# Patient Record
Sex: Female | Born: 1960
Health system: Southern US, Community
[De-identification: ages and names within clinical notes are randomized; demographics above are authoritative.]

## PROBLEM LIST (undated history)

## (undated) DIAGNOSIS — I10 Essential (primary) hypertension: Secondary | ICD-10-CM

## (undated) DIAGNOSIS — R0981 Nasal congestion: Secondary | ICD-10-CM

## (undated) DIAGNOSIS — R338 Other retention of urine: Secondary | ICD-10-CM

## (undated) DIAGNOSIS — Z973 Presence of spectacles and contact lenses: Secondary | ICD-10-CM

## (undated) DIAGNOSIS — I82401 Acute embolism and thrombosis of unspecified deep veins of right lower extremity: Secondary | ICD-10-CM

## (undated) DIAGNOSIS — K439 Ventral hernia without obstruction or gangrene: Secondary | ICD-10-CM

## (undated) DIAGNOSIS — M1990 Unspecified osteoarthritis, unspecified site: Secondary | ICD-10-CM

## (undated) DIAGNOSIS — Z862 Personal history of diseases of the blood and blood-forming organs and certain disorders involving the immune mechanism: Secondary | ICD-10-CM

## (undated) DIAGNOSIS — Z86711 Personal history of pulmonary embolism: Secondary | ICD-10-CM

## (undated) DIAGNOSIS — R350 Frequency of micturition: Secondary | ICD-10-CM

## (undated) DIAGNOSIS — J189 Pneumonia, unspecified organism: Secondary | ICD-10-CM

## (undated) DIAGNOSIS — Z86718 Personal history of other venous thrombosis and embolism: Secondary | ICD-10-CM

## (undated) HISTORY — PX: ESOPHAGOGASTRODUODENOSCOPY: SHX1529

## (undated) HISTORY — DX: Acute embolism and thrombosis of unspecified deep veins of right lower extremity: I82.401

## (undated) HISTORY — DX: Other retention of urine: R33.8

## (undated) HISTORY — PX: ARTHROSCOPY KNEE W/ DRILLING: SUR92

## (undated) HISTORY — PX: COLONOSCOPY: SHX174

## (undated) HISTORY — DX: Personal history of pulmonary embolism: Z86.711

---

## 1997-06-22 ENCOUNTER — Emergency Department (HOSPITAL_COMMUNITY): Admission: EM | Admit: 1997-06-22 | Discharge: 1997-06-22 | Payer: Self-pay | Admitting: Emergency Medicine

## 1999-05-02 ENCOUNTER — Emergency Department (HOSPITAL_COMMUNITY): Admission: EM | Admit: 1999-05-02 | Discharge: 1999-05-02 | Payer: Self-pay | Admitting: Emergency Medicine

## 1999-05-02 ENCOUNTER — Encounter: Payer: Self-pay | Admitting: Emergency Medicine

## 2000-06-28 ENCOUNTER — Encounter: Payer: Self-pay | Admitting: Family Medicine

## 2000-06-28 ENCOUNTER — Encounter: Admission: RE | Admit: 2000-06-28 | Discharge: 2000-06-28 | Payer: Self-pay | Admitting: Family Medicine

## 2001-01-13 ENCOUNTER — Emergency Department (HOSPITAL_COMMUNITY): Admission: EM | Admit: 2001-01-13 | Discharge: 2001-01-13 | Payer: Self-pay | Admitting: Emergency Medicine

## 2001-04-14 ENCOUNTER — Emergency Department (HOSPITAL_COMMUNITY): Admission: EM | Admit: 2001-04-14 | Discharge: 2001-04-14 | Payer: Self-pay | Admitting: Emergency Medicine

## 2001-04-14 ENCOUNTER — Encounter: Payer: Self-pay | Admitting: Emergency Medicine

## 2001-06-09 ENCOUNTER — Ambulatory Visit (HOSPITAL_BASED_OUTPATIENT_CLINIC_OR_DEPARTMENT_OTHER): Admission: RE | Admit: 2001-06-09 | Discharge: 2001-06-09 | Payer: Self-pay | Admitting: Orthopedic Surgery

## 2003-03-26 ENCOUNTER — Encounter: Admission: RE | Admit: 2003-03-26 | Discharge: 2003-03-26 | Payer: Self-pay | Admitting: Family Medicine

## 2003-04-15 ENCOUNTER — Other Ambulatory Visit: Admission: RE | Admit: 2003-04-15 | Discharge: 2003-04-15 | Payer: Self-pay | Admitting: Family Medicine

## 2004-03-28 ENCOUNTER — Encounter: Admission: RE | Admit: 2004-03-28 | Discharge: 2004-03-28 | Payer: Self-pay | Admitting: Family Medicine

## 2005-04-06 ENCOUNTER — Encounter: Admission: RE | Admit: 2005-04-06 | Discharge: 2005-04-06 | Payer: Self-pay | Admitting: Family Medicine

## 2005-07-04 ENCOUNTER — Other Ambulatory Visit: Admission: RE | Admit: 2005-07-04 | Discharge: 2005-07-04 | Payer: Self-pay | Admitting: Family Medicine

## 2006-03-09 ENCOUNTER — Emergency Department (HOSPITAL_COMMUNITY): Admission: EM | Admit: 2006-03-09 | Discharge: 2006-03-09 | Payer: Self-pay | Admitting: Emergency Medicine

## 2006-03-14 ENCOUNTER — Encounter: Admission: RE | Admit: 2006-03-14 | Discharge: 2006-03-14 | Payer: Self-pay | Admitting: Physician Assistant

## 2006-04-24 ENCOUNTER — Encounter: Admission: RE | Admit: 2006-04-24 | Discharge: 2006-04-24 | Payer: Self-pay | Admitting: Family Medicine

## 2006-05-08 ENCOUNTER — Encounter: Admission: RE | Admit: 2006-05-08 | Discharge: 2006-05-08 | Payer: Self-pay | Admitting: Family Medicine

## 2006-08-01 ENCOUNTER — Encounter: Admission: RE | Admit: 2006-08-01 | Discharge: 2006-08-01 | Payer: Self-pay | Admitting: Family Medicine

## 2006-08-05 ENCOUNTER — Other Ambulatory Visit: Admission: RE | Admit: 2006-08-05 | Discharge: 2006-08-05 | Payer: Self-pay | Admitting: Family Medicine

## 2006-11-07 ENCOUNTER — Encounter: Admission: RE | Admit: 2006-11-07 | Discharge: 2006-11-07 | Payer: Self-pay | Admitting: Family Medicine

## 2007-02-24 ENCOUNTER — Ambulatory Visit (HOSPITAL_BASED_OUTPATIENT_CLINIC_OR_DEPARTMENT_OTHER): Admission: RE | Admit: 2007-02-24 | Discharge: 2007-02-24 | Payer: Self-pay | Admitting: Orthopedic Surgery

## 2007-04-10 ENCOUNTER — Encounter: Admission: RE | Admit: 2007-04-10 | Discharge: 2007-04-10 | Payer: Self-pay | Admitting: Family Medicine

## 2007-07-09 ENCOUNTER — Encounter: Admission: RE | Admit: 2007-07-09 | Discharge: 2007-07-09 | Payer: Self-pay | Admitting: Family Medicine

## 2007-08-20 ENCOUNTER — Other Ambulatory Visit: Admission: RE | Admit: 2007-08-20 | Discharge: 2007-08-20 | Payer: Self-pay | Admitting: Family Medicine

## 2008-03-28 ENCOUNTER — Emergency Department (HOSPITAL_COMMUNITY): Admission: EM | Admit: 2008-03-28 | Discharge: 2008-03-28 | Payer: Self-pay | Admitting: Family Medicine

## 2008-06-19 ENCOUNTER — Emergency Department (HOSPITAL_COMMUNITY): Admission: EM | Admit: 2008-06-19 | Discharge: 2008-06-19 | Payer: Self-pay | Admitting: Family Medicine

## 2008-07-20 ENCOUNTER — Encounter: Admission: RE | Admit: 2008-07-20 | Discharge: 2008-07-20 | Payer: Self-pay | Admitting: Family Medicine

## 2008-08-19 ENCOUNTER — Other Ambulatory Visit: Admission: RE | Admit: 2008-08-19 | Discharge: 2008-08-19 | Payer: Self-pay | Admitting: Family Medicine

## 2009-05-15 ENCOUNTER — Emergency Department (HOSPITAL_COMMUNITY): Admission: EM | Admit: 2009-05-15 | Discharge: 2009-05-15 | Payer: Self-pay | Admitting: Family Medicine

## 2009-07-25 ENCOUNTER — Encounter: Admission: RE | Admit: 2009-07-25 | Discharge: 2009-07-25 | Payer: Self-pay | Admitting: Family Medicine

## 2009-08-22 ENCOUNTER — Other Ambulatory Visit: Admission: RE | Admit: 2009-08-22 | Discharge: 2009-08-22 | Payer: Self-pay | Admitting: Family Medicine

## 2010-01-06 ENCOUNTER — Emergency Department (HOSPITAL_COMMUNITY)
Admission: EM | Admit: 2010-01-06 | Discharge: 2010-01-06 | Payer: Self-pay | Source: Home / Self Care | Admitting: Emergency Medicine

## 2010-03-02 ENCOUNTER — Emergency Department (HOSPITAL_COMMUNITY)
Admission: EM | Admit: 2010-03-02 | Discharge: 2010-03-03 | Payer: Self-pay | Source: Home / Self Care | Admitting: Emergency Medicine

## 2010-03-02 LAB — URINALYSIS, ROUTINE W REFLEX MICROSCOPIC
Bilirubin Urine: NEGATIVE
Hgb urine dipstick: NEGATIVE
Ketones, ur: NEGATIVE mg/dL
Nitrite: POSITIVE — AB
Protein, ur: NEGATIVE mg/dL
Specific Gravity, Urine: 1.018 (ref 1.005–1.030)
Urine Glucose, Fasting: NEGATIVE mg/dL
Urobilinogen, UA: 1 mg/dL (ref 0.0–1.0)
pH: 6.5 (ref 5.0–8.0)

## 2010-03-02 LAB — CBC
HCT: 34.2 % — ABNORMAL LOW (ref 36.0–46.0)
Hemoglobin: 10.7 g/dL — ABNORMAL LOW (ref 12.0–15.0)
MCH: 28.9 pg (ref 26.0–34.0)
MCHC: 31.3 g/dL (ref 30.0–36.0)
MCV: 92.4 fL (ref 78.0–100.0)
Platelets: 191 10*3/uL (ref 150–400)
RBC: 3.7 MIL/uL — ABNORMAL LOW (ref 3.87–5.11)
RDW: 12.3 % (ref 11.5–15.5)
WBC: 6.8 10*3/uL (ref 4.0–10.5)

## 2010-03-02 LAB — BASIC METABOLIC PANEL
BUN: 11 mg/dL (ref 6–23)
CO2: 25 mEq/L (ref 19–32)
Calcium: 8.8 mg/dL (ref 8.4–10.5)
Chloride: 106 mEq/L (ref 96–112)
Creatinine, Ser: 0.76 mg/dL (ref 0.4–1.2)
GFR calc Af Amer: >60 mL/min (ref >60)
GFR calc non Af Amer: >60 mL/min (ref >60)
Glucose, Bld: 96 mg/dL (ref 70–99)
Potassium: 3.9 mEq/L (ref 3.5–5.1)
Sodium: 138 mEq/L (ref 135–145)

## 2010-03-02 LAB — DIFFERENTIAL
Basophils Absolute: 0.1 10*3/uL (ref 0.0–0.1)
Basophils Relative: 1 % (ref 0–1)
Eosinophils Absolute: 0.1 10*3/uL (ref 0.0–0.7)
Eosinophils Relative: 2 % (ref 0–5)
Lymphocytes Relative: 42 % (ref 12–46)
Lymphs Abs: 2.9 10*3/uL (ref 0.7–4.0)
Monocytes Absolute: 0.6 10*3/uL (ref 0.1–1.0)
Monocytes Relative: 8 % (ref 3–12)
Neutro Abs: 3.2 10*3/uL (ref 1.7–7.7)
Neutrophils Relative %: 47 % (ref 43–77)

## 2010-03-02 LAB — POCT CARDIAC MARKERS
CKMB, poc: <1 ng/mL — ABNORMAL LOW (ref 1.0–8.0)
Myoglobin, poc: 32.5 ng/mL (ref 12–200)
Troponin i, poc: <0.05 ng/mL (ref 0.00–0.09)

## 2010-03-02 LAB — POCT PREGNANCY, URINE: Preg Test, Ur: NEGATIVE

## 2010-03-02 LAB — URINE MICROSCOPIC-ADD ON

## 2010-03-03 LAB — D-DIMER, QUANTITATIVE: D-Dimer, Quant: 0.51 ug/mL-FEU — ABNORMAL HIGH (ref 0.00–0.48)

## 2010-04-18 LAB — POCT URINALYSIS DIPSTICK
Bilirubin Urine: NEGATIVE
Glucose, UA: NEGATIVE mg/dL
Hgb urine dipstick: NEGATIVE
Ketones, ur: NEGATIVE mg/dL
Nitrite: NEGATIVE
Protein, ur: NEGATIVE mg/dL
Specific Gravity, Urine: 1.025 (ref 1.005–1.030)
Urobilinogen, UA: 0.2 mg/dL (ref 0.0–1.0)
pH: 6.5 (ref 5.0–8.0)

## 2010-04-18 LAB — POCT PREGNANCY, URINE: Preg Test, Ur: NEGATIVE

## 2010-04-26 LAB — POCT URINALYSIS DIP (DEVICE)
Glucose, UA: NEGATIVE mg/dL
Ketones, ur: NEGATIVE mg/dL
Nitrite: POSITIVE — AB
Protein, ur: 30 mg/dL — AB
Specific Gravity, Urine: 1.02 (ref 1.005–1.030)
Urobilinogen, UA: 4 mg/dL — ABNORMAL HIGH (ref 0.0–1.0)
pH: 5.5 (ref 5.0–8.0)

## 2010-04-26 LAB — POCT PREGNANCY, URINE: Preg Test, Ur: NEGATIVE

## 2010-05-13 ENCOUNTER — Inpatient Hospital Stay (INDEPENDENT_AMBULATORY_CARE_PROVIDER_SITE_OTHER)
Admission: RE | Admit: 2010-05-13 | Discharge: 2010-05-13 | Disposition: A | Discharge status: Home / Self Care | Payer: BLUE CROSS/BLUE SHIELD | Source: Ambulatory Visit | Attending: Emergency Medicine | Admitting: Emergency Medicine

## 2010-05-13 DIAGNOSIS — J029 Acute pharyngitis, unspecified: Secondary | ICD-10-CM

## 2010-05-13 DIAGNOSIS — B9789 Other viral agents as the cause of diseases classified elsewhere: Secondary | ICD-10-CM

## 2010-05-13 DIAGNOSIS — J019 Acute sinusitis, unspecified: Secondary | ICD-10-CM

## 2010-05-13 LAB — POCT RAPID STREP A (OFFICE): Streptococcus, Group A Screen (Direct): NEGATIVE

## 2010-06-20 NOTE — Op Note (Signed)
NAMEKASSITY, WOODSON              ACCOUNT NO.:  [id number]   MEDICAL RECORD NO.:  [id number]          PATIENT TYPE:  AMB   LOCATION:  DSC                          FACILITY:  [facility]   PHYSICIAN:  Feliberto Gottron. Turner Daniels, M.D.   DATE OF BIRTH:  August 28, 1960   DATE OF PROCEDURE:  02/24/2007  DATE OF DISCHARGE:                               OPERATIVE REPORT   PREOPERATIVE DIAGNOSIS:  Left knee medial meniscal tear, possible  lateral meniscal tear.   POSTOPERATIVE DIAGNOSIS:  Left knee medial meniscal tear, possible  lateral meniscal tear.  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   PROCEDURE:  Arthroscopic partial medial and lateral meniscectomies;  debridement of chondromalacia trochlea focal grade 4, medial and lateral  femoral condyle grade 3.   SURGEON:  Feliberto Gottron. Turner Daniels, M.D.   FIRST ASSISTANT:  None.   ANESTHETIC:  General LMA.   ESTIMATED BLOOD LOSS:  Minimal.   FLUID REPLACEMENT:  900 mL crystalloid.   DRAINS PLACED:  None.   TOURNIQUET TIME:  None.   INDICATIONS FOR PROCEDURE:  A 50 year old woman with symptomatic  catching, popping and pain in her left knee, who has failed conservative  treatment and desires elective arthroscopic evaluation and treatment for  presumed medial and lateral meniscal tears.  The risks and benefits of  surgery discussed, questions answered.   DESCRIPTION OF PROCEDURE:  The patient identified by armband, taken the  operating room at [facility], appropriate anesthetic  monitors were attached and general LMA anesthesia induced with the  patient in the supine position.  A lateral post was applied to the table  and the left lower extremity prepped and draped in the usual sterile  fashion from the ankle to the midthigh.  The inferomedial and  inferolateral peripatellar regions were then  infiltrated with 3-4 mL of  0.5% Marcaine and epinephrine solution and another 10 mL placed into the  joint with an 18-gauge needle.  Using a #11 blade after doing an  operative time-out, the standard inferomedial and inferolateral  peripatellar portals were then made allowing introduction of the  arthroscope through the inferolateral portal and the outflow through the  inferomedial portal.  The patella had very mild chondromalacia as did  the superior trochlea.  The inferior trochlea near the notch had focal  grade 4 and some grade 3 flap tears that were debrided back to stable  margins using a 3.5 gator sucker shaver.  Moving to the medial  compartment the patient had an area of focal grade 4 chondromalacia near  the medial meniscus medial horn to the medial tibial plateau and the  medial femoral condyle.  The meniscus itself was shredded and debrided  back to a stable margin with the 3.5 gator sucker shaver as well as a  straight biter.  The ACL and the PCL were intact.  On the lateral side  the patient had extensive degenerative tearing of the lateral meniscus  debrided  back to a stable margin, grade 3 chondromalacia of the lateral  femoral condyle was lightly debrided as was the medial femoral condyle.  The gutters were cleared medially and laterally, and the knee irrigated  out with normal saline solution.  At this point the arthroscopic  instruments were removed; a dressing of Xeroform, 4x4 dressing sponges,  Webril and an Ace wrap were applied.  The patient awakened and taken to  the recovery room without difficulty.      Feliberto Gottron. Turner Daniels, M.D.  Electronically Signed     FJR/MEDQ  D:  02/24/2007  T:  02/25/2007  Job:  [id number]

## 2010-06-23 NOTE — Op Note (Signed)
[place]  Patient:    Brandi Rivas, Brandi Rivas Visit Number: [id number] MRN: [id number]          Service Type: DSU Location: [facility] Attending Physician:  Alinda Deem Dictated by:   Alinda Deem, M.D. Proc. Date: 06/09/01 Admit Date:  06/09/2001                             Operative Report  PREOPERATIVE DIAGNOSIS:  Chondromalacia of the right knee with posterior horn medial meniscal tear.  POSTOPERATIVE DIAGNOSIS:  Chondromalacia of the right knee with posterior horn medial meniscal tear.  OPERATION PERFORMED:  Right knee arthroscopic debridement of grade 2 to 3 chondromalacia of the medial femoral condyle, grade 3 to 4 of the trochlea, partial arthroscopic medial meniscectomy.  SURGEON:  Alinda Deem, M.D.  ASSISTANT:  Dorthula Matas, P.A.-C.  ANESTHESIA:  General LMA.  ESTIMATED BLOOD LOSS:  Minimal.  FLUID REPLACEMENT:  800 cc of crystalloid.  DRAINS:  None.  TOURNIQUET TIME:  None.  INDICATIONS FOR PROCEDURE:  The patient is a 50 year old woman who underwent arthroscopic debridement of chondromalacia by me six years ago and now has recurrent pain over the last few weeks that is increasing.  MRI scan showed tricompartmental arthritis, posterior horn medial meniscal tear and she desires arthroscopic evaluation and treatment of her knee pain which is primarily posterior to posteromedial with a catching sensation.  DESCRIPTION OF PROCEDURE:  The patient was identified by arm band and taken to the operating room at [facility] where the appropriate anesthetic monitors were attached and general endotracheal anesthesia induced with the patient in the supine position.  A lateral post applied to the table and the right lower extremity prepped and draped in the usual sterile fashion from the ankle to the midthigh.  The inferomedial and inferolateral peripatellar portal regions were infiltrated with 3 to 4 cc  of 0.5% Marcaine with epinephrine solution.  Standard portals were then made with a #11 blade allowing introduction of the arthroscope through the inferolateral portal and the outflow through the inferomedial portal.  The patella was in relatively good shape with minimal chondromalacia.  The trochlea had grade 3 to 4 chondromalacia focal with flap tears.  This was debrided on the medial side. There was generalized grade 2 to 3 chondromalacia of the medial femoral condyle which was likewise debrided.  The root of the posterior horn of the medial meniscus had some degenerative tearing which was debrided back to stable margins with a 3.5 gator sucker shaver and small cartilaginous loose bodies were also removed.  The ACL and the PCL were intact.  On the lateral side there was some minimal fraying of the lateral meniscus that was incidentally debrided.  The knee was then washed out with normal saline solution.  The arthroscopic instruments were removed.  A dressing of Xeroform, 4 x 4 dressing sponges, Webril and an Ace wrap applied.  The patient was awakened and taken to the recovery room without difficulty. Dictated by:   Alinda Deem, M.D. Attending Physician:  Alinda Deem DD:  06/09/01 TD:  06/09/01 Job: [id number]

## 2010-06-26 ENCOUNTER — Other Ambulatory Visit: Payer: Self-pay | Admitting: Family Medicine

## 2010-06-26 DIAGNOSIS — Z1231 Encounter for screening mammogram for malignant neoplasm of breast: Secondary | ICD-10-CM

## 2010-07-31 ENCOUNTER — Ambulatory Visit: Payer: BLUE CROSS/BLUE SHIELD

## 2010-07-31 ENCOUNTER — Ambulatory Visit
Admission: RE | Admit: 2010-07-31 | Discharge: 2010-07-31 | Disposition: A | Discharge status: Home / Self Care | Payer: BLUE CROSS/BLUE SHIELD | Source: Ambulatory Visit | Attending: Family Medicine | Admitting: Family Medicine

## 2010-07-31 DIAGNOSIS — Z1231 Encounter for screening mammogram for malignant neoplasm of breast: Secondary | ICD-10-CM

## 2010-09-29 ENCOUNTER — Other Ambulatory Visit (HOSPITAL_COMMUNITY)
Admission: RE | Admit: 2010-09-29 | Discharge: 2010-09-29 | Disposition: A | Discharge status: Home / Self Care | Payer: BC Managed Care – PPO | Source: Ambulatory Visit | Attending: Family Medicine | Admitting: Family Medicine

## 2010-09-29 ENCOUNTER — Other Ambulatory Visit: Payer: Self-pay | Admitting: Family Medicine

## 2010-09-29 DIAGNOSIS — Z124 Encounter for screening for malignant neoplasm of cervix: Secondary | ICD-10-CM | POA: Insufficient documentation

## 2010-10-27 LAB — POCT HEMOGLOBIN-HEMACUE: Hemoglobin: 12.8

## 2011-01-09 ENCOUNTER — Encounter: Payer: Self-pay | Admitting: Emergency Medicine

## 2011-01-09 ENCOUNTER — Emergency Department (HOSPITAL_COMMUNITY): Payer: BC Managed Care – PPO

## 2011-01-09 ENCOUNTER — Inpatient Hospital Stay (HOSPITAL_COMMUNITY)
Admission: EM | Admit: 2011-01-09 | Discharge: 2011-01-12 | DRG: 089 | Disposition: A | Discharge status: Home / Self Care | Payer: BC Managed Care – PPO | Attending: Internal Medicine | Admitting: Internal Medicine

## 2011-01-09 ENCOUNTER — Other Ambulatory Visit: Payer: Self-pay

## 2011-01-09 DIAGNOSIS — N390 Urinary tract infection, site not specified: Secondary | ICD-10-CM

## 2011-01-09 DIAGNOSIS — Z23 Encounter for immunization: Secondary | ICD-10-CM

## 2011-01-09 DIAGNOSIS — J1100 Influenza due to unidentified influenza virus with unspecified type of pneumonia: Principal | ICD-10-CM | POA: Diagnosis present

## 2011-01-09 DIAGNOSIS — D509 Iron deficiency anemia, unspecified: Secondary | ICD-10-CM | POA: Diagnosis present

## 2011-01-09 DIAGNOSIS — D72819 Decreased white blood cell count, unspecified: Secondary | ICD-10-CM | POA: Diagnosis present

## 2011-01-09 DIAGNOSIS — Z79899 Other long term (current) drug therapy: Secondary | ICD-10-CM

## 2011-01-09 DIAGNOSIS — E871 Hypo-osmolality and hyponatremia: Secondary | ICD-10-CM | POA: Diagnosis present

## 2011-01-09 DIAGNOSIS — J189 Pneumonia, unspecified organism: Secondary | ICD-10-CM

## 2011-01-09 DIAGNOSIS — E876 Hypokalemia: Secondary | ICD-10-CM | POA: Diagnosis present

## 2011-01-09 HISTORY — DX: Pneumonia, unspecified organism: J18.9

## 2011-01-09 LAB — URINALYSIS, ROUTINE W REFLEX MICROSCOPIC
Bilirubin Urine: NEGATIVE
Glucose, UA: NEGATIVE mg/dL
Hgb urine dipstick: NEGATIVE
Ketones, ur: NEGATIVE mg/dL
Nitrite: POSITIVE — AB
Protein, ur: NEGATIVE mg/dL
Specific Gravity, Urine: 1.022 (ref 1.005–1.030)
Urobilinogen, UA: 1 mg/dL (ref 0.0–1.0)
pH: 6.5 (ref 5.0–8.0)

## 2011-01-09 LAB — CBC
HCT: 35.1 % — ABNORMAL LOW (ref 36.0–46.0)
Hemoglobin: 11 g/dL — ABNORMAL LOW (ref 12.0–15.0)
MCH: 27.9 pg (ref 26.0–34.0)
MCHC: 31.3 g/dL (ref 30.0–36.0)
MCV: 89.1 fL (ref 78.0–100.0)
Platelets: 172 10*3/uL (ref 150–400)
RBC: 3.94 MIL/uL (ref 3.87–5.11)
RDW: 12.7 % (ref 11.5–15.5)
WBC: 4.5 10*3/uL (ref 4.0–10.5)

## 2011-01-09 LAB — BASIC METABOLIC PANEL
BUN: 11 mg/dL (ref 6–23)
CO2: 23 mEq/L (ref 19–32)
Calcium: 8.6 mg/dL (ref 8.4–10.5)
Chloride: 101 mEq/L (ref 96–112)
Creatinine, Ser: 0.66 mg/dL (ref 0.50–1.10)
GFR calc Af Amer: >90 mL/min (ref >90)
GFR calc non Af Amer: >90 mL/min (ref >90)
Glucose, Bld: 94 mg/dL (ref 70–99)
Potassium: 3.4 mEq/L — ABNORMAL LOW (ref 3.5–5.1)
Sodium: 132 mEq/L — ABNORMAL LOW (ref 135–145)

## 2011-01-09 LAB — URINE MICROSCOPIC-ADD ON

## 2011-01-09 MED ORDER — DEXTROSE 5 % IV SOLN
500.0000 mg | Freq: Once | INTRAVENOUS | Status: AC
  Administered 2011-01-09: 500 mg via INTRAVENOUS
  Filled 2011-01-09: qty 500

## 2011-01-09 MED ORDER — DEXTROSE 5 % IV SOLN
1.0000 g | Freq: Once | INTRAVENOUS | Status: AC
  Administered 2011-01-09: 1 g via INTRAVENOUS
  Filled 2011-01-09: qty 10

## 2011-01-09 MED ORDER — SODIUM CHLORIDE 0.9 % IV BOLUS (SEPSIS)
1000.0000 mL | Freq: Once | INTRAVENOUS | Status: AC
  Administered 2011-01-09: 1000 mL via INTRAVENOUS

## 2011-01-09 MED ORDER — OXYCODONE-ACETAMINOPHEN 5-325 MG PO TABS
1.0000 | ORAL_TABLET | Freq: Once | ORAL | Status: AC
  Administered 2011-01-09: 1 via ORAL
  Filled 2011-01-09: qty 1

## 2011-01-09 MED ORDER — KETOROLAC TROMETHAMINE 30 MG/ML IJ SOLN
30.0000 mg | Freq: Once | INTRAMUSCULAR | Status: AC
  Administered 2011-01-09: 30 mg via INTRAVENOUS
  Filled 2011-01-09: qty 1

## 2011-01-09 NOTE — ED Provider Notes (Addendum)
History     CSN: [id number] Arrival date & time: 01/09/2011  2:27 PM   First MD Initiated Contact with Patient 01/09/11 1625      Chief Complaint  Patient presents with  . Shortness of Breath   pleasant 50 year old female with no past medical history presents with a new cough. Since this morning. Since the cough began. She has had persistent pain across her chest, worsens with cough only. The cough is nonproductive. She is also a stent fever, as well as headaches, and some mild sore throat. In addition, the patient is having urinary frequency and low back pain, which has been there for one week. Some nausea with no vomiting. Patient does work at [employer]. Unknown sick contacts recentlyhaving body  (Consider location/radiation/quality/duration/timing/severity/associated sxs/prior treatment) HPI  No past medical history on file.  No past surgical history on file.  No family history on file.  History  Substance Use Topics  . Smoking status: Not on file  . Smokeless tobacco: Not on file  . Alcohol Use: Not on file    OB History    No data available      Review of Systems  All other systems reviewed and are negative.    Allergies  Review of patient's allergies indicates no known allergies.  Home Medications  No current outpatient prescriptions on file.  BP 119/79  Pulse 104  Temp(Src) 102.2 F (39 C) (Oral)  Resp 24  Ht 5\' 9"  (1.753 m)  Wt 225 lb (102.059 kg)  BMI 33.23 kg/m2  SpO2 98%  LMP 12/21/2010  Physical Exam  Nursing note and vitals reviewed. Constitutional: She is oriented to person, place, and time. She appears well-developed and well-nourished.  HENT:  Head: Normocephalic and atraumatic.  Eyes: Conjunctivae and EOM are normal. Pupils are equal, round, and reactive to light.  Neck: Neck supple.  Cardiovascular: Normal rate and regular rhythm.  Exam reveals no gallop and no friction rub.   No murmur heard. Pulmonary/Chest: Breath sounds normal.  She has no wheezes. She has no rales. She exhibits no tenderness.       Persistent coughing, no wheezes, rales, or rhonchi.  Abdominal: Soft. Bowel sounds are normal. She exhibits no distension. There is no tenderness. There is no rebound and no guarding.  Musculoskeletal: Normal range of motion.  Neurological: She is alert and oriented to person, place, and time. No cranial nerve deficit. Coordination normal.  Skin: Skin is warm and dry. No rash noted.  Psychiatric: She has a normal mood and affect.    ED Course  Procedures (including critical care time)  Labs Reviewed - No data to display No results found.   No diagnosis found.    MDM  Pt is seen and examined;  Initial history and physical completed.  Will follLikely due to influenza-like illness. 102. Temperature is noted. Will obtain a chest x-ray to rule out pneumonia. Also, checking urinalysis.ow.          Makendra Vigeant A. Gilmar Bua, MD 01/09/11 1633  5:52 PM Results for orders placed during the hospital encounter of 01/09/11  URINALYSIS, ROUTINE W REFLEX MICROSCOPIC      Component Value Range   Color, Urine YELLOW  YELLOW    APPearance CLEAR  CLEAR    Specific Gravity, Urine 1.022  1.005 - 1.030    pH 6.5  5.0 - 8.0    Glucose, UA NEGATIVE  NEGATIVE (mg/dL)   Hgb urine dipstick NEGATIVE  NEGATIVE    Bilirubin Urine NEGATIVE  NEGATIVE    Ketones, ur NEGATIVE  NEGATIVE (mg/dL)   Protein, ur NEGATIVE  NEGATIVE (mg/dL)   Urobilinogen, UA 1.0  0.0 - 1.0 (mg/dL)   Nitrite POSITIVE (*) NEGATIVE    Leukocytes, UA SMALL (*) NEGATIVE   URINE MICROSCOPIC-ADD ON      Component Value Range   Squamous Epithelial / LPF RARE  RARE    WBC, UA 0-2  <3 (WBC/hpf)   Bacteria, UA RARE  RARE    Dg Chest 2 View  01/09/2011  *RADIOLOGY REPORT*  Clinical Data: Fever, chest pain, cough  CHEST - 2 VIEW  Comparison: 03/02/2010  Findings: Mild bibasilar airspace disease.  Upper lobe airspace disease, best seen on the lateral view was not  present previously and may represent pneumonia.  Negative for heart failure or effusion.  IMPRESSION: Bilateral airspace disease in the bases and upper lobe.  Possible pneumonia.  Original Report Authenticated By: Camelia Phenes, M.D.     Xrays or radiologic studies  reviewed by myself, interpreted by Radiologist.       Lorelle Gibbs. Patrica Duel, MD 01/10/11 1341

## 2011-01-09 NOTE — ED Notes (Signed)
Pt c/o sob, non-productive cough, chest pain assoc. W/cough starting this am. Pt also c/o frequent urination and intermittent lower back pain x1 wk

## 2011-01-09 NOTE — ED Notes (Signed)
Pt presents with onset of midsternal chest pain that has been intermittent x 1 week.  Pt reports pain worsens with cough.  Pt reports cough in nonproductive.  Pt reports urinary frequency and low back pain x 1 week that has been intermittent as well. -nausea

## 2011-01-10 LAB — COMPREHENSIVE METABOLIC PANEL
ALT: 10 U/L (ref 0–35)
AST: 14 U/L (ref 0–37)
Albumin: 3.1 g/dL — ABNORMAL LOW (ref 3.5–5.2)
Alkaline Phosphatase: 40 U/L (ref 39–117)
BUN: 9 mg/dL (ref 6–23)
CO2: 21 mEq/L (ref 19–32)
Calcium: 8.2 mg/dL — ABNORMAL LOW (ref 8.4–10.5)
Chloride: 103 mEq/L (ref 96–112)
Creatinine, Ser: 0.64 mg/dL (ref 0.50–1.10)
GFR calc Af Amer: >90 mL/min (ref >90)
GFR calc non Af Amer: >90 mL/min (ref >90)
Glucose, Bld: 94 mg/dL (ref 70–99)
Potassium: 3.2 mEq/L — ABNORMAL LOW (ref 3.5–5.1)
Sodium: 132 mEq/L — ABNORMAL LOW (ref 135–145)
Total Bilirubin: 1.4 mg/dL — ABNORMAL HIGH (ref 0.3–1.2)
Total Protein: 6.4 g/dL (ref 6.0–8.3)

## 2011-01-10 LAB — IRON AND TIBC
Iron: 20 ug/dL — ABNORMAL LOW (ref 42–135)
Saturation Ratios: 6 % — ABNORMAL LOW (ref 20–55)
TIBC: 314 ug/dL (ref 250–470)
UIBC: 294 ug/dL (ref 125–400)

## 2011-01-10 LAB — INFLUENZA PANEL BY PCR (TYPE A & B)
H1N1 flu by pcr: NOT DETECTED
Influenza A By PCR: POSITIVE — AB
Influenza B By PCR: NEGATIVE

## 2011-01-10 LAB — CBC
HCT: 32.2 % — ABNORMAL LOW (ref 36.0–46.0)
Hemoglobin: 10.4 g/dL — ABNORMAL LOW (ref 12.0–15.0)
MCH: 28.4 pg (ref 26.0–34.0)
MCHC: 32.3 g/dL (ref 30.0–36.0)
MCV: 88 fL (ref 78.0–100.0)
Platelets: 160 10*3/uL (ref 150–400)
RBC: 3.66 MIL/uL — ABNORMAL LOW (ref 3.87–5.11)
RDW: 12.7 % (ref 11.5–15.5)
WBC: 2.9 10*3/uL — ABNORMAL LOW (ref 4.0–10.5)

## 2011-01-10 LAB — RETICULOCYTES
RBC.: 3.58 MIL/uL — ABNORMAL LOW (ref 3.87–5.11)
Retic Count, Absolute: 35.8 10*3/uL (ref 19.0–186.0)
Retic Ct Pct: 1 % (ref 0.4–3.1)

## 2011-01-10 LAB — FERRITIN: Ferritin: 63 ng/mL (ref 10–291)

## 2011-01-10 LAB — VITAMIN B12: Vitamin B-12: 534 pg/mL (ref 211–911)

## 2011-01-10 LAB — FOLATE: Folate: 13.6 ng/mL

## 2011-01-10 LAB — GLUCOSE, CAPILLARY: Glucose-Capillary: 161 mg/dL — ABNORMAL HIGH (ref 70–99)

## 2011-01-10 MED ORDER — PNEUMOCOCCAL VAC POLYVALENT 25 MCG/0.5ML IJ INJ
0.5000 mL | INJECTION | Freq: Once | INTRAMUSCULAR | Status: AC
  Administered 2011-01-10: 0.5 mL via INTRAMUSCULAR
  Filled 2011-01-10: qty 0.5

## 2011-01-10 MED ORDER — ONDANSETRON HCL 4 MG PO TABS: 4.0000 mg | ORAL_TABLET | Freq: Four times a day (QID) | ORAL | Status: DC | PRN

## 2011-01-10 MED ORDER — OXYCODONE HCL 5 MG PO TABS
5.0000 mg | ORAL_TABLET | ORAL | Status: DC | PRN
  Administered 2011-01-10 – 2011-01-11 (×3): 5 mg via ORAL
  Filled 2011-01-10 (×3): qty 1

## 2011-01-10 MED ORDER — ACETAMINOPHEN 325 MG PO TABS
650.0000 mg | ORAL_TABLET | Freq: Four times a day (QID) | ORAL | Status: DC | PRN
  Administered 2011-01-10 – 2011-01-12 (×4): 650 mg via ORAL
  Filled 2011-01-10 (×4): qty 2

## 2011-01-10 MED ORDER — INFLUENZA VIRUS VACC SPLIT PF IM SUSP
0.5000 mL | Freq: Once | INTRAMUSCULAR | Status: AC
  Administered 2011-01-10: 0.5 mL via INTRAMUSCULAR
  Filled 2011-01-10: qty 0.5

## 2011-01-10 MED ORDER — SODIUM CHLORIDE 0.9 % IV SOLN
INTRAVENOUS | Status: DC
  Administered 2011-01-10 – 2011-01-12 (×5): via INTRAVENOUS

## 2011-01-10 MED ORDER — ONDANSETRON HCL 4 MG/2ML IJ SOLN: 4.0000 mg | Freq: Four times a day (QID) | INTRAMUSCULAR | Status: DC | PRN

## 2011-01-10 MED ORDER — DEXTROSE 5 % IV SOLN
1.0000 g | INTRAVENOUS | Status: DC
  Administered 2011-01-10 – 2011-01-12 (×3): 1 g via INTRAVENOUS
  Filled 2011-01-10 (×4): qty 10

## 2011-01-10 MED ORDER — ACETAMINOPHEN 650 MG RE SUPP: 650.0000 mg | Freq: Four times a day (QID) | RECTAL | Status: DC | PRN

## 2011-01-10 MED ORDER — POTASSIUM CHLORIDE CRYS ER 20 MEQ PO TBCR
40.0000 meq | EXTENDED_RELEASE_TABLET | Freq: Once | ORAL | Status: AC
  Administered 2011-01-10: 40 meq via ORAL
  Filled 2011-01-10 (×2): qty 2

## 2011-01-10 MED ORDER — DEXTROSE 5 % IV SOLN
500.0000 mg | INTRAVENOUS | Status: DC
  Administered 2011-01-10 – 2011-01-12 (×3): 500 mg via INTRAVENOUS
  Filled 2011-01-10 (×4): qty 500

## 2011-01-10 MED ORDER — OSELTAMIVIR PHOSPHATE 75 MG PO CAPS
75.0000 mg | ORAL_CAPSULE | Freq: Two times a day (BID) | ORAL | Status: DC
  Administered 2011-01-10 – 2011-01-12 (×6): 75 mg via ORAL
  Filled 2011-01-10 (×6): qty 1

## 2011-01-10 NOTE — H&P (Signed)
Brandi Rivas is an 50 y.o. female.   Chief Complaint: Shortness of breath. HPI: 50 year old female with no significant past medical history presents with complaints of shortness of breath with cough and phlegm and chest pain when she coughs since yesterday morning. She also had subjective feeling of fever. As the symptoms did not get better she came to the ER. In the ER chest x-ray reveals bilateral pneumonia and she had a fever of 10 39F. Patient has chest pain only when she coughs. Denies any nausea vomiting abdominal pain. Has mild headache. Denies anybody else in the family or at work place having similar symptoms.  History reviewed. No pertinent past medical history.  Past Surgical History  Procedure Date  . Arthroscopy knee w/ drilling     History reviewed. No pertinent family history. Social History:  reports that she has never smoked. She does not have any smokeless tobacco history on file. She reports that she does not drink alcohol. Her drug history not on file.  Allergies: No Known Allergies  Medications Prior to Admission  Medication Dose Route Frequency Provider Last Rate Last Dose  . azithromycin (ZITHROMAX) 500 mg in dextrose 5 % 250 mL IVPB  500 mg Intravenous Once Peter A. Patrica Duel, MD   500 mg at 01/09/11 1819  . cefTRIAXone (ROCEPHIN) 1 g in dextrose 5 % 50 mL IVPB  1 g Intravenous Once Peter A. Patrica Duel, MD   1 g at 01/09/11 1816  . ketorolac (TORADOL) 30 MG/ML injection 30 mg  30 mg Intravenous Once Thomasene Lot, PA   30 mg at 01/09/11 2035  . oxyCODONE-acetaminophen (PERCOCET) 5-325 MG per tablet 1 tablet  1 tablet Oral Once Peter A. Patrica Duel, MD   1 tablet at 01/09/11 1754  . sodium chloride 0.9 % bolus 1,000 mL  1,000 mL Intravenous Once Peter A. Patrica Duel, MD   1,000 mL at 01/09/11 1816   No current outpatient prescriptions on file as of 01/09/2011.    Results for orders placed during the hospital encounter of 01/09/11 (from the past 48 hour(s))  URINALYSIS, ROUTINE  W REFLEX MICROSCOPIC     Status: Abnormal   Collection Time   01/09/11  4:45 PM      Component Value Range Comment   Color, Urine YELLOW  YELLOW     APPearance CLEAR  CLEAR     Specific Gravity, Urine 1.022  1.005 - 1.030     pH 6.5  5.0 - 8.0     Glucose, UA NEGATIVE  NEGATIVE (mg/dL)    Hgb urine dipstick NEGATIVE  NEGATIVE     Bilirubin Urine NEGATIVE  NEGATIVE     Ketones, ur NEGATIVE  NEGATIVE (mg/dL)    Protein, ur NEGATIVE  NEGATIVE (mg/dL)    Urobilinogen, UA 1.0  0.0 - 1.0 (mg/dL)    Nitrite POSITIVE (*) NEGATIVE     Leukocytes, UA SMALL (*) NEGATIVE    URINE MICROSCOPIC-ADD ON     Status: Normal   Collection Time   01/09/11  4:45 PM      Component Value Range Comment   Squamous Epithelial / LPF RARE  RARE     WBC, UA 0-2  <3 (WBC/hpf)    Bacteria, UA RARE  RARE    CBC     Status: Abnormal   Collection Time   01/09/11  6:04 PM      Component Value Range Comment   WBC 4.5  4.0 - 10.5 (K/uL)    RBC 3.94  3.87 - 5.11 (MIL/uL)    Hemoglobin 11.0 (*) 12.0 - 15.0 (g/dL)    HCT 14.7 (*) 82.9 - 46.0 (%)    MCV 89.1  78.0 - 100.0 (fL)    MCH 27.9  26.0 - 34.0 (pg)    MCHC 31.3  30.0 - 36.0 (g/dL)    RDW 56.2  13.0 - 86.5 (%)    Platelets 172  150 - 400 (K/uL)   BASIC METABOLIC PANEL     Status: Abnormal   Collection Time   01/09/11  6:04 PM      Component Value Range Comment   Sodium 132 (*) 135 - 145 (mEq/L)    Potassium 3.4 (*) 3.5 - 5.1 (mEq/L)    Chloride 101  96 - 112 (mEq/L)    CO2 23  19 - 32 (mEq/L)    Glucose, Bld 94  70 - 99 (mg/dL)    BUN 11  6 - 23 (mg/dL)    Creatinine, Ser 7.84  0.50 - 1.10 (mg/dL)    Calcium 8.6  8.4 - 10.5 (mg/dL)    GFR calc non Af Amer >90  >90 (mL/min)    GFR calc Af Amer >90  >90 (mL/min)    Dg Chest 2 View  01/09/2011  *RADIOLOGY REPORT*  Clinical Data: Fever, chest pain, cough  CHEST - 2 VIEW  Comparison: 03/02/2010  Findings: Mild bibasilar airspace disease.  Upper lobe airspace disease, best seen on the lateral view was not  present previously and may represent pneumonia.  Negative for heart failure or effusion.  IMPRESSION: Bilateral airspace disease in the bases and upper lobe.  Possible pneumonia.  Original Report Authenticated By: Camelia Phenes, M.D.    Review of Systems  Constitutional: Positive for fever.  HENT: Negative.   Eyes: Negative.   Respiratory: Positive for cough, sputum production and shortness of breath.   Cardiovascular: Positive for chest pain.  Gastrointestinal: Negative.   Genitourinary: Negative.   Musculoskeletal: Negative.   Skin: Negative.   Neurological: Negative.   Endo/Heme/Allergies: Negative.   Psychiatric/Behavioral: Negative.     Blood pressure 113/71, pulse 90, temperature 98 F (36.7 C), temperature source Oral, resp. rate 20, height 5\' 9"  (1.753 m), weight 102.059 kg (225 lb), last menstrual period 12/21/2010, SpO2 96.00%. Physical Exam  Constitutional: She is oriented to person, place, and time. She appears well-developed and well-nourished. No distress.  HENT:  Head: Normocephalic and atraumatic.  Right Ear: External ear normal.  Left Ear: External ear normal.  Nose: Nose normal.  Mouth/Throat: Oropharynx is clear and moist. No oropharyngeal exudate.  Eyes: Conjunctivae and EOM are normal. Pupils are equal, round, and reactive to light. Right eye exhibits no discharge. Left eye exhibits no discharge. No scleral icterus.  Neck: Normal range of motion. Neck supple. No JVD present. No thyromegaly present.  Cardiovascular: Normal rate, regular rhythm, normal heart sounds and intact distal pulses.   Respiratory: Effort normal and breath sounds normal. No stridor. No respiratory distress. She has no wheezes. She has no rales.  GI: Soft. Bowel sounds are normal. She exhibits no distension. There is no tenderness. There is no rebound.  Musculoskeletal: Normal range of motion. She exhibits no edema and no tenderness.  Neurological: She is alert and oriented to person,  place, and time. She has normal reflexes. No cranial nerve deficit. Coordination normal.  Skin: Skin is warm and dry. No rash noted. She is not diaphoretic. No erythema.  Psychiatric: Her behavior is normal.  Assessment/Plan #1. Pneumonia. #2. Possible UTI. #3. Pleuritic-type chest pain from #1 reason.  Plan Admit to telemetry as patient is able to speak complete sentences and saturating normal on room air. We will treat her pneumonia is community-acquired. It could be also viral. For which I am going to get flu PCR. Until then patient will be on droplet precautions. Possibility of a urinary tract infection, we'll get urine cultures. Presently patient will be on ceftriaxone, Zithromax and Tamiflu.  Heavenly Christine N. 01/10/2011, 12:00 AM

## 2011-01-10 NOTE — Progress Notes (Signed)
Utilization Review Completed.Caylin Nass T12/06/2010   

## 2011-01-10 NOTE — Progress Notes (Signed)
Subjective:  Patient feeling better, breathing better. Still febrile.   Objective: Filed Vitals:   01/10/11 0017 01/10/11 0350 01/10/11 0700 01/10/11 1603  BP: 107/67 135/75 114/76 138/86  Pulse: 88 91 75 82  Temp: 99.7 F (37.6 C) 102.5 F (39.2 C) 98.6 F (37 C) 102.5 F (39.2 C)  TempSrc: Oral Oral Oral Oral  Resp: 16 20 20 20   Height:      Weight:      SpO2: 97% 96% 95% 96%   Weight change:   Intake/Output Summary (Last 24 hours) at 01/10/11 1635 Last data filed at 01/10/11 1226  Gross per 24 hour  Intake    720 ml  Output      0 ml  Net    720 ml    General: Alert, awake, oriented x3, in no acute distress.  HEENT: No bruits, no goiter.  Heart: Regular rate and rhythm, without murmurs, rubs, gallops.  Lungs: Crackles bilaterally, bilateral air movement.  Abdomen: Soft, nontender, nondistended, positive bowel sounds.  Neuro: Grossly intact, nonfocal.   Lab Results:  [facility] 01/10/11 0510 01/09/11 1804  NA 132* 132*  K 3.2* 3.4*  CL 103 101  CO2 21 23  GLUCOSE 94 94  BUN 9 11  CREATININE 0.64 0.66  CALCIUM 8.2* 8.6  MG -- --  PHOS -- --    Basename 01/10/11 0510  AST 14  ALT 10  ALKPHOS 40  BILITOT 1.4*  PROT 6.4  ALBUMIN 3.1*    Basename 01/10/11 0510 01/09/11 1804  WBC 2.9* 4.5  NEUTROABS -- --  HGB 10.4* 11.0*  HCT 32.2* 35.1*  MCV 88.0 89.1  PLT 160 172    Studies/Results: Dg Chest 2 View  01/09/2011  *RADIOLOGY REPORT*  Clinical Data: Fever, chest pain, cough  CHEST - 2 VIEW  Comparison: 03/02/2010  Findings: Mild bibasilar airspace disease.  Upper lobe airspace disease, best seen on the lateral view was not present previously and may represent pneumonia.  Negative for heart failure or effusion.  IMPRESSION: Bilateral airspace disease in the bases and upper lobe.  Possible pneumonia.  Original Report Authenticated By: Camelia Phenes, M.D.    Medications: I have reviewed the patient's current medications.   Patient Active Hospital  Problem List: Pneumonia (01/09/2011) Influenza a positive. Continue with Tamiflu and antibiotics in case superimpose bactrerial infection.   Leukopenia: Check HIV.  Hypokalemia: replete with 40 meq po times one.  Hyponatremia: continue with IV fluids.   Anemia: check anemia panel.    LOS: 1 day   REGALADO,BELKYS M.D.  [facility] 01/10/2011, 4:35 PM

## 2011-01-11 LAB — CBC
HCT: 34.3 % — ABNORMAL LOW (ref 36.0–46.0)
Hemoglobin: 11.1 g/dL — ABNORMAL LOW (ref 12.0–15.0)
MCH: 28.6 pg (ref 26.0–34.0)
MCHC: 32.4 g/dL (ref 30.0–36.0)
MCV: 88.4 fL (ref 78.0–100.0)
Platelets: 167 10*3/uL (ref 150–400)
RBC: 3.88 MIL/uL (ref 3.87–5.11)
RDW: 12.8 % (ref 11.5–15.5)
WBC: 5.6 10*3/uL (ref 4.0–10.5)

## 2011-01-11 LAB — HIV ANTIBODY (ROUTINE TESTING W REFLEX): HIV: NONREACTIVE

## 2011-01-11 LAB — URINE CULTURE
Colony Count: NO GROWTH
Culture  Setup Time: 201212051510
Culture: NO GROWTH

## 2011-01-11 LAB — BASIC METABOLIC PANEL
BUN: 7 mg/dL (ref 6–23)
CO2: 21 mEq/L (ref 19–32)
Calcium: 8.3 mg/dL — ABNORMAL LOW (ref 8.4–10.5)
Chloride: 102 mEq/L (ref 96–112)
Creatinine, Ser: 0.67 mg/dL (ref 0.50–1.10)
GFR calc Af Amer: >90 mL/min (ref >90)
GFR calc non Af Amer: >90 mL/min (ref >90)
Glucose, Bld: 128 mg/dL — ABNORMAL HIGH (ref 70–99)
Potassium: 3.6 mEq/L (ref 3.5–5.1)
Sodium: 134 mEq/L — ABNORMAL LOW (ref 135–145)

## 2011-01-11 MED ORDER — POTASSIUM CHLORIDE CRYS ER 20 MEQ PO TBCR
20.0000 meq | EXTENDED_RELEASE_TABLET | Freq: Once | ORAL | Status: AC
  Administered 2011-01-11: 20 meq via ORAL
  Filled 2011-01-11: qty 1

## 2011-01-11 MED ORDER — FERROUS SULFATE 325 (65 FE) MG PO TABS
325.0000 mg | ORAL_TABLET | Freq: Two times a day (BID) | ORAL | Status: DC
  Administered 2011-01-11 – 2011-01-12 (×3): 325 mg via ORAL
  Filled 2011-01-11 (×5): qty 1

## 2011-01-11 NOTE — Progress Notes (Signed)
Subjective: Feeling better. SOB improved. Still coughing.  Objective: Filed Vitals:   01/11/11 0650 01/11/11 1000 01/11/11 1046 01/11/11 1433  BP: 105/71 113/70 112/71 117/73  Pulse: 86 84 90 86  Temp: 100.6 F (38.1 C) 99.5 F (37.5 C) 97.7 F (36.5 C) 98.7 F (37.1 C)  TempSrc: Oral     Resp: 20 18 18 20   Height:      Weight:      SpO2: 96% 98% 93% 97%   Weight change:   Intake/Output Summary (Last 24 hours) at 01/11/11 1528 Last data filed at 01/11/11 1231  Gross per 24 hour  Intake    240 ml  Output    300 ml  Net    -60 ml    General: Alert, awake, oriented x3, in no acute distress.  HEENT: No bruits, no goiter.  Heart: Regular rate and rhythm, without murmurs, rubs, gallops.  Lungs: Crackles bilaterlly, bilateral air movement.  Abdomen: Soft, nontender, nondistended, positive bowel sounds.  Neuro: Grossly intact, nonfocal. Extremities: no edema.    Lab Results:  [facility] 01/11/11 0519 01/10/11 0510  NA 134* 132*  K 3.6 3.2*  CL 102 103  CO2 21 21  GLUCOSE 128* 94  BUN 7 9  CREATININE 0.67 0.64  CALCIUM 8.3* 8.2*  MG -- --  PHOS -- --    Basename 01/10/11 0510  AST 14  ALT 10  ALKPHOS 40  BILITOT 1.4*  PROT 6.4  ALBUMIN 3.1*    Basename 01/11/11 0519 01/10/11 0510  WBC 5.6 2.9*  NEUTROABS -- --  HGB 11.1* 10.4*  HCT 34.3* 32.2*  MCV 88.4 88.0  PLT 167 160    Basename 01/10/11 1650  VITAMINB12 534  FOLATE 13.6  FERRITIN 63  TIBC 314  IRON 20*  RETICCTPCT 1.0    Micro Results: Recent Results (from the past 240 hour(s))  URINE CULTURE     Status: Normal   Collection Time   01/10/11  7:08 AM      Component Value Range Status Comment   Specimen Description URINE, RANDOM   Final    Special Requests NONE   Final    Setup Time [id number]   Final    Colony Count NO GROWTH   Final    Culture NO GROWTH   Final    Report Status 01/11/2011 FINAL   Final     Studies/Results: Dg Chest 2 View  01/09/2011  *RADIOLOGY REPORT*   Clinical Data: Fever, chest pain, cough  CHEST - 2 VIEW  Comparison: 03/02/2010  Findings: Mild bibasilar airspace disease.  Upper lobe airspace disease, best seen on the lateral view was not present previously and may represent pneumonia.  Negative for heart failure or effusion.  IMPRESSION: Bilateral airspace disease in the bases and upper lobe.  Possible pneumonia.  Original Report Authenticated By: Camelia Phenes, M.D.    Medications: I have reviewed the patient's current medications.  Patient Active Hospital Problem List: Pneumonia (01/09/2011)  Influenza a positive. Continue with Tamiflu and antibiotics in case superimpose bactrerial infection.  Tamiflu day 2. Ceftriaxone and Azithro day 2.  Still febrile.  Leukopenia: Resolved  HIV negative.  Hypokalemia: Resolved. Will give 20 meq today. Hyponatremia: continue with IV fluids. Improved.  Anemia: iron deficiency. Iron at 20, sat at 6, ferritin 13.   I will start ferrous sulfate. Patient will need screening colonoscopy. Patient aware of results.  Unlikely UTI, urine no growth.     LOS: 2 days   Saraih Lorton  M.D.  [facility] 01/11/2011, 3:28 PM

## 2011-01-12 LAB — BASIC METABOLIC PANEL
BUN: 7 mg/dL (ref 6–23)
CO2: 24 mEq/L (ref 19–32)
Calcium: 8.3 mg/dL — ABNORMAL LOW (ref 8.4–10.5)
Chloride: 104 mEq/L (ref 96–112)
Creatinine, Ser: 0.65 mg/dL (ref 0.50–1.10)
GFR calc Af Amer: >90 mL/min (ref >90)
GFR calc non Af Amer: >90 mL/min (ref >90)
Glucose, Bld: 95 mg/dL (ref 70–99)
Potassium: 3.7 mEq/L (ref 3.5–5.1)
Sodium: 137 mEq/L (ref 135–145)

## 2011-01-12 MED ORDER — FERROUS SULFATE 325 (65 FE) MG PO TABS: 325.0000 mg | ORAL_TABLET | Freq: Two times a day (BID) | ORAL | Status: DC

## 2011-01-12 MED ORDER — MOXIFLOXACIN HCL 400 MG PO TABS: 400.0000 mg | ORAL_TABLET | Freq: Every day | ORAL | Status: AC

## 2011-01-12 MED ORDER — OSELTAMIVIR PHOSPHATE 75 MG PO CAPS: 75.0000 mg | ORAL_CAPSULE | Freq: Two times a day (BID) | ORAL | Status: AC

## 2011-01-12 MED ORDER — ACETAMINOPHEN 325 MG PO TABS: 650.0000 mg | ORAL_TABLET | Freq: Four times a day (QID) | ORAL | Status: AC | PRN

## 2011-01-12 NOTE — Progress Notes (Signed)
Patient discharged home with prescriptions, discharge instructions and work note.  Patient has no questions at this time.  Patient left unit in wheelchair in stable condition.  Osvaldo Angst, RN

## 2011-01-12 NOTE — Discharge Summary (Signed)
Admit date: 01/09/2011 Discharge date: 01/12/2011  Primary Care Physician:  No primary provider on file.   Discharge Diagnoses:   Influenza A Pneumonia. Hyponatremia, secondary to dehydration Hypokalemia, Resolved. Iron deficiency anemia. Leukopenia: Resolved    DISCHARGE MEDICATION: Current Discharge Medication List    START taking these medications   Details  acetaminophen (TYLENOL) 325 MG tablet Take 2 tablets (650 mg total) by mouth every 6 (six) hours as needed (or Fever >/= 101). Qty: 30 tablet, Refills: 0    ferrous sulfate 325 (65 FE) MG tablet Take 1 tablet (325 mg total) by mouth 2 (two) times daily with a meal. Qty: 30 tablet, Refills: 0    moxifloxacin (AVELOX) 400 MG tablet Take 1 tablet (400 mg total) by mouth daily. Qty: 4 tablet, Refills: 0    oseltamivir (TAMIFLU) 75 MG capsule Take 1 capsule (75 mg total) by mouth 2 (two) times daily. Qty: 4 capsule, Refills: 0           Consults:  None   SIGNIFICANT DIAGNOSTIC STUDIES:  Dg Chest 2 View  01/09/2011  *RADIOLOGY REPORT*  Clinical Data: Fever, chest pain, cough  CHEST - 2 VIEW  Comparison: 03/02/2010  Findings: Mild bibasilar airspace disease.  Upper lobe airspace disease, best seen on the lateral view was not present previously and may represent pneumonia.  Negative for heart failure or effusion.  IMPRESSION: Bilateral airspace disease in the bases and upper lobe.  Possible pneumonia.  Original Report Authenticated By: Camelia Phenes, M.D.      Recent Results (from the past 240 hour(s))  URINE CULTURE     Status: Normal   Collection Time   01/10/11  7:08 AM      Component Value Range Status Comment   Specimen Description URINE, RANDOM   Final    Special Requests NONE   Final    Setup Time [id number]   Final    Colony Count NO GROWTH   Final    Culture NO GROWTH   Final    Report Status 01/11/2011 FINAL   Final     BRIEF ADMITTING H & P: 50 year old female with no significant past medical  history presents with complaints of shortness of breath with cough and phlegm and chest pain when she coughs since yesterday morning. She also had subjective feeling of fever. As the symptoms did not get better she came to the ER. In the ER chest x-ray reveals bilateral pneumonia and she had a fever of 10 4F. Patient has chest pain only when she coughs. Denies any nausea vomiting abdominal pain. Has mild headache. Denies anybody else in the family or at work place having similar symptoms.   Hospital Course:  Pneumonia : Patient was admitted to regular bed, Influenza A. was positive. She was treated with Tamiflu and received 3 days of treatment during the hospital. She will need to more days of treatment. She received 3 days of ceftriaxone and Azithromycin to cover for superimpose PNA. Patient has remained afebrile for 24 hours. She is feeling better denies shortness of breath.   Iron deficiency anemia: She will need to be referred  for screening colonoscopy. She was started on ferrous sulfate. Iron at 20, sat at 6, ferritin 13.  Hyponatremia, secondary to dehydration. Resolved with IV fluids. Leukopenia: Resolved HIV negative.  Unlikely UTI, urine no growth.   Disposition and Follow-up: Follow up with PCP within 1 week.  Discharge Orders    Future Orders Please Complete By Expires  Diet general      Increase activity slowly           DISCHARGE EXAM: The physical exam is generally normal. Patient appears well, alert and oriented x 3, pleasant, cooperative. Vitals are as noted. Neck supple and free of adenopathy, or masses. No thyromegaly. PERLA. Ears, throat are normal. Lungs are clear to auscultation. Heart sounds are normal, no murmurs, clicks, gallops or rubs. Abdomen is soft, no tenderness, masses or organomegaly.  Extremities are normal. Peripheral pulses are normal. Screening neurological exam is normal without focal findings. Skin is normal without suspicious lesions noted.   Blood  pressure 101/67, pulse 74, temperature 98.1 F (36.7 C), temperature source Oral, resp. rate 18, height 5\' 9"  (1.753 m), weight 102.059 kg (225 lb), last menstrual period 12/21/2010, SpO2 100.00%.   Basename 01/12/11 0625 01/11/11 0519  NA 137 134*  K 3.7 3.6  CL 104 102  CO2 24 21  GLUCOSE 95 128*  BUN 7 7  CREATININE 0.65 0.67  CALCIUM 8.3* 8.3*  MG -- --  PHOS -- --    Basename 01/10/11 0510  AST 14  ALT 10  ALKPHOS 40  BILITOT 1.4*  PROT 6.4  ALBUMIN 3.1*    Basename 01/11/11 0519 01/10/11 0510  WBC 5.6 2.9*  NEUTROABS -- --  HGB 11.1* 10.4*  HCT 34.3* 32.2*  MCV 88.4 88.0  PLT 167 160    Signed: Journi Moffa M.D. 01/12/2011, 1:22 PM

## 2011-01-15 NOTE — Progress Notes (Signed)
   CARE MANAGEMENT NOTE 01/15/2011  Patient:  Brandi Rivas, Brandi Rivas   Account Number:  [id number]  Date Initiated:  01/10/2011  Documentation initiated by:  MAYO,HENRIETTA  Subjective/Objective Assessment:   50 yr-old female adm with (B) PNA; lives with 2 sons, independent PTA.     Action/Plan:   Anticipated DC Date:  01/12/2011   Anticipated DC Plan:  HOME/SELF CARE      DC Planning Services  CM consult      Choice offered to / List presented to:             Status of service:   Medicare Important Message given?   (If response is "NO", the following Medicare IM given date fields will be blank) Date Medicare IM given:   Date Additional Medicare IM given:    Discharge Disposition:  HOME/SELF CARE  Per UR Regulation:  Reviewed for med. necessity/level of care/duration of stay  Comments:  PCP:  Dr. Lupe Carney

## 2011-02-06 ENCOUNTER — Emergency Department (HOSPITAL_COMMUNITY)
Admission: EM | Admit: 2011-02-06 | Discharge: 2011-02-06 | Disposition: A | Discharge status: Home / Self Care | Payer: BC Managed Care – PPO | Source: Home / Self Care

## 2011-02-06 ENCOUNTER — Encounter (HOSPITAL_COMMUNITY): Payer: Self-pay | Admitting: *Deleted

## 2011-02-06 DIAGNOSIS — L272 Dermatitis due to ingested food: Secondary | ICD-10-CM

## 2011-02-06 MED ORDER — METHYLPREDNISOLONE SODIUM SUCC 125 MG IJ SOLR
INTRAMUSCULAR | Status: AC
  Filled 2011-02-06: qty 2

## 2011-02-06 MED ORDER — METHYLPREDNISOLONE SODIUM SUCC 125 MG IJ SOLR: 125.0000 mg | Freq: Once | INTRAMUSCULAR | Status: DC

## 2011-02-06 MED ORDER — EPINEPHRINE 0.3 MG/0.3ML IJ DEVI: 0.3000 mg | Freq: Once | INTRAMUSCULAR | Status: DC

## 2011-02-06 MED ORDER — DIPHENHYDRAMINE HCL 50 MG/ML IJ SOLN: 50.0000 mg | Freq: Once | INTRAMUSCULAR | Status: DC

## 2011-02-06 MED ORDER — METHYLPREDNISOLONE SODIUM SUCC 125 MG IJ SOLR
125.0000 mg | Freq: Once | INTRAMUSCULAR | Status: AC
  Administered 2011-02-06: 125 mg via INTRAMUSCULAR

## 2011-02-06 MED ORDER — DIPHENHYDRAMINE HCL 50 MG/ML IJ SOLN
50.0000 mg | Freq: Once | INTRAMUSCULAR | Status: AC
  Administered 2011-02-06: 50 mg via INTRAMUSCULAR

## 2011-02-06 MED ORDER — CETIRIZINE HCL 10 MG PO TABS: 10.0000 mg | ORAL_TABLET | Freq: Every day | ORAL | Status: DC

## 2011-02-06 MED ORDER — DIPHENHYDRAMINE HCL 25 MG PO CAPS
ORAL_CAPSULE | ORAL | Status: AC
  Filled 2011-02-06: qty 2

## 2011-02-06 MED ORDER — PREDNISONE 20 MG PO TABS: 20.0000 mg | ORAL_TABLET | Freq: Two times a day (BID) | ORAL | Status: AC

## 2011-02-06 NOTE — ED Provider Notes (Signed)
History     CSN: [id number]  Arrival date & time 02/06/11  1435   None     Chief Complaint  Patient presents with  . Rash    (Consider location/radiation/quality/duration/timing/severity/associated sxs/prior treatment) HPI Comments: Pt states Sunday she went out to eat at [facility]. She ate shrimp and flounder which she has had there before. Later that evening after she arrived home she began to itch and broke out in a rash. She also has swelling of her feet and hands. Benadryl provides some temporary relief of symptoms but then comes back when it wears off. No lip swelling, tongue swelling, difficulty swallowing or dyspnea. She denies abd pain, vomiting or diarrhea but states Sunday evening she had nausea.    History reviewed. No pertinent past medical history.  Past Surgical History  Procedure Date  . Arthroscopy knee w/ drilling     History reviewed. No pertinent family history.  History  Substance Use Topics  . Smoking status: Never Smoker   . Smokeless tobacco: Not on file  . Alcohol Use: No    OB History    Grav Para Term Preterm Abortions TAB SAB Ect Mult Living                  Review of Systems  Constitutional: Negative for fever and chills.  HENT: Negative for ear pain, congestion, sore throat, facial swelling, rhinorrhea, sneezing, mouth sores, trouble swallowing and voice change.   Respiratory: Negative for cough, shortness of breath and wheezing.   Cardiovascular: Negative for chest pain.  Gastrointestinal: Negative for vomiting, abdominal pain and diarrhea.  Skin: Positive for rash.    Allergies  Review of patient's allergies indicates no known allergies.  Home Medications   Current Outpatient Rx  Name Route Sig Dispense Refill  . CETIRIZINE HCL 10 MG PO TABS Oral Take 1 tablet (10 mg total) by mouth daily. 7 tablet 0  . EPINEPHRINE 0.3 MG/0.3ML IJ DEVI Intramuscular Inject 0.3 mLs (0.3 mg total) into the muscle once. 1 Device 0  . FERROUS  SULFATE 325 (65 FE) MG PO TABS Oral Take 1 tablet (325 mg total) by mouth 2 (two) times daily with a meal. 30 tablet 0  . PREDNISONE 20 MG PO TABS Oral Take 1 tablet (20 mg total) by mouth 2 (two) times daily. 10 tablet 0    BP 123/79  Pulse 101  Temp(Src) 99 F (37.2 C) (Oral)  Resp 20  SpO2 100%  LMP 12/21/2010  Physical Exam  Nursing note and vitals reviewed. Constitutional: She appears well-developed and well-nourished. No distress.  HENT:  Head: Normocephalic and atraumatic.  Right Ear: Tympanic membrane, external ear and ear canal normal.  Left Ear: Tympanic membrane, external ear and ear canal normal.  Nose: Nose normal.  Mouth/Throat: Uvula is midline, oropharynx is clear and moist and mucous membranes are normal. No oral lesions. No uvula swelling. No oropharyngeal exudate, posterior oropharyngeal edema or posterior oropharyngeal erythema.  Neck: Neck supple.  Cardiovascular: Normal rate, regular rhythm and normal heart sounds.   Pulmonary/Chest: Effort normal and breath sounds normal. No respiratory distress.  Lymphadenopathy:    She has no cervical adenopathy.  Neurological: She is alert.  Skin: Skin is warm and dry. Rash noted. Rash is urticarial.  Psychiatric: She has a normal mood and affect.    ED Course  Procedures (including critical care time)  Labs Reviewed - No data to display No results found.   1. Dermatitis due to allergic reaction  to food       MDM  Urticaria after eating fish and shrimp. No previous hx of allergy. Pt had improvement with SoluMedrol & Bendadryl injections. Discussed avoidance of both with pt due to uncertainity of which caused allergic rxn and future rxn could be more severe, even life threatening. EpiPen rx for future symptoms. F/U with PCP.         Melody Comas, [place] 02/11/11 1125

## 2011-02-06 NOTE — ED Notes (Signed)
Pt    Reports      She  Ate  Some  Scrimps    And  Flounder  At  [employer]      Sunday  She  Then developed   A  Rash        With  Redness  And  Itching

## 2011-02-12 NOTE — ED Provider Notes (Signed)
Medical screening examination/treatment/procedure(s) were performed by non-physician practitioner and as supervising physician I was immediately available for consultation/collaboration.  Jameisha Stofko   Cozy Veale, MD 02/12/11 1713 

## 2011-06-27 ENCOUNTER — Other Ambulatory Visit: Payer: Self-pay | Admitting: Family Medicine

## 2011-06-27 DIAGNOSIS — Z1231 Encounter for screening mammogram for malignant neoplasm of breast: Secondary | ICD-10-CM

## 2011-08-06 ENCOUNTER — Ambulatory Visit
Admission: RE | Admit: 2011-08-06 | Discharge: 2011-08-06 | Disposition: A | Discharge status: Home / Self Care | Payer: BC Managed Care – PPO | Source: Ambulatory Visit | Attending: Family Medicine | Admitting: Family Medicine

## 2011-08-06 DIAGNOSIS — Z1231 Encounter for screening mammogram for malignant neoplasm of breast: Secondary | ICD-10-CM

## 2011-10-01 ENCOUNTER — Other Ambulatory Visit: Payer: Self-pay | Admitting: Family Medicine

## 2011-10-01 ENCOUNTER — Other Ambulatory Visit (HOSPITAL_COMMUNITY)
Admission: RE | Admit: 2011-10-01 | Discharge: 2011-10-01 | Disposition: A | Discharge status: Home / Self Care | Payer: BC Managed Care – PPO | Source: Ambulatory Visit | Attending: Family Medicine | Admitting: Family Medicine

## 2011-10-01 DIAGNOSIS — Z124 Encounter for screening for malignant neoplasm of cervix: Secondary | ICD-10-CM | POA: Insufficient documentation

## 2011-10-19 ENCOUNTER — Encounter (HOSPITAL_COMMUNITY): Payer: Self-pay | Admitting: Emergency Medicine

## 2011-10-19 ENCOUNTER — Emergency Department (HOSPITAL_COMMUNITY)
Admission: EM | Admit: 2011-10-19 | Discharge: 2011-10-19 | Disposition: A | Discharge status: Nursing Home (Includes ICF) | Payer: BC Managed Care – PPO | Source: Home / Self Care

## 2011-10-19 ENCOUNTER — Observation Stay (HOSPITAL_COMMUNITY)
Admission: EM | Admit: 2011-10-19 | Discharge: 2011-10-20 | Disposition: A | Discharge status: Home / Self Care | Payer: BC Managed Care – PPO | Attending: Emergency Medicine | Admitting: Emergency Medicine

## 2011-10-19 ENCOUNTER — Encounter (HOSPITAL_COMMUNITY): Payer: Self-pay | Admitting: *Deleted

## 2011-10-19 DIAGNOSIS — M25561 Pain in right knee: Secondary | ICD-10-CM

## 2011-10-19 DIAGNOSIS — R609 Edema, unspecified: Secondary | ICD-10-CM

## 2011-10-19 DIAGNOSIS — R600 Localized edema: Secondary | ICD-10-CM

## 2011-10-19 DIAGNOSIS — M7989 Other specified soft tissue disorders: Secondary | ICD-10-CM | POA: Insufficient documentation

## 2011-10-19 DIAGNOSIS — M1711 Unilateral primary osteoarthritis, right knee: Secondary | ICD-10-CM

## 2011-10-19 DIAGNOSIS — M1710 Unilateral primary osteoarthritis, unspecified knee: Principal | ICD-10-CM | POA: Insufficient documentation

## 2011-10-19 DIAGNOSIS — M25569 Pain in unspecified knee: Secondary | ICD-10-CM

## 2011-10-19 HISTORY — DX: Essential (primary) hypertension: I10

## 2011-10-19 NOTE — ED Provider Notes (Signed)
Medical screening examination/treatment/procedure(s) were performed by non-physician practitioner and as supervising physician I was immediately available for consultation/collaboration.  Leslee Home, M.D.   Reuben Likes, MD 10/19/11 [phone]

## 2011-10-19 NOTE — ED Notes (Signed)
Pt c/o right knee pain and right lower leg swelling x 3 days.  Was sent from Urgent Care for further work-up.  Worried about fluid build up and blood clot.

## 2011-10-19 NOTE — ED Notes (Signed)
Has had problems with right knee for years. Has had arthroscopy x 2 (last 4 yrs ago). States right knee has been more painful past 3 days and swollen. Pt has worked at [employer] for 25 yrs, on her feet all day.

## 2011-10-19 NOTE — ED Provider Notes (Signed)
History     CSN: [id number]  Arrival date & time 10/19/11  1828   None     Chief Complaint  Patient presents with  . Knee Pain    (Consider location/radiation/quality/duration/timing/severity/associated sxs/prior treatment) HPI Comments: Presents with 3 d hx of R lower leg pain, edema. Also R lateral knee pain. No hx of trauma. She has had to use a cain for walking primarily for the lower leg pain.    Patient is a 51 y.o. female presenting with knee pain.  Knee Pain    History reviewed. No pertinent past medical history.  Past Surgical History  Procedure Date  . Arthroscopy knee w/ drilling     No family history on file.  History  Substance Use Topics  . Smoking status: Never Smoker   . Smokeless tobacco: Not on file  . Alcohol Use: No    OB History    Grav Para Term Preterm Abortions TAB SAB Ect Mult Living                  Review of Systems  Constitutional: Negative.   Respiratory: Negative.   Cardiovascular: Negative.   Gastrointestinal: Negative.   Musculoskeletal: Positive for joint swelling and arthralgias.  Skin: Negative for color change.  Neurological: Negative.     Allergies  Review of patient's allergies indicates no known allergies.  Home Medications   Current Outpatient Rx  Name Route Sig Dispense Refill  . CETIRIZINE HCL 10 MG PO TABS Oral Take 1 tablet (10 mg total) by mouth daily. 7 tablet 0  . EPINEPHRINE 0.3 MG/0.3ML IJ DEVI Intramuscular Inject 0.3 mLs (0.3 mg total) into the muscle once. 1 Device 0  . FERROUS SULFATE 325 (65 FE) MG PO TABS Oral Take 1 tablet (325 mg total) by mouth 2 (two) times daily with a meal. 30 tablet 0    BP 131/95  Pulse 70  Temp 97.8 F (36.6 C) (Oral)  Resp 20  SpO2 100%  LMP 10/12/2011  Physical Exam  Constitutional: She is oriented to person, place, and time. She appears well-developed and well-nourished. No distress.  Neck: Normal range of motion. Neck supple.  Musculoskeletal:       1-2+  pitting edema to the R lower extremity below the knee. The leg is tender from the knee to ankle. +Calf tenderness and worse with ambulation and weight bearing.  Also with tenderness medial aspect of the knee over the bony prominences.   Neurological: She is alert and oriented to person, place, and time. No cranial nerve deficit.  Skin: Skin is warm and dry. No rash noted. No erythema.  Psychiatric: She has a normal mood and affect.    ED Course  Procedures (including critical care time)  Labs Reviewed - No data to display No results found.   1. Leg edema, right   2. Knee pain, right       MDM  To ED to R/O DVT        Hayden Rasmussen, NP 10/19/11 2025

## 2011-10-20 ENCOUNTER — Emergency Department (HOSPITAL_COMMUNITY): Payer: BC Managed Care – PPO

## 2011-10-20 DIAGNOSIS — M7989 Other specified soft tissue disorders: Secondary | ICD-10-CM

## 2011-10-20 DIAGNOSIS — M79609 Pain in unspecified limb: Secondary | ICD-10-CM

## 2011-10-20 LAB — BASIC METABOLIC PANEL WITH GFR
BUN: 15 mg/dL (ref 6–23)
CO2: 27 meq/L (ref 19–32)
Calcium: 8.6 mg/dL (ref 8.4–10.5)
Chloride: 103 meq/L (ref 96–112)
Creatinine, Ser: 0.72 mg/dL (ref 0.50–1.10)
GFR calc Af Amer: >90 mL/min
GFR calc non Af Amer: >90 mL/min
Glucose, Bld: 106 mg/dL — ABNORMAL HIGH (ref 70–99)
Potassium: 3.5 meq/L (ref 3.5–5.1)
Sodium: 140 meq/L (ref 135–145)

## 2011-10-20 LAB — CBC
HCT: 36.7 % (ref 36.0–46.0)
Hemoglobin: 11.9 g/dL — ABNORMAL LOW (ref 12.0–15.0)
MCH: 29.7 pg (ref 26.0–34.0)
MCHC: 32.4 g/dL (ref 30.0–36.0)
MCV: 91.5 fL (ref 78.0–100.0)
Platelets: 218 K/uL (ref 150–400)
RBC: 4.01 MIL/uL (ref 3.87–5.11)
RDW: 11.9 % (ref 11.5–15.5)
WBC: 4.6 K/uL (ref 4.0–10.5)

## 2011-10-20 LAB — APTT: aPTT: 30 s (ref 24–37)

## 2011-10-20 LAB — PROTIME-INR
INR: 1.06 (ref 0.00–1.49)
Prothrombin Time: 14 s (ref 11.6–15.2)

## 2011-10-20 LAB — D-DIMER, QUANTITATIVE: D-Dimer, Quant: 0.6 ug{FEU}/mL — ABNORMAL HIGH (ref 0.00–0.48)

## 2011-10-20 MED ORDER — TRAMADOL HCL 50 MG PO TABS: 50.0000 mg | ORAL_TABLET | Freq: Four times a day (QID) | ORAL | Status: AC | PRN

## 2011-10-20 MED ORDER — ENOXAPARIN SODIUM 100 MG/ML ~~LOC~~ SOLN
100.0000 mg | Freq: Two times a day (BID) | SUBCUTANEOUS | Status: DC
  Administered 2011-10-20: 100 mg via SUBCUTANEOUS
  Filled 2011-10-20 (×2): qty 1

## 2011-10-20 MED ORDER — ONDANSETRON HCL 4 MG/2ML IJ SOLN: 4.0000 mg | Freq: Four times a day (QID) | INTRAMUSCULAR | Status: DC | PRN

## 2011-10-20 MED ORDER — MORPHINE SULFATE 4 MG/ML IJ SOLN
4.0000 mg | INTRAMUSCULAR | Status: DC | PRN
  Administered 2011-10-20: 4 mg via INTRAVENOUS
  Filled 2011-10-20: qty 1

## 2011-10-20 MED ORDER — OXYCODONE-ACETAMINOPHEN 5-325 MG PO TABS
2.0000 | ORAL_TABLET | Freq: Once | ORAL | Status: AC
  Administered 2011-10-20: 2 via ORAL
  Filled 2011-10-20: qty 2

## 2011-10-20 MED ORDER — ACETAMINOPHEN 325 MG PO TABS: 650.0000 mg | ORAL_TABLET | ORAL | Status: DC | PRN

## 2011-10-20 NOTE — ED Notes (Signed)
Pt requesting pain med for pain in right leg.

## 2011-10-20 NOTE — Progress Notes (Signed)
VASCULAR LAB PRELIMINARY  PRELIMINARY  PRELIMINARY  PRELIMINARY  Right lower extremity venous duplex completed.    Preliminary report:  Right:  No evidence of DVT, superficial thrombosis, or Baker's cyst.  Malakye Nolden, RVS 10/20/2011, 9:12 AM

## 2011-10-20 NOTE — ED Notes (Signed)
Pt return from xray.

## 2011-10-20 NOTE — ED Notes (Signed)
Pt up to bathroom without any problems 

## 2011-10-20 NOTE — ED Notes (Signed)
Breakfast tray ordered 

## 2011-10-20 NOTE — ED Provider Notes (Signed)
History     CSN: [id number]  Arrival date & time 10/19/11  2031   First MD Initiated Contact with Patient 10/19/11 2347      Chief Complaint  Patient presents with  . Knee Pain  . Leg Swelling    (Consider location/radiation/quality/duration/timing/severity/associated sxs/prior treatment) HPI Comments: Complains of right knee pain and right leg swelling which the patient states started approximately 2-1/2 weeks ago. She was seen by her physician who started her on etodolac for pain which she states is not working.  The pain and swelling was gradual in onset, persistent, gradually getting worse but not associated with fevers or shortness of breath. She denies any history of trauma in the last 3 months, no surgery, no travel, no immobilization, no history of cancer, not a smoker. She does have a history of arthrocentesis to the right knee which occurred approximately 2 years ago because of poor cartilage and ligaments. She has had no surgery since that time. She is on her legs all day long at work, she works at [employer].  Patient is a 51 y.o. female presenting with knee pain. The history is provided by the patient and medical records.  Knee Pain    Past Medical History  Diagnosis Date  . Hypertension     Past Surgical History  Procedure Date  . Arthroscopy knee w/ drilling     History reviewed. No pertinent family history.  History  Substance Use Topics  . Smoking status: Never Smoker   . Smokeless tobacco: Not on file  . Alcohol Use: No    OB History    Grav Para Term Preterm Abortions TAB SAB Ect Mult Living                  Review of Systems  All other systems reviewed and are negative.    Allergies  Review of patient's allergies indicates no known allergies.  Home Medications   Current Outpatient Rx  Name Route Sig Dispense Refill  . ETODOLAC 400 MG PO TABS Oral Take 400 mg by mouth 2 (two) times daily as needed. For pain      BP  93/56  Pulse 70  Temp 97.8 F (36.6 C) (Oral)  Resp 18  Wt 224 lb 13.9 oz (102 kg)  SpO2 98%  LMP 10/12/2011  Physical Exam  Nursing note and vitals reviewed. Constitutional: She appears well-developed and well-nourished. No distress.  HENT:  Head: Normocephalic and atraumatic.  Mouth/Throat: Oropharynx is clear and moist. No oropharyngeal exudate.  Eyes: Conjunctivae normal and EOM are normal. Pupils are equal, round, and reactive to light. Right eye exhibits no discharge. Left eye exhibits no discharge. No scleral icterus.  Neck: Normal range of motion. Neck supple. No JVD present. No thyromegaly present.  Cardiovascular: Normal rate, regular rhythm, normal heart sounds and intact distal pulses.  Exam reveals no gallop and no friction rub.   No murmur heard. Pulmonary/Chest: Effort normal and breath sounds normal. No respiratory distress. She has no wheezes. She has no rales.  Abdominal: Soft. Bowel sounds are normal. She exhibits no distension and no mass. There is no tenderness.  Musculoskeletal: Normal range of motion. She exhibits tenderness. She exhibits no edema.       There is no pitting edema of the lower extremities to the right lower extremity is asymmetric to the left and is larger than the left. She has normal pulses at the feet, normal capillary refill, normal range of motion of all  of her joints of the bilateral lower extremities. There is mild tenderness with range of motion of the right knee. There is no crepitance, there is no clinical effusion. There is no redness to the leg  Lymphadenopathy:    She has no cervical adenopathy.  Neurological: She is alert. Coordination normal.  Skin: Skin is warm and dry. No rash noted. No erythema.  Psychiatric: She has a normal mood and affect. Her behavior is normal.    ED Course  Procedures (including critical care time)  Labs Reviewed  D-DIMER, QUANTITATIVE - Abnormal; Notable for the following:    D-Dimer, Quant 0.60 (*)       All other components within normal limits  CBC - Abnormal; Notable for the following:    Hemoglobin 11.9 (*)     All other components within normal limits  BASIC METABOLIC PANEL - Abnormal; Notable for the following:    Glucose, Bld 106 (*)     All other components within normal limits  PROTIME-INR  APTT   Dg Knee Complete 4 Views Right  10/20/2011  *RADIOLOGY REPORT*  Clinical Data: Right knee pain and swelling.  RIGHT KNEE - COMPLETE 4+ VIEW  Comparison: 03/14/2006 right tibia / fibula  Findings: Tricompartmental degenerative changes, most pronounced within the medial and patellofemoral compartments. No acute fracture or dislocation.  No definite joint effusion.  IMPRESSION: Tricompartmental DJD.  No acute osseous finding.   Original Report Authenticated By: Waneta Martins, M.D.      1. Arthritis of right knee       MDM  Overall the patient is in no acute distress with normal vital signs, no tachycardia, no hypoxia. She has been sent from urgent care for further workup. Due to her low risk status for venous thromboembolism, d-dimer has been ordered, if positive will put on protocol observation for ultrasound in the morning, x-ray to me pending to evaluate for arthritis. Percocet ordered for pain   Change of shift, care signed out to Dr. Jeraldine Loots.  D-dimer slightly elevated, x-ray shows tricompartmental arthritis, patient appears stable on my reevaluation this morning. She is aware of the plan including discharge home with either anticoagulant therapy or pain control for arthritis.     Vida Roller, MD 10/20/11 [phone]

## 2011-10-20 NOTE — ED Notes (Signed)
Pt resting quietly with eyes closed

## 2011-10-20 NOTE — ED Provider Notes (Signed)
Patient's negative [place] results were discussed with her.  No overnight complaints / notable RN concerns.  Patient d/c w analgesics, PMD F/U.   Gerhard Munch, MD 10/20/11 1007

## 2012-06-26 ENCOUNTER — Other Ambulatory Visit: Payer: Self-pay

## 2012-06-26 DIAGNOSIS — Z1231 Encounter for screening mammogram for malignant neoplasm of breast: Secondary | ICD-10-CM

## 2012-08-11 ENCOUNTER — Ambulatory Visit
Admission: RE | Admit: 2012-08-11 | Discharge: 2012-08-11 | Disposition: A | Discharge status: Home / Self Care | Payer: BC Managed Care – PPO | Source: Ambulatory Visit

## 2012-08-11 ENCOUNTER — Ambulatory Visit: Payer: BC Managed Care – PPO

## 2012-08-11 DIAGNOSIS — Z1231 Encounter for screening mammogram for malignant neoplasm of breast: Secondary | ICD-10-CM

## 2012-10-08 ENCOUNTER — Other Ambulatory Visit: Payer: Self-pay | Admitting: Family Medicine

## 2012-10-08 DIAGNOSIS — R1900 Intra-abdominal and pelvic swelling, mass and lump, unspecified site: Secondary | ICD-10-CM

## 2012-10-13 ENCOUNTER — Ambulatory Visit
Admission: RE | Admit: 2012-10-13 | Discharge: 2012-10-13 | Disposition: A | Discharge status: Home / Self Care | Payer: BC Managed Care – PPO | Source: Ambulatory Visit | Attending: Family Medicine | Admitting: Family Medicine

## 2012-10-13 ENCOUNTER — Other Ambulatory Visit: Payer: BC Managed Care – PPO

## 2012-10-13 DIAGNOSIS — R1900 Intra-abdominal and pelvic swelling, mass and lump, unspecified site: Secondary | ICD-10-CM

## 2012-10-30 ENCOUNTER — Ambulatory Visit (INDEPENDENT_AMBULATORY_CARE_PROVIDER_SITE_OTHER): Payer: BC Managed Care – PPO | Admitting: Surgery

## 2012-11-03 ENCOUNTER — Ambulatory Visit (INDEPENDENT_AMBULATORY_CARE_PROVIDER_SITE_OTHER): Payer: BC Managed Care – PPO | Admitting: Surgery

## 2012-11-03 ENCOUNTER — Encounter (INDEPENDENT_AMBULATORY_CARE_PROVIDER_SITE_OTHER): Payer: Self-pay | Admitting: Surgery

## 2012-11-03 VITALS — BP 104/72 | HR 75 | Temp 96.7°F | Resp 16 | Ht 66.0 in | Wt 220.4 lb

## 2012-11-03 DIAGNOSIS — E669 Obesity, unspecified: Secondary | ICD-10-CM

## 2012-11-03 DIAGNOSIS — K439 Ventral hernia without obstruction or gangrene: Secondary | ICD-10-CM | POA: Insufficient documentation

## 2012-11-03 NOTE — Patient Instructions (Signed)
See the Handout(s) we gave you.  Consider surgery.  Please call our office at [phone] if you wish to schedule surgery or if you have further questions / concerns.   Hernia A hernia occurs when an internal organ pushes out through a weak spot in the abdominal wall. Hernias most commonly occur in the groin and around the navel. Hernias often can be pushed back into place (reduced). Most hernias tend to get worse over time. Some abdominal hernias can get stuck in the opening (irreducible or incarcerated hernia) and cannot be reduced. An irreducible abdominal hernia which is tightly squeezed into the opening is at risk for impaired blood supply (strangulated hernia). A strangulated hernia is a medical emergency. Because of the risk for an irreducible or strangulated hernia, surgery may be recommended to repair a hernia. CAUSES   Heavy lifting.  Prolonged coughing.  Straining to have a bowel movement.  A cut (incision) made during an abdominal surgery. HOME CARE INSTRUCTIONS   Bed rest is not required. You may continue your normal activities.  Avoid lifting more than 10 pounds (4.5 kg) or straining.  Cough gently. If you are a smoker it is best to stop. Even the best hernia repair can break down with the continual strain of coughing. Even if you do not have your hernia repaired, a cough will continue to aggravate the problem.  Do not wear anything tight over your hernia. Do not try to keep it in with an outside bandage or truss. These can damage abdominal contents if they are trapped within the hernia sac.  Eat a normal diet.  Avoid constipation. Straining over long periods of time will increase hernia size and encourage breakdown of repairs. If you cannot do this with diet alone, stool softeners may be used. SEEK IMMEDIATE MEDICAL CARE IF:   You have a fever.  You develop increasing abdominal pain.  You feel nauseous or vomit.  Your hernia is stuck outside the abdomen, looks  discolored, feels hard, or is tender.  You have any changes in your bowel habits or in the hernia that are unusual for you.  You have increased pain or swelling around the hernia.  You cannot push the hernia back in place by applying gentle pressure while lying down. MAKE SURE YOU:   Understand these instructions.  Will watch your condition.  Will get help right away if you are not doing well or get worse. Document Released: 01/22/2005 Document Revised: 04/16/2011 Document Reviewed: 09/11/2007 [employer] Patient Information 2014 [employer].  HERNIA REPAIR: POST OP INSTRUCTIONS  1. DIET: Follow a light bland diet the first 24 hours after arrival home, such as soup, liquids, crackers, etc.  Be sure to include lots of fluids daily.  Avoid fast food or heavy meals as your are more likely to get nauseated.  Eat a low fat the next few days after surgery. 2. Take your usually prescribed home medications unless otherwise directed. 3. PAIN CONTROL: a. Pain is best controlled by a usual combination of three different methods TOGETHER: i. Ice/Heat ii. Over the counter pain medication iii. Prescription pain medication b. Most patients will experience some swelling and bruising around the hernia(s) such as the bellybutton, groins, or old incisions.  Ice packs or heating pads (30-60 minutes up to 6 times a day) will help. Use ice for the first few days to help decrease swelling and bruising, then switch to heat to help relax tight/sore spots and speed recovery.  Some people prefer to   use ice alone, heat alone, alternating between ice & heat.  Experiment to what works for you.  Swelling and bruising can take several weeks to resolve.   c. It is helpful to take an over-the-counter pain medication regularly for the first few weeks.  Choose one of the following that works best for you: i. Naproxen (Aleve, etc)  Two 220mg tabs twice a day ii. Ibuprofen (Advil, etc) Three 200mg tabs four times a day  (every meal & bedtime) iii. Acetaminophen (Tylenol, etc) 325-650mg four times a day (every meal & bedtime) d. A  prescription for pain medication should be given to you upon discharge.  Take your pain medication as prescribed.  i. If you are having problems/concerns with the prescription medicine (does not control pain, nausea, vomiting, rash, itching, etc), please call us [phone] to see if we need to switch you to a different pain medicine that will work better for you and/or control your side effect better. ii. If you need a refill on your pain medication, please contact your pharmacy.  They will contact our office to request authorization. Prescriptions will not be filled after 5 pm or on week-ends. 4. Avoid getting constipated.  Between the surgery and the pain medications, it is common to experience some constipation.  Increasing fluid intake and taking a fiber supplement (such as Metamucil, Citrucel, FiberCon, MiraLax, etc) 1-2 times a day regularly will usually help prevent this problem from occurring.  A mild laxative (prune juice, Milk of Magnesia, MiraLax, etc) should be taken according to package directions if there are no bowel movements after 48 hours.   5. Wash / shower every day.  You may shower over the dressings as they are waterproof.   6. Remove your waterproof bandages 5 days after surgery.  You may leave the incision open to air.  You may replace a dressing/Band-Aid to cover the incision for comfort if you wish.  Continue to shower over incision(s) after the dressing is off.    7. ACTIVITIES as tolerated:   a. You may resume regular (light) daily activities beginning the next day-such as daily self-care, walking, climbing stairs-gradually increasing activities as tolerated.  If you can walk 30 minutes without difficulty, it is safe to try more intense activity such as jogging, treadmill, bicycling, low-impact aerobics, swimming, etc. b. Save the most intensive and strenuous  activity for last such as sit-ups, heavy lifting, contact sports, etc  Refrain from any heavy lifting or straining until you are off narcotics for pain control.   c. DO NOT PUSH THROUGH PAIN.  Let pain be your guide: If it hurts to do something, don't do it.  Pain is your body warning you to avoid that activity for another week until the pain goes down. d. You may drive when you are no longer taking prescription pain medication, you can comfortably wear a seatbelt, and you can safely maneuver your car and apply brakes. e. You may have sexual intercourse when it is comfortable.  8. FOLLOW UP in our office a. Please call [facility] at [phone] to set up an appointment to see your surgeon in the office for a follow-up appointment approximately 2-3 weeks after your surgery. b. Make sure that you call for this appointment the day you arrive home to insure a convenient appointment time. 9.  IF YOU HAVE DISABILITY OR FAMILY LEAVE FORMS, BRING THEM TO THE OFFICE FOR PROCESSING.  DO NOT GIVE THEM TO YOUR DOCTOR.  WHEN TO CALL US (  [phone]: 1. Poor pain control 2. Reactions / problems with new medications (rash/itching, nausea, etc)  3. Fever over 101.5 F (38.5 C) 4. Inability to urinate 5. Nausea and/or vomiting 6. Worsening swelling or bruising 7. Continued bleeding from incision. 8. Increased pain, redness, or drainage from the incision   The clinic staff is available to answer your questions during regular business hours (8:30am-5pm).  Please don't hesitate to call and ask to speak to one of our nurses for clinical concerns.   If you have a medical emergency, go to the nearest emergency room or call 911.  A surgeon from [facility] is always on call at the hospitals in [place]  [facility] [address], [address], [place], [place]  [address] ?  [address] [address], [place], [place]   [address] MAIN: [phone] ? TOLL FREE: [phone] ? FAX: [phone] [email or web address]  

## 2012-11-03 NOTE — Progress Notes (Signed)
Subjective:     [name], female   DOB: [birth year], 52 y.o.   MRN: [id number]  HPI  Brandi Rivas  [birth year] [id number]  Patient Care Team: Benita Stabile, MD as PCP - General (Family Medicine)  This patient is a 52 y.o.female who presents today for surgical evaluation at the request of Dr. Clovis Riley.   Reason for visit: Abdominal mass.  Probable hernia.  Pleasant obese female.  Has had intermittent pains in her abdomen for several years.  She had a CAT scan five years ago which revealed a supraumbilical ventral wall midline hernia.  She has had recurrent symptoms.  No nausea or vomiting.  Has daily bowel movements.  Had a tubal ligation but no other abdominal surgeries.  Is on her feet all day and relatively active.  Based on concerns of abdominal soreness and a mass, ultrasound was done.  Confirmed ventral wall incisional hernia.  No abnormalities found.  Patient normally can he do whatever she wants.  No evidence of biliary colic or heartburn.  No history of jaundice or pancreatitis.  She is due to get her screening colonoscopy next month.  Patient Active Problem List   Diagnosis Date Noted  . Ventral hernia - supraumbilical 11/03/2012  . Obesity (BMI 30-39.9) 11/03/2012  . Pneumonia 01/09/2011    Past Medical History  Diagnosis Date  . Hypertension     Past Surgical History  Procedure Laterality Date  . Arthroscopy knee w/ drilling      History   Social History  . Marital Status: Single    Spouse Name: N/A    Number of Children: N/A  . Years of Education: N/A   Occupational History  . Not on file.   Social History Main Topics  . Smoking status: Never Smoker   . Smokeless tobacco: Not on file  . Alcohol Use: No  . Drug Use:   . Sexual Activity:    Other Topics Concern  . Not on file   Social History Narrative  . No narrative on file    No family history on file.  Current Outpatient Prescriptions  Medication Sig Dispense  Refill  . ibuprofen (ADVIL,MOTRIN) 200 MG tablet Take 200 mg by mouth every 6 (six) hours as needed for pain.      . naproxen sodium (ANAPROX) 220 MG tablet Take 220 mg by mouth 2 (two) times daily with a meal.       No current facility-administered medications for this visit.     No Known Allergies  BP 104/72  Pulse 75  Temp(Src) 96.7 F (35.9 C) (Temporal)  Resp 16  Ht 5\' 6"  (1.676 m)  Wt 220 lb 6.4 oz (99.973 kg)  BMI 35.59 kg/m2  SpO2 98%  US Abdomen Complete  10/13/2012   CLINICAL DATA:  Abdominal lung, question hernia  EXAM: ABDOMEN ULTRASOUND  COMPARISON:  CT 04/10/2007  FINDINGS: Gallbladder  No gallstones or wall thickening. Negative sonographic Murphy's sign.  Common bile duct  Diameter: 2 mm.  Liver  No focal lesion identified. Within normal limits in parenchymal echogenicity.  IVC  No abnormality visualized.  Pancreas  Obscured by bowel gas, not well visualized.  Spleen  Size and appearance within normal limits.  Right Kidney  Length: 11.5 cm. Echogenicity within normal limits. No mass or hydronephrosis visualized.  Left Kidney  Length: 11.3 cm. Echogenicity within normal limits. No mass or hydronephrosis visualized.  Abdominal aorta  No aneurysm.  Imaging above the umbilicus demonstrates  a ventral hernia which is likely similar to that seen on prior CT.  IMPRESSION: Supraumbilical ventral hernia as seen on prior CT from 2009. Otherwise unremarkable study.   Electronically Signed   By: Charlett Nose M.D.   On: 10/13/2012 16:51     Review of Systems  Constitutional: Negative for fever, chills, diaphoresis, appetite change and fatigue.  HENT: Negative for ear pain, sore throat, trouble swallowing, neck pain and ear discharge.   Eyes: Negative for photophobia, discharge and visual disturbance.  Respiratory: Negative for cough, choking, chest tightness and shortness of breath.   Cardiovascular: Negative for chest pain and palpitations.  Gastrointestinal: Positive for abdominal  pain. Negative for nausea, vomiting, diarrhea, constipation, blood in stool, anal bleeding and rectal pain.  Endocrine: Negative for cold intolerance and heat intolerance.  Genitourinary: Negative for dysuria, frequency and difficulty urinating.  Musculoskeletal: Negative for myalgias and gait problem.  Skin: Negative for color change, pallor and rash.  Allergic/Immunologic: Negative for environmental allergies, food allergies and immunocompromised state.  Neurological: Negative for dizziness, speech difficulty, weakness and numbness.  Hematological: Negative for adenopathy.  Psychiatric/Behavioral: Negative for confusion and agitation. The patient is not nervous/anxious.        Objective:   Physical Exam  Constitutional: She is oriented to person, place, and time. She appears well-developed and well-nourished. No distress.  HENT:  Head: Normocephalic.  Mouth/Throat: Oropharynx is clear and moist. No oropharyngeal exudate.  Eyes: Conjunctivae and EOM are normal. Pupils are equal, round, and reactive to light. No scleral icterus.  Neck: Normal range of motion. Neck supple. No tracheal deviation present.  Cardiovascular: Normal rate, regular rhythm and intact distal pulses.   Pulmonary/Chest: Effort normal and breath sounds normal. No stridor. No respiratory distress. She exhibits no tenderness.  Abdominal: Soft. She exhibits no distension and no mass. There is no tenderness. There is no rigidity, no rebound, no guarding, no tenderness at McBurney's point and negative Murphy's sign. A hernia is present. Hernia confirmed positive in the ventral area. Hernia confirmed negative in the right inguinal area and confirmed negative in the left inguinal area.    Genitourinary: No vaginal discharge found.  Musculoskeletal: Normal range of motion. She exhibits no tenderness.       Right elbow: She exhibits normal range of motion.       Left elbow: She exhibits normal range of motion.       Right  wrist: She exhibits normal range of motion.       Left wrist: She exhibits normal range of motion.       Right hand: Normal strength noted.       Left hand: Normal strength noted.  Lymphadenopathy:       Head (right side): No posterior auricular adenopathy present.       Head (left side): No posterior auricular adenopathy present.    She has no cervical adenopathy.    She has no axillary adenopathy.       Right: No inguinal adenopathy present.       Left: No inguinal adenopathy present.  Neurological: She is alert and oriented to person, place, and time. No cranial nerve deficit. She exhibits normal muscle tone. Coordination normal.  Skin: Skin is warm and dry. No rash noted. She is not diaphoretic. No erythema.  Psychiatric: She has a normal mood and affect. Her behavior is normal. Judgment and thought content normal.       Assessment:     Supraumbilical ventral hernia an obese  female.  Symptomatic.     Plan:     Because she is symptomatic and it is of moderate size, I recommended consideration of reduction and repair.  I would recommend mesh reinforcement given her obesity.  I prefer to do diagnostic laparoscopy to make sure there is no umbilical hernia as well and make sure and good underlay overlap.  Discussed this with the patient.  She is interested in proceeding but wishes to check with her job to figure out how to set up time off for recovery:  The anatomy & physiology of the abdominal wall was discussed.  The pathophysiology of hernias was discussed.  Natural history risks without surgery including progeressive enlargement, pain, incarceration & strangulation was discussed.   Contributors to complications such as smoking, obesity, diabetes, prior surgery, etc were discussed.   I feel the risks of no intervention will lead to serious problems that outweigh the operative risks; therefore, I recommended surgery to reduce and repair the hernia.  I explained laparoscopic techniques  with possible need for an open approach.  I noted the probable use of mesh to patch and/or buttress the hernia repair  Risks such as bleeding, infection, abscess, need for further treatment, heart attack, death, and other risks were discussed.  I noted a good likelihood this will help address the problem.   Goals of post-operative recovery were discussed as well.  Possibility that this will not correct all symptoms was explained.  I stressed the importance of low-impact activity, aggressive pain control, avoiding constipation, & not pushing through pain to minimize risk of post-operative chronic pain or injury. Possibility of reherniation especially with smoking, obesity, diabetes, immunosuppression, and other health conditions was discussed.  We will work to minimize complications.     An educational handout further explaining the pathology & treatment options was given as well.  Questions were answered.  The patient expresses understanding & wishes to proceed with surgery.

## 2012-11-07 ENCOUNTER — Telehealth (INDEPENDENT_AMBULATORY_CARE_PROVIDER_SITE_OTHER): Payer: Self-pay | Admitting: Surgery

## 2012-11-10 ENCOUNTER — Other Ambulatory Visit (INDEPENDENT_AMBULATORY_CARE_PROVIDER_SITE_OTHER): Payer: Self-pay | Admitting: Surgery

## 2012-11-12 ENCOUNTER — Encounter (INDEPENDENT_AMBULATORY_CARE_PROVIDER_SITE_OTHER): Payer: Self-pay | Admitting: Surgery

## 2012-11-12 ENCOUNTER — Telehealth (INDEPENDENT_AMBULATORY_CARE_PROVIDER_SITE_OTHER): Payer: Self-pay | Admitting: Surgery

## 2012-11-12 NOTE — Telephone Encounter (Signed)
Pt made aware of financial obligation will call back to schedule surgery

## 2012-12-04 ENCOUNTER — Encounter (HOSPITAL_COMMUNITY): Payer: Self-pay | Admitting: Pharmacy Technician

## 2012-12-05 ENCOUNTER — Encounter (HOSPITAL_COMMUNITY): Payer: Self-pay

## 2012-12-05 ENCOUNTER — Other Ambulatory Visit (HOSPITAL_COMMUNITY): Payer: Self-pay | Admitting: *Deleted

## 2012-12-05 ENCOUNTER — Encounter (HOSPITAL_COMMUNITY)
Admission: RE | Admit: 2012-12-05 | Discharge: 2012-12-05 | Disposition: A | Discharge status: Home / Self Care | Payer: BC Managed Care – PPO | Source: Ambulatory Visit | Attending: Surgery | Admitting: Surgery

## 2012-12-05 DIAGNOSIS — Z01812 Encounter for preprocedural laboratory examination: Secondary | ICD-10-CM | POA: Insufficient documentation

## 2012-12-05 DIAGNOSIS — Z01818 Encounter for other preprocedural examination: Secondary | ICD-10-CM | POA: Insufficient documentation

## 2012-12-05 HISTORY — DX: Nasal congestion: R09.81

## 2012-12-05 HISTORY — DX: Ventral hernia without obstruction or gangrene: K43.9

## 2012-12-05 HISTORY — DX: Unspecified osteoarthritis, unspecified site: M19.90

## 2012-12-05 LAB — CBC
HCT: 35.1 % — ABNORMAL LOW (ref 36.0–46.0)
Hemoglobin: 11.3 g/dL — ABNORMAL LOW (ref 12.0–15.0)
MCH: 29.3 pg (ref 26.0–34.0)
MCHC: 32.2 g/dL (ref 30.0–36.0)
MCV: 90.9 fL (ref 78.0–100.0)
Platelets: 232 10*3/uL (ref 150–400)
RBC: 3.86 MIL/uL — ABNORMAL LOW (ref 3.87–5.11)
RDW: 12.5 % (ref 11.5–15.5)
WBC: 4.8 10*3/uL (ref 4.0–10.5)

## 2012-12-05 LAB — PREGNANCY, URINE: Preg Test, Ur: NEGATIVE

## 2012-12-05 NOTE — Patient Instructions (Addendum)
Brandi Rivas  12/05/2012                           YOUR PROCEDURE IS SCHEDULED ON:  12/09/12               PLEASE REPORT TO SHORT STAY CENTER AT : 5:30 AM               CALL THIS NUMBER IF ANY PROBLEMS THE DAY OF SURGERY :               [phone]                      REMEMBER:   Do not eat food or drink liquids AFTER MIDNIGHT   Take these medicines the morning of surgery with A SIP OF WATER: NONE    Do not wear jewelry, make-up   Do not wear lotions, powders, or perfumes.   Do not shave legs or underarms 12 hrs. before surgery (men may shave face)  Do not bring valuables to the hospital.  Contacts, dentures or bridgework may not be worn into surgery.  Leave suitcase in the car. After surgery it may be brought to your room.  For patients admitted to the hospital more than one night, checkout time is 11:00                          The day of discharge.   Patients discharged the day of surgery will not be allowed to drive home                             If going home same day of surgery, must have someone stay with you first                           24 hrs at home and arrange for some one to drive you home from hospital.    Special Instructions:   Please read over the following fact sheets that you were given:              1. [facility] [place] PREPARING FOR SURGERY SHEET              2. STOP ALL ASPIRIN AND HERBAL MEDS 5 DAYS PREOP                                                X_____________________________________________________________________        Failure to follow these instructions may result in cancellation of your surgery

## 2012-12-08 NOTE — Anesthesia Preprocedure Evaluation (Addendum)
Anesthesia Evaluation  Patient identified by MRN, date of birth, ID band Patient awake    Reviewed: Allergy & Precautions, H&P , NPO status , Patient's Chart, lab work & pertinent test results  Airway Mallampati: II TM Distance: >3 FB Neck ROM: full    Dental no notable dental hx. (+) Teeth Intact and Dental Advisory Given   Pulmonary neg pulmonary ROS,  breath sounds clear to auscultation  Pulmonary exam normal       Cardiovascular Exercise Tolerance: Good hypertension, negative cardio ROS  Rhythm:regular Rate:Normal  No meds for htn. Taken off because improved   Neuro/Psych negative neurological ROS  negative psych ROS   GI/Hepatic negative GI ROS, Neg liver ROS,   Endo/Other  negative endocrine ROS  Renal/GU negative Renal ROS  negative genitourinary   Musculoskeletal   Abdominal   Peds  Hematology negative hematology ROS (+)   Anesthesia Other Findings   Reproductive/Obstetrics negative OB ROS                          Anesthesia Physical Anesthesia Plan  ASA: II  Anesthesia Plan: General   Post-op Pain Management:    Induction: Intravenous  Airway Management Planned: Oral ETT  Additional Equipment:   Intra-op Plan:   Post-operative Plan: Extubation in OR  Informed Consent: I have reviewed the patients History and Physical, chart, labs and discussed the procedure including the risks, benefits and alternatives for the proposed anesthesia with the patient or authorized representative who has indicated his/her understanding and acceptance.   Dental Advisory Given  Plan Discussed with: CRNA and Surgeon  Anesthesia Plan Comments:         Anesthesia Quick Evaluation

## 2012-12-09 ENCOUNTER — Encounter (HOSPITAL_COMMUNITY)
Admission: RE | Disposition: A | Discharge status: Home / Self Care | Payer: Self-pay | Source: Ambulatory Visit | Attending: Surgery

## 2012-12-09 ENCOUNTER — Encounter (HOSPITAL_COMMUNITY): Payer: BC Managed Care – PPO | Admitting: Anesthesiology

## 2012-12-09 ENCOUNTER — Ambulatory Visit (HOSPITAL_COMMUNITY): Payer: BC Managed Care – PPO | Admitting: Anesthesiology

## 2012-12-09 ENCOUNTER — Observation Stay (HOSPITAL_COMMUNITY)
Admission: RE | Admit: 2012-12-09 | Discharge: 2012-12-11 | Disposition: A | Discharge status: Home / Self Care | Payer: BC Managed Care – PPO | Source: Ambulatory Visit | Attending: Surgery | Admitting: Surgery

## 2012-12-09 ENCOUNTER — Encounter (HOSPITAL_COMMUNITY): Payer: Self-pay | Admitting: *Deleted

## 2012-12-09 DIAGNOSIS — R339 Retention of urine, unspecified: Secondary | ICD-10-CM | POA: Insufficient documentation

## 2012-12-09 DIAGNOSIS — K436 Other and unspecified ventral hernia with obstruction, without gangrene: Secondary | ICD-10-CM

## 2012-12-09 DIAGNOSIS — E669 Obesity, unspecified: Secondary | ICD-10-CM | POA: Diagnosis present

## 2012-12-09 DIAGNOSIS — Z6830 Body mass index (BMI) 30.0-30.9, adult: Secondary | ICD-10-CM | POA: Insufficient documentation

## 2012-12-09 DIAGNOSIS — K439 Ventral hernia without obstruction or gangrene: Secondary | ICD-10-CM | POA: Diagnosis present

## 2012-12-09 DIAGNOSIS — R338 Other retention of urine: Secondary | ICD-10-CM

## 2012-12-09 HISTORY — DX: Pneumonia, unspecified organism: J18.9

## 2012-12-09 HISTORY — PX: VENTRAL HERNIA REPAIR: SHX424

## 2012-12-09 HISTORY — PX: INSERTION OF MESH: SHX5868

## 2012-12-09 LAB — CBC
HCT: 31.9 % — ABNORMAL LOW (ref 36.0–46.0)
Hemoglobin: 10.2 g/dL — ABNORMAL LOW (ref 12.0–15.0)
MCH: 29.1 pg (ref 26.0–34.0)
MCHC: 32 g/dL (ref 30.0–36.0)
MCV: 90.9 fL (ref 78.0–100.0)
Platelets: 174 10*3/uL (ref 150–400)
RBC: 3.51 MIL/uL — ABNORMAL LOW (ref 3.87–5.11)
RDW: 12.5 % (ref 11.5–15.5)
WBC: 7.2 10*3/uL (ref 4.0–10.5)

## 2012-12-09 LAB — CREATININE, SERUM
Creatinine, Ser: 0.71 mg/dL (ref 0.50–1.10)
GFR calc Af Amer: >90 mL/min (ref >90)
GFR calc non Af Amer: >90 mL/min (ref >90)

## 2012-12-09 SURGERY — REPAIR, HERNIA, VENTRAL, LAPAROSCOPIC
Anesthesia: General | Site: Abdomen | Wound class: Clean

## 2012-12-09 MED ORDER — CHLORHEXIDINE GLUCONATE 4 % EX LIQD: 1.0000 "application " | Freq: Once | CUTANEOUS | Status: DC

## 2012-12-09 MED ORDER — METRONIDAZOLE IN NACL 5-0.79 MG/ML-% IV SOLN
500.0000 mg | Freq: Once | INTRAVENOUS | Status: AC
  Administered 2012-12-09: 500 mg via INTRAVENOUS

## 2012-12-09 MED ORDER — NAPROXEN 250 MG PO TABS
250.0000 mg | ORAL_TABLET | Freq: Two times a day (BID) | ORAL | Status: DC
  Administered 2012-12-09: 250 mg via ORAL
  Filled 2012-12-09 (×4): qty 1

## 2012-12-09 MED ORDER — FENTANYL CITRATE 0.05 MG/ML IJ SOLN
INTRAMUSCULAR | Status: DC | PRN
  Administered 2012-12-09: 50 ug via INTRAVENOUS
  Administered 2012-12-09: 100 ug via INTRAVENOUS

## 2012-12-09 MED ORDER — BUPIVACAINE-EPINEPHRINE 0.25% -1:200000 IJ SOLN
INTRAMUSCULAR | Status: DC | PRN
  Administered 2012-12-09: 50 mL
  Administered 2012-12-09: 40 mL

## 2012-12-09 MED ORDER — HYDROMORPHONE HCL PF 1 MG/ML IJ SOLN
0.5000 mg | INTRAMUSCULAR | Status: DC | PRN
Start: 2012-12-09 — End: 2012-12-11
  Administered 2012-12-09 – 2012-12-10 (×5): 1 mg via INTRAVENOUS
  Filled 2012-12-09 (×5): qty 1

## 2012-12-09 MED ORDER — EPHEDRINE SULFATE 50 MG/ML IJ SOLN
INTRAMUSCULAR | Status: DC | PRN
  Administered 2012-12-09: 10 mg via INTRAVENOUS

## 2012-12-09 MED ORDER — ALUM & MAG HYDROXIDE-SIMETH 200-200-20 MG/5ML PO SUSP: 30.0000 mL | Freq: Four times a day (QID) | ORAL | Status: DC | PRN

## 2012-12-09 MED ORDER — NEOSTIGMINE METHYLSULFATE 1 MG/ML IJ SOLN
INTRAMUSCULAR | Status: DC | PRN
  Administered 2012-12-09: 4 mg via INTRAVENOUS

## 2012-12-09 MED ORDER — CEFAZOLIN SODIUM-DEXTROSE 2-3 GM-% IV SOLR
2.0000 g | Freq: Four times a day (QID) | INTRAVENOUS | Status: AC
  Administered 2012-12-09 – 2012-12-10 (×3): 2 g via INTRAVENOUS
  Filled 2012-12-09 (×3): qty 50

## 2012-12-09 MED ORDER — ONDANSETRON HCL 4 MG/2ML IJ SOLN
INTRAMUSCULAR | Status: DC | PRN
  Administered 2012-12-09: 4 mg via INTRAVENOUS

## 2012-12-09 MED ORDER — PHENYLEPHRINE HCL 10 MG/ML IJ SOLN
INTRAMUSCULAR | Status: DC | PRN
  Administered 2012-12-09: 80 ug via INTRAVENOUS

## 2012-12-09 MED ORDER — SODIUM CHLORIDE 0.9 % IJ SOLN: 3.0000 mL | INTRAMUSCULAR | Status: DC | PRN

## 2012-12-09 MED ORDER — OXYCODONE HCL 5 MG PO TABS: 5.0000 mg | ORAL_TABLET | ORAL | Status: DC | PRN

## 2012-12-09 MED ORDER — MAGIC MOUTHWASH
15.0000 mL | Freq: Four times a day (QID) | ORAL | Status: DC | PRN
  Filled 2012-12-09: qty 15

## 2012-12-09 MED ORDER — BUPIVACAINE-EPINEPHRINE 0.25% -1:200000 IJ SOLN
INTRAMUSCULAR | Status: AC
  Filled 2012-12-09: qty 1

## 2012-12-09 MED ORDER — LACTATED RINGERS IV SOLN
INTRAVENOUS | Status: DC | PRN
  Administered 2012-12-09 (×3): via INTRAVENOUS

## 2012-12-09 MED ORDER — NAPROXEN SODIUM 220 MG PO TABS: 220.0000 mg | ORAL_TABLET | Freq: Two times a day (BID) | ORAL | Status: DC

## 2012-12-09 MED ORDER — 0.9 % SODIUM CHLORIDE (POUR BTL) OPTIME
TOPICAL | Status: DC | PRN
  Administered 2012-12-09: 1000 mL

## 2012-12-09 MED ORDER — INFLUENZA VAC SPLIT QUAD 0.5 ML IM SUSP
0.5000 mL | INTRAMUSCULAR | Status: AC
  Administered 2012-12-10: 0.5 mL via INTRAMUSCULAR
  Filled 2012-12-09 (×2): qty 0.5

## 2012-12-09 MED ORDER — LIP MEDEX EX OINT
1.0000 "application " | TOPICAL_OINTMENT | Freq: Two times a day (BID) | CUTANEOUS | Status: DC
  Administered 2012-12-09 – 2012-12-11 (×5): 1 via TOPICAL
  Filled 2012-12-09 (×2): qty 7

## 2012-12-09 MED ORDER — BUPIVACAINE 0.25 % ON-Q PUMP DUAL CATH 300 ML
300.0000 mL | INJECTION | Status: DC
  Administered 2012-12-09: 300 mL
  Filled 2012-12-09: qty 300

## 2012-12-09 MED ORDER — LACTATED RINGERS IR SOLN
Status: DC | PRN
  Administered 2012-12-09: 1000 mL

## 2012-12-09 MED ORDER — PSYLLIUM 95 % PO PACK
1.0000 | PACK | Freq: Two times a day (BID) | ORAL | Status: DC
  Administered 2012-12-09 – 2012-12-11 (×5): 1 via ORAL
  Filled 2012-12-09 (×6): qty 1

## 2012-12-09 MED ORDER — LACTATED RINGERS IV BOLUS (SEPSIS): 1000.0000 mL | Freq: Three times a day (TID) | INTRAVENOUS | Status: DC | PRN

## 2012-12-09 MED ORDER — ONDANSETRON HCL 4 MG/2ML IJ SOLN
4.0000 mg | Freq: Four times a day (QID) | INTRAMUSCULAR | Status: DC | PRN
  Administered 2012-12-09 (×2): 4 mg via INTRAVENOUS
  Filled 2012-12-09 (×2): qty 2

## 2012-12-09 MED ORDER — GLYCOPYRROLATE 0.2 MG/ML IJ SOLN
INTRAMUSCULAR | Status: DC | PRN
  Administered 2012-12-09: 0.6 mg via INTRAVENOUS

## 2012-12-09 MED ORDER — CEFAZOLIN SODIUM-DEXTROSE 2-3 GM-% IV SOLR
2.0000 g | INTRAVENOUS | Status: AC
  Administered 2012-12-09: 2 g via INTRAVENOUS

## 2012-12-09 MED ORDER — SODIUM CHLORIDE 0.9 % IV SOLN: 250.0000 mL | INTRAVENOUS | Status: DC | PRN

## 2012-12-09 MED ORDER — METRONIDAZOLE IN NACL 5-0.79 MG/ML-% IV SOLN
500.0000 mg | Freq: Three times a day (TID) | INTRAVENOUS | Status: AC
  Administered 2012-12-09 (×2): 500 mg via INTRAVENOUS
  Filled 2012-12-09 (×2): qty 100

## 2012-12-09 MED ORDER — CEFAZOLIN SODIUM-DEXTROSE 2-3 GM-% IV SOLR
INTRAVENOUS | Status: AC
  Filled 2012-12-09: qty 50

## 2012-12-09 MED ORDER — HYDROMORPHONE HCL PF 1 MG/ML IJ SOLN
INTRAMUSCULAR | Status: AC
  Filled 2012-12-09: qty 1

## 2012-12-09 MED ORDER — PROMETHAZINE HCL 25 MG/ML IJ SOLN: 12.5000 mg | Freq: Four times a day (QID) | INTRAMUSCULAR | Status: DC | PRN

## 2012-12-09 MED ORDER — METRONIDAZOLE IN NACL 5-0.79 MG/ML-% IV SOLN
INTRAVENOUS | Status: AC
  Filled 2012-12-09: qty 100

## 2012-12-09 MED ORDER — BUPIVACAINE 0.25 % ON-Q PUMP DUAL CATH 300 ML
300.0000 mL | INJECTION | Status: DC
  Filled 2012-12-09: qty 300

## 2012-12-09 MED ORDER — OXYCODONE HCL 5 MG PO TABS
5.0000 mg | ORAL_TABLET | ORAL | Status: DC | PRN
  Administered 2012-12-10 – 2012-12-11 (×6): 10 mg via ORAL
  Filled 2012-12-09 (×7): qty 2

## 2012-12-09 MED ORDER — PROPOFOL 10 MG/ML IV BOLUS
INTRAVENOUS | Status: DC | PRN
  Administered 2012-12-09: 150 mg via INTRAVENOUS

## 2012-12-09 MED ORDER — HYDROMORPHONE HCL PF 1 MG/ML IJ SOLN
0.2500 mg | INTRAMUSCULAR | Status: DC | PRN
  Administered 2012-12-09 (×4): 0.5 mg via INTRAVENOUS

## 2012-12-09 MED ORDER — DEXAMETHASONE SODIUM PHOSPHATE 10 MG/ML IJ SOLN
INTRAMUSCULAR | Status: DC | PRN
  Administered 2012-12-09: 10 mg via INTRAVENOUS

## 2012-12-09 MED ORDER — HEPARIN SODIUM (PORCINE) 5000 UNIT/ML IJ SOLN
5000.0000 [IU] | Freq: Three times a day (TID) | INTRAMUSCULAR | Status: DC
  Administered 2012-12-09 – 2012-12-11 (×5): 5000 [IU] via SUBCUTANEOUS
  Filled 2012-12-09 (×8): qty 1

## 2012-12-09 MED ORDER — BISACODYL 10 MG RE SUPP: 10.0000 mg | Freq: Two times a day (BID) | RECTAL | Status: DC | PRN

## 2012-12-09 MED ORDER — DIPHENHYDRAMINE HCL 50 MG/ML IJ SOLN: 12.5000 mg | Freq: Four times a day (QID) | INTRAMUSCULAR | Status: DC | PRN

## 2012-12-09 MED ORDER — LACTATED RINGERS IV SOLN: INTRAVENOUS | Status: DC

## 2012-12-09 MED ORDER — MIDAZOLAM HCL 5 MG/5ML IJ SOLN
INTRAMUSCULAR | Status: DC | PRN
  Administered 2012-12-09: 2 mg via INTRAVENOUS

## 2012-12-09 MED ORDER — CISATRACURIUM BESYLATE (PF) 10 MG/5ML IV SOLN
INTRAVENOUS | Status: DC | PRN
  Administered 2012-12-09: 2 mg via INTRAVENOUS
  Administered 2012-12-09: 10 mg via INTRAVENOUS

## 2012-12-09 MED ORDER — METOPROLOL TARTRATE 12.5 MG HALF TABLET
12.5000 mg | ORAL_TABLET | Freq: Two times a day (BID) | ORAL | Status: DC | PRN
  Filled 2012-12-09: qty 1

## 2012-12-09 MED ORDER — FLUTICASONE PROPIONATE 50 MCG/ACT NA SUSP: 2.0000 | Freq: Every day | NASAL | Status: DC | PRN

## 2012-12-09 MED ORDER — ATROPINE SULFATE 0.4 MG/ML IJ SOLN
INTRAMUSCULAR | Status: DC | PRN
  Administered 2012-12-09 (×2): 0.2 mg via INTRAVENOUS

## 2012-12-09 MED ORDER — MAGNESIUM HYDROXIDE 400 MG/5ML PO SUSP: 30.0000 mL | Freq: Two times a day (BID) | ORAL | Status: DC | PRN

## 2012-12-09 MED ORDER — SODIUM CHLORIDE 0.9 % IJ SOLN: 3.0000 mL | Freq: Two times a day (BID) | INTRAMUSCULAR | Status: DC

## 2012-12-09 SURGICAL SUPPLY — 50 items
APPLIER CLIP 5 13 M/L LIGAMAX5 (MISCELLANEOUS)
APR CLP MED LRG 5 ANG JAW (MISCELLANEOUS)
BINDER ABD UNIV 12 45-62 (WOUND CARE) ×1 IMPLANT
BINDER ABDOMINAL 46IN 62IN (WOUND CARE) ×2
BLADE HEX COATED 2.75 (ELECTRODE) IMPLANT
CABLE HI FREQUENCY MONOPOLAR (ELECTROSURGICAL) ×2 IMPLANT
CANISTER SUCTION 2500CC (MISCELLANEOUS) ×2 IMPLANT
CATH KIT ON Q 7.5IN SLV (PAIN MANAGEMENT) ×4 IMPLANT
CHLORAPREP W/TINT 26ML (MISCELLANEOUS) ×2 IMPLANT
CLIP APPLIE 5 13 M/L LIGAMAX5 (MISCELLANEOUS) IMPLANT
DECANTER SPIKE VIAL GLASS SM (MISCELLANEOUS) ×4 IMPLANT
DEVICE SECURE STRAP 25 ABSORB (INSTRUMENTS) ×2 IMPLANT
DEVICE TROCAR PUNCTURE CLOSURE (ENDOMECHANICALS) ×2 IMPLANT
DRAPE LAPAROSCOPIC ABDOMINAL (DRAPES) ×2 IMPLANT
DRAPE UTILITY XL STRL (DRAPES) ×2 IMPLANT
DRAPE WARM FLUID 44X44 (DRAPE) ×2 IMPLANT
DRSG TEGADERM 2-3/8X2-3/4 SM (GAUZE/BANDAGES/DRESSINGS) ×8 IMPLANT
DRSG TEGADERM 4X4.75 (GAUZE/BANDAGES/DRESSINGS) ×2 IMPLANT
ELECT REM PT RETURN 9FT ADLT (ELECTROSURGICAL) ×2
ELECTRODE REM PT RTRN 9FT ADLT (ELECTROSURGICAL) ×1 IMPLANT
GAUZE SPONGE 2X2 8PLY STRL LF (GAUZE/BANDAGES/DRESSINGS) ×1 IMPLANT
GLOVE BIOGEL PI IND STRL 7.0 (GLOVE) ×2 IMPLANT
GLOVE BIOGEL PI IND STRL 7.5 (GLOVE) ×2 IMPLANT
GLOVE BIOGEL PI INDICATOR 7.0 (GLOVE) ×2
GLOVE BIOGEL PI INDICATOR 7.5 (GLOVE) ×2
GLOVE ECLIPSE 8.0 STRL XLNG CF (GLOVE) ×4 IMPLANT
GLOVE INDICATOR 8.0 STRL GRN (GLOVE) ×2 IMPLANT
GOWN STRL REIN XL XLG (GOWN DISPOSABLE) ×8 IMPLANT
KIT BASIN OR (CUSTOM PROCEDURE TRAY) ×2 IMPLANT
MARKER SKIN DUAL TIP RULER LAB (MISCELLANEOUS) ×2 IMPLANT
MESH VENTRALIGHT ST 8X10 (Mesh General) ×2 IMPLANT
NEEDLE SPNL 22GX3.5 QUINCKE BK (NEEDLE) ×2 IMPLANT
PENCIL BUTTON HOLSTER BLD 10FT (ELECTRODE) IMPLANT
SCALPEL HARMONIC ACE (MISCELLANEOUS) IMPLANT
SCISSORS LAP 5X35 DISP (ENDOMECHANICALS) ×2 IMPLANT
SET IRRIG TUBING LAPAROSCOPIC (IRRIGATION / IRRIGATOR) ×2 IMPLANT
SLEEVE XCEL OPT CAN 5 100 (ENDOMECHANICALS) ×4 IMPLANT
SPONGE GAUZE 2X2 STER 10/PKG (GAUZE/BANDAGES/DRESSINGS) ×1
STRIP CLOSURE SKIN 1/2X4 (GAUZE/BANDAGES/DRESSINGS) ×6 IMPLANT
SUT MNCRL AB 4-0 PS2 18 (SUTURE) ×2 IMPLANT
SUT PDS AB 1 CTX 36 (SUTURE) ×4 IMPLANT
SUT PROLENE 1 CT 1 30 (SUTURE) ×18 IMPLANT
TOWEL OR 17X26 10 PK STRL BLUE (TOWEL DISPOSABLE) ×2 IMPLANT
TOWEL OR NON WOVEN STRL DISP B (DISPOSABLE) ×2 IMPLANT
TRAY FOLEY CATH 14FRSI W/METER (CATHETERS) ×2 IMPLANT
TRAY LAP CHOLE (CUSTOM PROCEDURE TRAY) ×2 IMPLANT
TROCAR BLADELESS OPT 5 100 (ENDOMECHANICALS) ×2 IMPLANT
TROCAR XCEL NON-BLD 11X100MML (ENDOMECHANICALS) IMPLANT
TUBING INSUFFLATION 10FT LAP (TUBING) ×2 IMPLANT
TUNNELER SHEATH ON-Q 16GX12 DP (PAIN MANAGEMENT) ×2 IMPLANT

## 2012-12-09 NOTE — H&P (Signed)
Brandi Rivas  1960/12/24 [id number]  CARE TEAM:  PCP: Lupe Carney, MD  Outpatient Care Team: Patient Care Team: Lupe Carney, MD as PCP - General (Family Medicine)  Inpatient Treatment Team: Treatment Team: Attending Provider: Ardeth Sportsman, MD  This patient is a 52 y.o.female who presents today for surgical evaluation.   Reason for evaluation: Supraumbilical ventral hernia  Pleasant obese female. Has had intermittent pains in her abdomen for several years. She had a CAT scan five years ago which revealed a supraumbilical ventral wall midline hernia. She has had recurrent symptoms. No nausea or vomiting. Has daily bowel movements. Had a tubal ligation but no other abdominal surgeries. Is on her feet all day and relatively active. Based on concerns of abdominal soreness and a mass, ultrasound was done. Confirmed ventral wall incisional hernia. No abnormalities found. Patient normally can he do whatever she wants. No evidence of biliary colic or heartburn. No history of jaundice or pancreatitis. She is due to get her screening colonoscopy next month.  No new events since last visit.  Past Medical History  Diagnosis Date  . Hypertension     PREVIOUSLY TOOK MED - BP STABLE - TAKEN OFF MED  . Sinus congestion   . Arthritis   . Ventral hernia     Past Surgical History  Procedure Laterality Date  . Arthroscopy knee w/ drilling      History   Social History  . Marital Status: Single    Spouse Name: N/A    Number of Children: N/A  . Years of Education: N/A   Occupational History  . CASHIER     [employer]   Social History Main Topics  . Smoking status: Never Smoker   . Smokeless tobacco: Not on file  . Alcohol Use: No  . Drug Use: No  . Sexual Activity: Not on file   Other Topics Concern  . Not on file   Social History Narrative  . No narrative on file    History reviewed. No pertinent family history.  Current Facility-Administered Medications  Medication  Dose Route Frequency Provider Last Rate Last Dose  . ceFAZolin (ANCEF) IVPB 2 g/50 mL premix  2 g Intravenous 60 min Pre-Op Ardeth Sportsman, MD      . chlorhexidine (HIBICLENS) 4 % liquid 1 application  1 application Topical Once Ardeth Sportsman, MD      . Melene Muller ON 12/10/2012] chlorhexidine (HIBICLENS) 4 % liquid 1 application  1 application Topical Once Ardeth Sportsman, MD       Facility-Administered Medications Ordered in Other Encounters  Medication Dose Route Frequency Provider Last Rate Last Dose  . lactated ringers infusion    Continuous PRN Doran Clay, CRNA         No Known Allergies  ROS: Constitutional:  No fevers, chills, sweats.  Weight stable Eyes:  No vision changes, No discharge HENT:  No sore throats, nasal drainage Lymph: No neck swelling, No bruising easily Pulmonary:  No cough, productive sputum CV: No orthopnea, PND  Patient walks 30 minutes for about 1 miles without difficulty.  No exertional chest/neck/shoulder/arm pain. GI: No personal nor family history of GI/colon cancer, inflammatory bowel disease, irritable bowel syndrome, allergy such as Celiac Sprue, dietary/dairy problems, colitis, ulcers nor gastritis.  No recent sick contacts/gastroenteritis.  No travel outside the country.  No changes in diet. Renal: No UTIs, No hematuria Genital:  No drainage, bleeding, masses Musculoskeletal: No severe joint pain.  Good ROM major joints Skin:  No sores or lesions.  No rashes Heme/Lymph:  No easy bleeding.  No swollen lymph nodes Neuro: No focal weakness/numbness.  No seizures Psych: No suicidal ideation.  No hallucinations  BP 124/77  Pulse 70  Temp(Src) 97.8 F (36.6 C) (Oral)  Resp 18  SpO2 100%  Physical Exam: General: Pt awake/alert/oriented x4 in no major acute distress Eyes: PERRL, normal EOM. Sclera nonicteric Neuro: CN II-XII intact w/o focal sensory/motor deficits. Lymph: No head/neck/groin lymphadenopathy Psych:  No  delerium/psychosis/paranoia HENT: Normocephalic, Mucus membranes moist.  No thrush Neck: Supple, No tracheal deviation Chest: No pain.  Good respiratory excursion. CV:  Pulses intact.  Regular rhythm Abdomen: Soft, Nondistended.  Nontender.  Supraumbilical mass consistent with partially incarcerated ventral hernia. 6 cm hernias. Ext:  SCDs BLE.  No significant edema.  No cyanosis Skin: No petechiae / purpurea.  No major sores Musculoskeletal: No severe joint pain.  Good ROM major joints   Results:   Labs: No results found for this or any previous visit (from the past 48 hour(s)).  Imaging / Studies: No results found.  Medications / Allergies: per chart  Antibiotics: Anti-infectives   Start     Dose/Rate Route Frequency Ordered Stop   12/09/12 0506  ceFAZolin (ANCEF) IVPB 2 g/50 mL premix     2 g 100 mL/hr over 30 Minutes Intravenous 60 min pre-op 12/09/12 0506        Assessment  Brandi Rivas  52 y.o. female  Day of Surgery  Procedure(s): LAPAROSCOPIC VENTRAL WALL HERNIA REPAIR INSERTION OF MESH  Problem List:  Principal Problem:   Ventral hernia - supraumbilical Active Problems:   Obesity (BMI 30-39.9)   Supraumbilical ventral hernia.  Plan:  Laparoscopic exploration and repair of hernia with mesh:  The anatomy & physiology of the abdominal wall was discussed.  The pathophysiology of hernias was discussed.  Natural history risks without surgery including progeressive enlargement, pain, incarceration & strangulation was discussed.   Contributors to complications such as smoking, obesity, diabetes, prior surgery, etc were discussed.   I feel the risks of no intervention will lead to serious problems that outweigh the operative risks; therefore, I recommended surgery to reduce and repair the hernia.  I explained laparoscopic techniques with possible need for an open approach.  I noted the probable use of mesh to patch and/or buttress the hernia repair  Risks  such as bleeding, infection, abscess, need for further treatment, heart attack, death, and other risks were discussed.  I noted a good likelihood this will help address the problem.   Goals of post-operative recovery were discussed as well.  Possibility that this will not correct all symptoms was explained.  I stressed the importance of low-impact activity, aggressive pain control, avoiding constipation, & not pushing through pain to minimize risk of post-operative chronic pain or injury. Possibility of reherniation especially with smoking, obesity, diabetes, immunosuppression, and other health conditions was discussed.  We will work to minimize complications.     An educational handout further explaining the pathology & treatment options was given as well.  Questions were answered.  The patient expresses understanding & wishes to proceed with surgery.     -VTE prophylaxis- SCDs, etc -mobilize as tolerated to help recovery    Ardeth Sportsman, M.D., F.A.C.S. Gastrointestinal and Minimally Invasive Surgery [facility] [address], [address] [place], [place] [address] [phone] Main / Paging   12/09/2012

## 2012-12-09 NOTE — Op Note (Signed)
12/09/2012  10:28 AM  PATIENT:  Brandi Rivas  52 y.o. female  Patient Care Team: Lupe Carney, MD as PCP - General (Family Medicine)  PRE-OPERATIVE DIAGNOSIS:  preiumbilical abdominal wall hernia(s)  POST-OPERATIVE DIAGNOSIS:  preiumbilical abdominal wall hernia(s) - incarcerated  PROCEDURE:  Procedure(s): LAPAROSCOPIC VENTRAL WALL HERNIA REPAIR INSERTION OF MESH  SURGEON:  Surgeon(s): Ardeth Sportsman, MD  ASSISTANT: none   ANESTHESIA:   local and general  EBL:  Total I/O In: 2800 [I.V.:2800] Out: 100 [Urine:100]  Delay start of Pharmacological VTE agent (>24hrs) due to surgical blood loss or risk of bleeding:  no  DRAINS: none   SPECIMEN:  No Specimen  DISPOSITION OF SPECIMEN:  N/A  COUNTS:  YES  PLAN OF CARE: Admit for overnight observation  PATIENT DISPOSITION:  PACU - hemodynamically stable.  INDICATION: Pleasant patient has developed a ventral wall abdominal hernia.   Recommendation was made for surgical repair:  The anatomy & physiology of the abdominal wall was discussed. The pathophysiology of hernias was discussed. Natural history risks without surgery including progeressive enlargement, pain, incarceration & strangulation was discussed. Contributors to complications such as smoking, obesity, diabetes, prior surgery, etc were discussed.  I feel the risks of no intervention will lead to serious problems that outweigh the operative risks; therefore, I recommended surgery to reduce and repair the hernia. I explained laparoscopic techniques with possible need for an open approach. I noted the probable use of mesh to patch and/or buttress the hernia repair  Risks such as bleeding, infection, abscess, need for further treatment, heart attack, death, and other risks were discussed. I noted a good likelihood this will help address the problem. Goals of post-operative recovery were discussed as well. Possibility that this will not correct all symptoms was explained.  I stressed the importance of low-impact activity, aggressive pain control, avoiding constipation, & not pushing through pain to minimize risk of post-operative chronic pain or injury. Possibility of reherniation especially with smoking, obesity, diabetes, immunosuppression, and other health conditions was discussed. We will work to minimize complications.  An educational handout further explaining the pathology & treatment options was given as well. Questions were answered. The patient expresses understanding & wishes to proceed with surgery.   OR FINDINGS: supraumbilical ventral hernia incarcerated with falciform ligament and some omentum. Also periumbilical incisional hernias. Total region 9 x 5 cm   Type of repair - Laparoscopic underlay repair   Name of mesh - Bard Ventralex dual sided (polypropylene / Seprafilm)  Size of mesh - Length 25 cm, Width 20 cm  Mesh overlap - 5-10 cm  Placement of mesh - Intraperitoneal underlay repair   DESCRIPTION:   Informed consent was confirmed. The patient underwent general anaesthesia without difficulty. The patient was positioned appropriately. VTE prevention in place. The patient's abdomen was clipped, prepped, & draped in a sterile fashion. Surgical timeout confirmed our plan.  The patient was positioned in reverse Trendelenburg. Abdominal entry was gained using optical entry technique in the left upper abdomen. Entry was clean. I induced carbon dioxide insufflation. Camera inspection revealed no injury. Extra ports were carefully placed under direct laparoscopic visualization.   I could see the hernia in the periumbilical abdomen.   I did laparoscopic lysis of adhesions to expose the entire anterior abdominal wall.  I primarily used Harmonic scalpel since it was already opened.   I reduced the falciform ligament, the suprabuccal ventral hernia. I freed falciform ligament off the anterior abdominal wall from the anterior liver to infraumbilical.  That  help reduce the fat and help map out the region of hernia defects.  I made sure hemostasis was good.  I mapped out the region using a needle passer.   To ensure that I would have at least 7 cm radial coverage outside of the hernia defect given her morbid obesity, I chose a 25x20 cm dual sided mesh.  I placed #1 Prolene stitches around its edge about every 5 cm = 18 total.  I rolled the mesh & placed into the peritoneal cavity through the supaumbilical largest defect.  I unrolled  the mesh and positioned it appropriately.  I secured the mesh to cover up the hernia defect using a laparoscopic suture passer to pass the tails of the Prolene through the abdominal wall & tagged them with clamps.  I started out in four corners to make sure I had the mesh centered over the hernia defect appropriately, and then proceeded to work in quadrants.  We evacuated CO2 & desufflated the abdomen.  I tied the fascial stitches down.  I reinsufflated the abdomen.  The mesh provided at least 5-10 cm circumferential coverage around the entire region of hernia defects.   I tacked the edges & central part of the mesh to the peritoneum/posterior rectus fascia with SecureStrap absorbable tacks.   Hemostasis was excellent.  I closed the fascia port site on the #1 PDS interrupted. I did diagnostic laparoscopy. A knuckle of jejunum was impinged in one of the stitches. I removed the stitch. There was no evidence of any serosal bowel injury. No leak.  I then placed On-Q catheter sheaths in the preperitoneal plane under direct laparoscopic guidance from the subxiphoid region down to the posterior iliac crests in the lower flanks. I did reinspection. Hemostasis was good. Mesh laid well. Capnoperitoneum was evacuated. Ports were removed. The skin was closed with Monocryl at the port sites and Steri-Strips on the fascial stitch puncture sites. OnQ catheters were placed and the sheathes peeled away. On-Q pump was secured. Patient is being extubated  to go to the recovery room. I'm about to discuss operative findings with the patient's friend.

## 2012-12-09 NOTE — Telephone Encounter (Signed)
Pt mad aware of financial responsibilities. Placed in pending.

## 2012-12-09 NOTE — Anesthesia Postprocedure Evaluation (Signed)
  Anesthesia Post-op Note  Patient: Brandi Rivas  Procedure(s) Performed: Procedure(s) (LRB): LAPAROSCOPIC VENTRAL WALL HERNIA REPAIR (N/A) INSERTION OF MESH (N/A)  Patient Location: PACU  Anesthesia Type: General  Level of Consciousness: awake and alert   Airway and Oxygen Therapy: Patient Spontanous Breathing  Post-op Pain: mild  Post-op Assessment: Post-op Vital signs reviewed, Patient's Cardiovascular Status Stable, Respiratory Function Stable, Patent Airway and No signs of Nausea or vomiting  Last Vitals:  Filed Vitals:   12/09/12 1145  BP: 109/71  Pulse: 73  Temp: 36.6 C  Resp:     Post-op Vital Signs: stable   Complications: No apparent anesthesia complications

## 2012-12-09 NOTE — Transfer of Care (Signed)
Immediate Anesthesia Transfer of Care Note  Patient: Brandi Rivas  Procedure(s) Performed: Procedure(s): LAPAROSCOPIC VENTRAL WALL HERNIA REPAIR (N/A) INSERTION OF MESH (N/A)  Patient Location: PACU  Anesthesia Type:General  Level of Consciousness: awake, sedated and patient cooperative  Airway & Oxygen Therapy: Patient Spontanous Breathing and Patient connected to face mask oxygen  Post-op Assessment: Report given to PACU RN and Post -op Vital signs reviewed and stable  Post vital signs: Reviewed and stable  Complications: No apparent anesthesia complications

## 2012-12-10 ENCOUNTER — Encounter (HOSPITAL_COMMUNITY): Payer: Self-pay | Admitting: Surgery

## 2012-12-10 DIAGNOSIS — I10 Essential (primary) hypertension: Secondary | ICD-10-CM | POA: Insufficient documentation

## 2012-12-10 DIAGNOSIS — R338 Other retention of urine: Secondary | ICD-10-CM

## 2012-12-10 HISTORY — DX: Other retention of urine: R33.8

## 2012-12-10 MED ORDER — NAPROXEN 500 MG PO TABS
500.0000 mg | ORAL_TABLET | Freq: Once | ORAL | Status: AC
  Administered 2012-12-10: 500 mg via ORAL
  Filled 2012-12-10: qty 1

## 2012-12-10 MED ORDER — NAPROXEN 500 MG PO TABS
500.0000 mg | ORAL_TABLET | Freq: Two times a day (BID) | ORAL | Status: DC
  Administered 2012-12-10 – 2012-12-11 (×2): 500 mg via ORAL
  Filled 2012-12-10 (×4): qty 1

## 2012-12-10 NOTE — Care Management Note (Signed)
    Page 1 of 1   12/10/2012     1:16:07 PM   CARE MANAGEMENT NOTE 12/10/2012  Patient:  Brandi Rivas, Brandi Rivas   Account Number:  [id number]  Date Initiated:  12/10/2012  Documentation initiated by:  Lorenda Ishihara  Subjective/Objective Assessment:   52 yo female admitted s/p lap ventral hernia repair.     Action/Plan:   Home when stable   Anticipated DC Date:  12/11/2012   Anticipated DC Plan:  HOME/SELF CARE      DC Planning Services  CM consult      Choice offered to / List presented to:             Status of service:  Completed, signed off Medicare Important Message given?   (If response is "NO", the following Medicare IM given date fields will be blank) Date Medicare IM given:   Date Additional Medicare IM given:    Discharge Disposition:  HOME/SELF CARE  Per UR Regulation:  Reviewed for med. necessity/level of care/duration of stay  If discussed at Long Length of Stay Meetings, dates discussed:    Comments:

## 2012-12-10 NOTE — Progress Notes (Signed)
Pt unable to void. Bladder scan showed 488. Inserted catheter and 500cc return immediately. Foley left in place per order.  Brandi Rivas

## 2012-12-10 NOTE — Progress Notes (Signed)
Brandi Rivas [id number] December 09, 1960  CARE TEAM:  PCP: Lupe Carney, MD  Outpatient Care Team: Patient Care Team: Lupe Carney, MD as PCP - General (Family Medicine)  Inpatient Treatment Team: Treatment Team: Attending Provider: Ardeth Sportsman, MD; Consulting Physician: Md Montez Morita, MD   Subjective:  Not able to void - Foley replaced Walked in hallways x 1 Nauseated last night - better this AM.  Pain controlled   Objective:  Vital signs:  Filed Vitals:   12/09/12 1445 12/09/12 1840 12/09/12 2135 12/10/12 0554  BP: 110/62 112/74 107/58 114/65  Pulse: 62 68 70 72  Temp: 98.6 F (37 C) 98.3 F (36.8 C) 97.6 F (36.4 C) 97.7 F (36.5 C)  TempSrc: Oral Oral Oral Oral  Resp: 18 16 18 18   Height:      Weight:      SpO2: 95% 95% 98% 96%    Last BM Date: 12/09/12  Intake/Output   Yesterday:  11/04 0701 - 11/05 0700 In: 3740 [P.O.:240; I.V.:3250; IV Piggyback:250] Out: 1000 [Urine:1000] This shift:     Bowel function:  Flatus: n  BM: n  Drain: n/a  Physical Exam:  General: Pt awake/alert/oriented x4 in no acute distress Eyes: PERRL, normal EOM.  Sclera clear.  No icterus Neuro: CN II-XII intact w/o focal sensory/motor deficits. Lymph: No head/neck/groin lymphadenopathy Psych:  No delerium/psychosis/paranoia HENT: Normocephalic, Mucus membranes moist.  No thrush Neck: Supple, No tracheal deviation Chest: No chest wall pain w good excursion CV:  Pulses intact.  Regular rhythm MS: Normal AROM mjr joints.  No obvious deformity Abdomen: Soft.  Nondistended.  Mildly tender at incisions only.  No evidence of peritonitis.  No incarcerated hernias.  OnQ in place Ext:  SCDs BLE.  No mjr edema.  No cyanosis Skin: No petechiae / purpura   Problem List:   Principal Problem:   Ventral hernia - periumbilical - s/p lap repair 12/09/2012 Active Problems:   Obesity (BMI 30-39.9)   Acute urinary retention   Assessment  Brandi Rivas  52 y.o. female  1 Day  Post-Op  Procedure(s): LAPAROSCOPIC VENTRAL WALL HERNIA REPAIR INSERTION OF MESH  FAir  Plan:  -adv diet -wean IVF -d/c Foley at [place] -VTE prophylaxis- SCDs, etc -mobilize as tolerated to help recovery  D/C patient from hospital when patient meets criteria (anticipate in 1-2 day(s)):  Tolerating oral intake well Ambulating in walkways Adequate pain control without IV medications Urinating  Having flatus   Ardeth Sportsman, M.D., F.A.C.S. Gastrointestinal and Minimally Invasive Surgery [facility] [address], [address] [place], [place] [address] [phone] Main / Paging   12/10/2012   Results:   Labs: Results for orders placed during the hospital encounter of 12/09/12 (from the past 48 hour(s))  CBC     Status: Abnormal   Collection Time    12/09/12 12:14 PM      Result Value Range   WBC 7.2  4.0 - 10.5 K/uL   RBC 3.51 (*) 3.87 - 5.11 MIL/uL   Hemoglobin 10.2 (*) 12.0 - 15.0 g/dL   HCT 72.5 (*) 36.6 - 44.0 %   MCV 90.9  78.0 - 100.0 fL   MCH 29.1  26.0 - 34.0 pg   MCHC 32.0  30.0 - 36.0 g/dL   RDW 34.7  42.5 - 95.6 %   Platelets 174  150 - 400 K/uL  CREATININE, SERUM     Status: None   Collection Time    12/09/12 12:14 PM  Result Value Range   Creatinine, Ser 0.71  0.50 - 1.10 mg/dL   GFR calc non Af Amer >90  >90 mL/min   GFR calc Af Amer >90  >90 mL/min   Comment: (NOTE)     The eGFR has been calculated using the CKD EPI equation.     This calculation has not been validated in all clinical situations.     eGFR's persistently <90 mL/min signify possible Chronic Kidney     Disease.    Imaging / Studies: No results found.  Medications / Allergies: per chart  Antibiotics: Anti-infectives   Start     Dose/Rate Route Frequency Ordered Stop   12/09/12 1600  metroNIDAZOLE (FLAGYL) IVPB 500 mg     500 mg 100 mL/hr over 60 Minutes Intravenous Every 8 hours 12/09/12 1154 12/10/12 0045   12/09/12 1400  ceFAZolin (ANCEF)  IVPB 2 g/50 mL premix     2 g 100 mL/hr over 30 Minutes Intravenous Every 6 hours 12/09/12 1154 12/10/12 0227   12/09/12 1015  metroNIDAZOLE (FLAGYL) IVPB 500 mg     500 mg 100 mL/hr over 60 Minutes Intravenous  Once 12/09/12 1009 12/09/12 1011   12/09/12 0506  ceFAZolin (ANCEF) IVPB 2 g/50 mL premix     2 g 100 mL/hr over 30 Minutes Intravenous 60 min pre-op 12/09/12 0506 12/09/12 0755

## 2012-12-11 NOTE — Progress Notes (Signed)
Discharge instructions reviewed with patient, questions and concerns answered, vital signs are stable, incisions are within normal limits, taught patient how to self discontinue for on-q pump as stated by MD gross, patient is tolerating her diet, up and ambulating, patient to follow up with MD within 3 weeks Stanford Breed RN 12-11-2012 10:52am

## 2012-12-11 NOTE — Discharge Summary (Signed)
Physician Discharge Summary  [name] MRN: [id number] DOB/AGE: 51/20/1962 51 y.o.  Admit date: 12/09/2012 Discharge date: 12/11/2012  Admission Diagnoses: Principal Problem:   Ventral hernia - periumbilical - s/p lap repair 12/09/2012 Active Problems:   Obesity (BMI 30-39.9)  Discharge Diagnoses:  Principal Problem:   Ventral hernia - periumbilical - s/p lap repair 12/09/2012 Active Problems:   Obesity (BMI 30-39.9)   Acute urinary retention   Discharged Condition: good  Hospital Course:  Patient with incarcerated ventral hernias. Underwent laparoscopic exploration. Found to have a large one supraumbilically and a few small ones periumbilically. This was repaired. Used underlay mesh. Patient did have urinary retention that required a Foley catheter replacement. However that was removed the night before discharge and she was voiding well by the time of discharge.  Postoperatively, the patient mobilized in the hallways and advanced to a solid diet gradually.  Pain was well-controlled and transitioned off IV medications.    By the time of discharge, the patient was walking well the hallways, eating food well, having flatus.  Pain was-controlled on an oral regimen.  Based on meeting DC criteria and recovering well, I felt it was safe for the patient to be discharged home with close followup.  Instructions were discussed in detail.  They are written as well.     Consults: None  Significant Diagnostic Studies:  Results for orders placed during the hospital encounter of 12/09/12 (from the past 72 hour(s))  CBC     Status: Abnormal   Collection Time    12/09/12 12:14 PM      Result Value Range   WBC 7.2  4.0 - 10.5 K/uL   RBC 3.51 (*) 3.87 - 5.11 MIL/uL   Hemoglobin 10.2 (*) 12.0 - 15.0 g/dL   HCT 40.9 (*) 81.1 - 91.4 %   MCV 90.9  78.0 - 100.0 fL   MCH 29.1  26.0 - 34.0 pg   MCHC 32.0  30.0 - 36.0 g/dL   RDW 78.2  95.6 - 21.3 %   Platelets 174  150 - 400 K/uL   CREATININE, SERUM     Status: None   Collection Time    12/09/12 12:14 PM      Result Value Range   Creatinine, Ser 0.71  0.50 - 1.10 mg/dL   GFR calc non Af Amer >90  >90 mL/min   GFR calc Af Amer >90  >90 mL/min   Comment: (NOTE)     The eGFR has been calculated using the CKD EPI equation.     This calculation has not been validated in all clinical situations.     eGFR's persistently <90 mL/min signify possible Chronic Kidney     Disease.     Treatments:   POST-OPERATIVE DIAGNOSIS: preiumbilical abdominal wall hernia(s) - incarcerated   PROCEDURE: Procedure(s):  LAPAROSCOPIC VENTRAL WALL HERNIA REPAIR  INSERTION OF MESH  SURGEON: Surgeon(s):  Ardeth Sportsman, MD  ASSISTANT: none    Discharge Exam: Blood pressure 156/73, pulse 83, temperature 98.2 F (36.8 C), temperature source Oral, resp. rate 18, height 5\' 7"  (1.702 m), weight 224 lb (101.606 kg), SpO2 91.00%.  General: Pt awake/alert/oriented x4 in no major acute distress Eyes: PERRL, normal EOM. Sclera nonicteric Neuro: CN II-XII intact w/o focal sensory/motor deficits. Lymph: No head/neck/groin lymphadenopathy Psych:  No delerium/psychosis/paranoia HENT: Normocephalic, Mucus membranes moist.  No thrush Neck: Supple, No tracheal deviation Chest: No pain.  Good respiratory excursion. CV:  Pulses intact.  Regular rhythm MS:  Normal AROM mjr joints.  No obvious deformity Abdomen: Soft, Nondistended.  Min tender at incisions only.  No incarcerated hernias.  OnQ inplace.  Abd binder in place Ext:  SCDs BLE.  No significant edema.  No cyanosis Skin: No petechiae / purpura   Disposition: 01-Home or Self Care  Discharge Orders   Future Appointments Provider Department Dept Phone   12/29/2012 4:30 PM Ardeth Sportsman, MD [facility], [place] [phone]   Future Orders Complete By Expires   Call MD for:  extreme fatigue  As directed    Call MD for:  hives  As directed    Call MD for:  persistant nausea and  vomiting  As directed    Call MD for:  redness, tenderness, or signs of infection (pain, swelling, redness, odor or green/yellow discharge around incision site)  As directed    Call MD for:  severe uncontrolled pain  As directed    Call MD for:  As directed    Comments:     Temperature > 101.65F   Diet - low sodium heart healthy  As directed    Discharge instructions  As directed    Comments:     Please see discharge instruction sheets.  Also refer to handout given an office.  Please call our office if you have any questions or concerns [phone]   Discharge wound care:  As directed    Comments:     If you have closed incisions, shower and bathe over these incisions with soap and water every day.  Remove all surgical dressings on postoperative day #3.  You do not need to replace dressings over the closed incisions unless you feel more comfortable with a Band-Aid covering it.   Please call our office [phone] if you have further questions.   Driving Restrictions  As directed    Comments:     No driving until off narcotics and can safely swerve away without pain during an emergency   Increase activity slowly  As directed    Comments:     Walk an hour a day.  Use 20-30 minute walks.  When you can walk 30 minutes without difficulty, increase to low impact/moderate activities such as biking, jogging, swimming, sexual activity..  Eventually can increase to unrestricted activity when not feeling pain.  If you feel pain: STOP!Marland Kitchen   Let pain protect you from overdoing it.  Use ice/heat/over-the-counter pain medications to help minimize his soreness.  Use pain prescriptions as needed to remain active.  It is better to take extra pain medications and be more active than to stay bedridden to avoid all pain medications.   Lifting restrictions  As directed    Comments:     Avoid heavy lifting initially.  Do not push through pain.  You have no specific weight limit.  Coughing and sneezing or four more  stressful to your incision than any lifting you will do. Pain will protect you from injury.  Therefore, avoid intense activity until off all narcotic pain medications.  Coughing and sneezing or four more stressful to your incision than any lifting he will do.   May shower / Bathe  As directed    May walk up steps  As directed    Sexual Activity Restrictions  As directed    Comments:     Sexual activity as tolerated.  Do not push through pain.  Pain will protect you from injury.   Walk with assistance  As directed  Comments:     Walk over an hour a day.  May use a walker/cane/companion to help with balance and stamina.       Medication List    STOP taking these medications       ibuprofen 200 MG tablet  Commonly known as:  ADVIL,MOTRIN      TAKE these medications       fluticasone 50 MCG/ACT nasal spray  Commonly known as:  FLONASE  Place 2 sprays into the nose daily as needed for rhinitis.     naproxen sodium 220 MG tablet  Commonly known as:  ANAPROX  Take 1-2 tablets (220-440 mg total) by mouth 2 (two) times daily with a meal.     oxyCODONE 5 MG immediate release tablet  Commonly known as:  Oxy IR/ROXICODONE  Take 1-2 tablets (5-10 mg total) by mouth every 4 (four) hours as needed.           Follow-up Information   Follow up with Shion Bluestein C., MD. Schedule an appointment as soon as possible for a visit in 3 weeks.   Specialty:  General Surgery   Contact information:   [address] [place] [address] [phone]       Follow up with Ardeth Sportsman., MD. Schedule an appointment as soon as possible for a visit in 3 weeks.   Specialty:  General Surgery   Contact information:   [address] [place] [address] [phone]       Signed: Ardeth Sportsman. 12/11/2012, 7:41 AM

## 2012-12-12 ENCOUNTER — Telehealth (INDEPENDENT_AMBULATORY_CARE_PROVIDER_SITE_OTHER): Payer: Self-pay

## 2012-12-12 NOTE — Telephone Encounter (Signed)
Pt called to review ON-Q pump catheter removal. Removal instructions from brochure reviewed with pt. Pt confirmed black tip is on both catheters upon removal. Pt will cover with clean dry dsg and call with any concerns.

## 2012-12-29 ENCOUNTER — Encounter (INDEPENDENT_AMBULATORY_CARE_PROVIDER_SITE_OTHER): Payer: Self-pay

## 2012-12-29 ENCOUNTER — Encounter (INDEPENDENT_AMBULATORY_CARE_PROVIDER_SITE_OTHER): Payer: Self-pay | Admitting: Surgery

## 2012-12-29 ENCOUNTER — Ambulatory Visit (INDEPENDENT_AMBULATORY_CARE_PROVIDER_SITE_OTHER): Payer: BC Managed Care – PPO | Admitting: Surgery

## 2012-12-29 VITALS — BP 118/76 | HR 96 | Resp 16 | Ht 66.0 in | Wt 206.8 lb

## 2012-12-29 DIAGNOSIS — K439 Ventral hernia without obstruction or gangrene: Secondary | ICD-10-CM

## 2012-12-29 DIAGNOSIS — K5909 Other constipation: Secondary | ICD-10-CM | POA: Insufficient documentation

## 2012-12-29 DIAGNOSIS — E669 Obesity, unspecified: Secondary | ICD-10-CM

## 2012-12-29 DIAGNOSIS — K5900 Constipation, unspecified: Secondary | ICD-10-CM

## 2012-12-29 MED ORDER — PROMETHAZINE HCL 12.5 MG PO TABS: 12.5000 mg | ORAL_TABLET | Freq: Four times a day (QID) | ORAL | Status: DC | PRN

## 2012-12-29 MED ORDER — NAPROXEN 500 MG PO TABS: 500.0000 mg | ORAL_TABLET | Freq: Two times a day (BID) | ORAL | Status: DC

## 2012-12-29 NOTE — Progress Notes (Signed)
Subjective:     [name], female   DOB: 1960/06/10, 52 y.o.   MRN: [id number]  HPI  Brandi Rivas  March 21, 1960 [id number]  Patient Care Team: Lupe Carney, MD as PCP - General (Family Medicine)  Procedure (Date: 12/09/2012):  POST-OPERATIVE DIAGNOSIS: periumbilical abdominal wall hernia(s) - incarcerated   PROCEDURE: Procedure(s):  LAPAROSCOPIC VENTRAL WALL HERNIA REPAIR  INSERTION OF MESH   OR FINDINGS: supraumbilical ventral hernia incarcerated with falciform ligament and some omentum. Also periumbilical incisional hernias. Total region 9 x 5 cm  Type of repair - Laparoscopic underlay repair  Name of mesh - Bard Ventralight dual sided (polypropylene / Seprafilm)  Size of mesh - Length 25 cm, Width 20 cm  Mesh overlap - 5-10 cm  Placement of mesh - Intraperitoneal underlay repair   SURGEON: Ardeth Sportsman, MD   This patient returns for surgical re-evaluation.  She has weaned herself off narcotics.  Got itching with the oxycodone.  Completed the Aleve.  Using the binder when she is walking.  Was moving her bowels well.  Has gone back to being constipated again.  Has felt nauseated at times.  No fevers or chills.  Energy level pretty good.  Wanting to get back to work.  Has not had a bowel movement in 4 days.  Tries Mag citrate when it gets bad.  Has not quite but not yet.  Patient Active Problem List   Diagnosis Date Noted  . Acute urinary retention 12/10/2012  . Hypertension   . Ventral hernia - periumbilical - s/p lap repair 12/09/2012 11/03/2012  . Obesity (BMI 30-39.9) 11/03/2012    Past Medical History  Diagnosis Date  . Hypertension     PREVIOUSLY TOOK MED - BP STABLE - TAKEN OFF MED  . Sinus congestion   . Arthritis   . Ventral hernia   . Pneumonia 01/09/2011    Past Surgical History  Procedure Laterality Date  . Arthroscopy knee w/ drilling    . Ventral hernia repair N/A 12/09/2012    Procedure: LAPAROSCOPIC VENTRAL WALL HERNIA REPAIR;   Surgeon: Ardeth Sportsman, MD;  Location: [facility];  Service: General;  Laterality: N/A;  . Insertion of mesh N/A 12/09/2012    Procedure: INSERTION OF MESH;  Surgeon: Ardeth Sportsman, MD;  Location: [facility];  Service: General;  Laterality: N/A;    History   Social History  . Marital Status: Single    Spouse Name: N/A    Number of Children: N/A  . Years of Education: N/A   Occupational History  . CASHIER     [employer]   Social History Main Topics  . Smoking status: Never Smoker   . Smokeless tobacco: Not on file  . Alcohol Use: No  . Drug Use: No  . Sexual Activity: Not on file   Other Topics Concern  . Not on file   Social History Narrative  . No narrative on file    History reviewed. No pertinent family history.  Current Outpatient Prescriptions  Medication Sig Dispense Refill  . fluticasone (FLONASE) 50 MCG/ACT nasal spray Place 2 sprays into the nose daily as needed for rhinitis.      . naproxen sodium (ANAPROX) 220 MG tablet Take 1-2 tablets (220-440 mg total) by mouth 2 (two) times daily with a meal.  30 tablet  2  . oxyCODONE (OXY IR/ROXICODONE) 5 MG immediate release tablet Take 1-2 tablets (5-10 mg total) by mouth every 4 (four) hours as needed.  50 tablet  0   No current facility-administered medications for this visit.     No Known Allergies  BP 118/76  Pulse 96  Resp 16  Ht 5\' 6"  (1.676 m)  Wt 206 lb 12.8 oz (93.804 kg)  BMI 33.39 kg/m2  LMP 11/30/2012  No results found.    Review of Systems  Constitutional: Negative for fever, chills, diaphoresis, appetite change and fatigue.  HENT: Negative for ear discharge, ear pain, sore throat and trouble swallowing.   Eyes: Negative for photophobia, discharge and visual disturbance.  Respiratory: Negative for cough, choking, chest tightness and shortness of breath.   Cardiovascular: Negative for chest pain and palpitations.  Gastrointestinal: Positive for nausea and constipation. Negative for vomiting,  diarrhea, abdominal distention, anal bleeding and rectal pain.  Endocrine: Negative for cold intolerance and heat intolerance.  Genitourinary: Negative for dysuria, frequency and difficulty urinating.  Musculoskeletal: Negative for gait problem, myalgias and neck pain.  Skin: Negative for color change, pallor and rash.  Allergic/Immunologic: Negative for environmental allergies, food allergies and immunocompromised state.  Neurological: Negative for dizziness, speech difficulty, weakness and numbness.  Hematological: Negative for adenopathy.  Psychiatric/Behavioral: Negative for confusion and agitation. The patient is not nervous/anxious.        Objective:   Physical Exam  Constitutional: She is oriented to person, place, and time. She appears well-developed and well-nourished. No distress.  HENT:  Head: Normocephalic.  Mouth/Throat: Oropharynx is clear and moist. No oropharyngeal exudate.  Eyes: Conjunctivae and EOM are normal. Pupils are equal, round, and reactive to light. No scleral icterus.  Neck: Normal range of motion. No tracheal deviation present.  Cardiovascular: Normal rate and intact distal pulses.   Pulmonary/Chest: Effort normal. No respiratory distress. She exhibits no tenderness.  Abdominal: Soft. She exhibits no distension. There is no tenderness. Hernia confirmed negative in the right inguinal area and confirmed negative in the left inguinal area.  Dressings were still on.  I removed all of the dressings and Steri-Strips.  Incisions clean with normal healing ridges.  No hernias.  Genitourinary: No vaginal discharge found.  Musculoskeletal: Normal range of motion. She exhibits no tenderness.  Lymphadenopathy:       Right: No inguinal adenopathy present.       Left: No inguinal adenopathy present.  Neurological: She is alert and oriented to person, place, and time. No cranial nerve deficit. She exhibits normal muscle tone. Coordination normal.  Skin: Skin is warm and  dry. No rash noted. She is not diaphoretic.  Psychiatric: She has a normal mood and affect. Her behavior is normal.       Assessment:     Recovered rather well 3 weeks  status post laparoscopic ventral wall hernia repair of periumbilical hernias.     Plan:     Increase activity as tolerated to regular activity.  Low impact exercise such as walking an hour a day at least ideal.  Do not push through pain.I renewed naproxen for now.  Held off on any narcotics.  She wishes to get back to work.  She works to [employer] only.  I think that is fine as long as she avoids heavy duty until next week per  Diet as tolerated.  Low fat high fiber diet ideal.  Bowel regimen with 30 g fiber a day and fiber supplement as needed to avoid problems.She needs to get on a fiber supplement to avoid these bouts of constipation.  She requested nausea medications.  I sent Phenergan  Return to  clinic as needed.   Instructions discussed.  Followup with primary care physician for other health issues as would normally be done.  Questions answered.  The patient expressed understanding and appreciation

## 2012-12-29 NOTE — Patient Instructions (Signed)
Managing Pain  Pain after surgery or related to activity is often due to strain/injury to muscle, tendon, nerves and/or incisions.  This pain is usually short-term and will improve in a few months.   Many people find it helpful to do the following things TOGETHER to help speed the process of healing and to get back to regular activity more quickly:  1. Avoid heavy physical activity a.  no lifting greater than 20 pounds b. Do not "push through" the pain.  Listen to your body and avoid positions and maneuvers than reproduce the pain c. Walking is okay as tolerated, but go slowly and stop when getting sore.  d. Remember: If it hurts to do it, then don't do it! 2. Take Anti-inflammatory medication  a. Take with food/snack around the clock for 1-2 weeks i. This helps the muscle and nerve tissues become less irritable and calm down faster b. Choose ONE of the following over-the-counter medications: i. Naproxen 220mg tabs (ex. Aleve) 1-2 pills twice a day  ii. Ibuprofen 200mg tabs (ex. Advil, Motrin) 3-4 pills with every meal and just before bedtime iii. Acetaminophen 500mg tabs (Tylenol) 1-2 pills with every meal and just before bedtime 3. Use a Heating pad or Ice/Cold Pack a. 4-6 times a day b. May use warm bath/hottub  or showers 4. Try Gentle Massage and/or Stretching  a. at the area of pain many times a day b. stop if you feel pain - do not overdo it  Try these steps together to help you body heal faster and avoid making things get worse.  Doing just one of these things may not be enough.    If you are not getting better after two weeks or are noticing you are getting worse, contact our office for further advice; we may need to re-evaluate you & see what other things we can do to help.  HERNIA REPAIR: POST OP INSTRUCTIONS  1. DIET: Follow a light bland diet the first 24 hours after arrival home, such as soup, liquids, crackers, etc.  Be sure to include lots of fluids daily.  Avoid fast  food or heavy meals as your are more likely to get nauseated.  Eat a low fat the next few days after surgery. 2. Take your usually prescribed home medications unless otherwise directed. 3. PAIN CONTROL: a. Pain is best controlled by a usual combination of three different methods TOGETHER: i. Ice/Heat ii. Over the counter pain medication iii. Prescription pain medication b. Most patients will experience some swelling and bruising around the hernia(s) such as the bellybutton, groins, or old incisions.  Ice packs or heating pads (30-60 minutes up to 6 times a day) will help. Use ice for the first few days to help decrease swelling and bruising, then switch to heat to help relax tight/sore spots and speed recovery.  Some people prefer to use ice alone, heat alone, alternating between ice & heat.  Experiment to what works for you.  Swelling and bruising can take several weeks to resolve.   c. It is helpful to take an over-the-counter pain medication regularly for the first few weeks.  Choose one of the following that works best for you: i. Naproxen (Aleve, etc)  Two 220mg tabs twice a day ii. Ibuprofen (Advil, etc) Three 200mg tabs four times a day (every meal & bedtime) iii. Acetaminophen (Tylenol, etc) 325-650mg four times a day (every meal & bedtime) d. A  prescription for pain medication should be given to you upon discharge.    Take your pain medication as prescribed.  i. If you are having problems/concerns with the prescription medicine (does not control pain, nausea, vomiting, rash, itching, etc), please call us [phone] to see if we need to switch you to a different pain medicine that will work better for you and/or control your side effect better. ii. If you need a refill on your pain medication, please contact your pharmacy.  They will contact our office to request authorization. Prescriptions will not be filled after 5 pm or on week-ends. 4. Avoid getting constipated.  Between the surgery  and the pain medications, it is common to experience some constipation.  Increasing fluid intake and taking a fiber supplement (such as Metamucil, Citrucel, FiberCon, MiraLax, etc) 1-2 times a day regularly will usually help prevent this problem from occurring.  A mild laxative (prune juice, Milk of Magnesia, MiraLax, etc) should be taken according to package directions if there are no bowel movements after 48 hours.   5. Wash / shower every day.  You may shower over the dressings as they are waterproof.   6. Remove your waterproof bandages 5 days after surgery.  You may leave the incision open to air.  You may replace a dressing/Band-Aid to cover the incision for comfort if you wish.  Continue to shower over incision(s) after the dressing is off.    7. ACTIVITIES as tolerated:   a. You may resume regular (light) daily activities beginning the next day-such as daily self-care, walking, climbing stairs-gradually increasing activities as tolerated.  If you can walk 30 minutes without difficulty, it is safe to try more intense activity such as jogging, treadmill, bicycling, low-impact aerobics, swimming, etc. b. Save the most intensive and strenuous activity for last such as sit-ups, heavy lifting, contact sports, etc  Refrain from any heavy lifting or straining until you are off narcotics for pain control.   c. DO NOT PUSH THROUGH PAIN.  Let pain be your guide: If it hurts to do something, don't do it.  Pain is your body warning you to avoid that activity for another week until the pain goes down. d. You may drive when you are no longer taking prescription pain medication, you can comfortably wear a seatbelt, and you can safely maneuver your car and apply brakes. e. You may have sexual intercourse when it is comfortable.  8. FOLLOW UP in our office a. Please call [facility] at [phone] to set up an appointment to see your surgeon in the office for a follow-up appointment approximately 2-3 weeks after your  surgery. b. Make sure that you call for this appointment the day you arrive home to insure a convenient appointment time. 9.  IF YOU HAVE DISABILITY OR FAMILY LEAVE FORMS, BRING THEM TO THE OFFICE FOR PROCESSING.  DO NOT GIVE THEM TO YOUR DOCTOR.  WHEN TO CALL US [phone]: 1. Poor pain control 2. Reactions / problems with new medications (rash/itching, nausea, etc)  3. Fever over 101.5 F (38.5 C) 4. Inability to urinate 5. Nausea and/or vomiting 6. Worsening swelling or bruising 7. Continued bleeding from incision. 8. Increased pain, redness, or drainage from the incision   The clinic staff is available to answer your questions during regular business hours (8:30am-5pm).  Please don't hesitate to call and ask to speak to one of our nurses for clinical concerns.   If you have a medical emergency, go to the nearest emergency room or call 911.  A surgeon from [facility] is   always on call at the hospitals in [place]  [facility] [address], [address], [place], [place]  [address] ?  [address] [address], [place], [place]   [address] MAIN: [phone] ? TOLL FREE: [phone] ? FAX: [phone] [email or web address]   

## 2013-04-17 ENCOUNTER — Emergency Department (HOSPITAL_COMMUNITY): Payer: BC Managed Care – PPO

## 2013-04-17 ENCOUNTER — Encounter (HOSPITAL_COMMUNITY): Payer: Self-pay | Admitting: Emergency Medicine

## 2013-04-17 ENCOUNTER — Inpatient Hospital Stay (HOSPITAL_COMMUNITY)
Admission: EM | Admit: 2013-04-17 | Discharge: 2013-04-20 | DRG: 176 | Disposition: A | Discharge status: Home / Self Care | Payer: BC Managed Care – PPO | Attending: Internal Medicine | Admitting: Internal Medicine

## 2013-04-17 ENCOUNTER — Emergency Department (HOSPITAL_COMMUNITY)
Admission: EM | Admit: 2013-04-17 | Discharge: 2013-04-17 | Disposition: A | Discharge status: Nursing Home (Includes ICF) | Payer: BC Managed Care – PPO | Source: Home / Self Care

## 2013-04-17 DIAGNOSIS — I2699 Other pulmonary embolism without acute cor pulmonale: Principal | ICD-10-CM

## 2013-04-17 DIAGNOSIS — R791 Abnormal coagulation profile: Secondary | ICD-10-CM

## 2013-04-17 DIAGNOSIS — R600 Localized edema: Secondary | ICD-10-CM

## 2013-04-17 DIAGNOSIS — Z8249 Family history of ischemic heart disease and other diseases of the circulatory system: Secondary | ICD-10-CM

## 2013-04-17 DIAGNOSIS — I10 Essential (primary) hypertension: Secondary | ICD-10-CM

## 2013-04-17 DIAGNOSIS — M79609 Pain in unspecified limb: Secondary | ICD-10-CM

## 2013-04-17 DIAGNOSIS — Z86711 Personal history of pulmonary embolism: Secondary | ICD-10-CM | POA: Diagnosis present

## 2013-04-17 DIAGNOSIS — J069 Acute upper respiratory infection, unspecified: Secondary | ICD-10-CM

## 2013-04-17 DIAGNOSIS — K5909 Other constipation: Secondary | ICD-10-CM

## 2013-04-17 DIAGNOSIS — K439 Ventral hernia without obstruction or gangrene: Secondary | ICD-10-CM

## 2013-04-17 DIAGNOSIS — I82819 Embolism and thrombosis of superficial veins of unspecified lower extremities: Secondary | ICD-10-CM | POA: Diagnosis present

## 2013-04-17 DIAGNOSIS — Z833 Family history of diabetes mellitus: Secondary | ICD-10-CM

## 2013-04-17 DIAGNOSIS — E669 Obesity, unspecified: Secondary | ICD-10-CM

## 2013-04-17 DIAGNOSIS — M79604 Pain in right leg: Secondary | ICD-10-CM

## 2013-04-17 DIAGNOSIS — R7989 Other specified abnormal findings of blood chemistry: Secondary | ICD-10-CM

## 2013-04-17 DIAGNOSIS — R609 Edema, unspecified: Secondary | ICD-10-CM

## 2013-04-17 LAB — CBC WITH DIFFERENTIAL/PLATELET
Basophils Absolute: 0.1 10*3/uL (ref 0.0–0.1)
Basophils Relative: 2 % — ABNORMAL HIGH (ref 0–1)
Eosinophils Absolute: 0.3 10*3/uL (ref 0.0–0.7)
Eosinophils Relative: 6 % — ABNORMAL HIGH (ref 0–5)
HCT: 35.6 % — ABNORMAL LOW (ref 36.0–46.0)
Hemoglobin: 11.3 g/dL — ABNORMAL LOW (ref 12.0–15.0)
Lymphocytes Relative: 42 % (ref 12–46)
Lymphs Abs: 2.1 10*3/uL (ref 0.7–4.0)
MCH: 28.8 pg (ref 26.0–34.0)
MCHC: 31.7 g/dL (ref 30.0–36.0)
MCV: 90.8 fL (ref 78.0–100.0)
Monocytes Absolute: 0.3 10*3/uL (ref 0.1–1.0)
Monocytes Relative: 6 % (ref 3–12)
Neutro Abs: 2.2 10*3/uL (ref 1.7–7.7)
Neutrophils Relative %: 45 % (ref 43–77)
Platelets: 237 10*3/uL (ref 150–400)
RBC: 3.92 MIL/uL (ref 3.87–5.11)
RDW: 13.7 % (ref 11.5–15.5)
WBC: 4.9 10*3/uL (ref 4.0–10.5)

## 2013-04-17 LAB — D-DIMER, QUANTITATIVE: D-Dimer, Quant: 1.75 ug/mL-FEU — ABNORMAL HIGH (ref 0.00–0.48)

## 2013-04-17 LAB — BASIC METABOLIC PANEL
BUN: 16 mg/dL (ref 6–23)
CO2: 26 mEq/L (ref 19–32)
Calcium: 8.9 mg/dL (ref 8.4–10.5)
Chloride: 103 mEq/L (ref 96–112)
Creatinine, Ser: 0.66 mg/dL (ref 0.50–1.10)
GFR calc Af Amer: >90 mL/min (ref >90)
GFR calc non Af Amer: >90 mL/min (ref >90)
Glucose, Bld: 94 mg/dL (ref 70–99)
Potassium: 4.2 mEq/L (ref 3.7–5.3)
Sodium: 139 mEq/L (ref 137–147)

## 2013-04-17 LAB — I-STAT TROPONIN, ED: Troponin i, poc: 0 ng/mL (ref 0.00–0.08)

## 2013-04-17 MED ORDER — HYDROCODONE-ACETAMINOPHEN 5-325 MG PO TABS
1.0000 | ORAL_TABLET | Freq: Once | ORAL | Status: AC
Start: 2013-04-17 — End: 2013-04-17
  Administered 2013-04-17: 1 via ORAL
  Filled 2013-04-17: qty 1

## 2013-04-17 MED ORDER — HEPARIN BOLUS VIA INFUSION
5000.0000 [IU] | Freq: Once | INTRAVENOUS | Status: AC
  Administered 2013-04-17: 5000 [IU] via INTRAVENOUS
  Filled 2013-04-17: qty 5000

## 2013-04-17 MED ORDER — IOHEXOL 350 MG/ML SOLN
100.0000 mL | Freq: Once | INTRAVENOUS | Status: AC | PRN
  Administered 2013-04-17: 100 mL via INTRAVENOUS

## 2013-04-17 MED ORDER — HEPARIN (PORCINE) IN NACL 100-0.45 UNIT/ML-% IJ SOLN
1300.0000 [IU]/h | INTRAMUSCULAR | Status: DC
  Administered 2013-04-17: 1300 [IU]/h via INTRAVENOUS
  Filled 2013-04-17 (×2): qty 250

## 2013-04-17 NOTE — ED Notes (Addendum)
Pt. transferred from [facility] this evening reports right leg pain since Sunday last week , denies injury or fall , sent here for furher evaluation , pain worse when walking and standing , pt. stated  Working at [employer] standing long periods during the day . Respirations unlabored. CBC with diff and elevated D- dimer done at urgent care this evening .

## 2013-04-17 NOTE — ED Notes (Signed)
Pain and swelling in leg since last Saturday uri

## 2013-04-17 NOTE — H&P (Signed)
PCP:  [name],[name], MD    Chief Complaint:  Chest leg swelling  HPI: Brandi Rivas is a 53 y.o. female   has a past medical history of Hypertension; Sinus congestion; Arthritis; Ventral hernia; Pneumonia (01/09/2011); and Acute urinary retention (12/10/2012).   Presented with  1 week hx of right leg swelling and redness. Patient was evaluated today for this at urgent care and was sent to ER where she had A CTA done and was found to have PE. Patient has some associated shortness of breath and palpitations. No recent travel, patient states she is not sedentary. She works as a Conservation officer, naturecashier. Her son had a DVT after getting hit by a motorcycle. She have had negative mamograms done never had a colonoscopy. States her weight is fluctuating.   Hospitalist was called for admission. Patient states that shed history of hypertension but currently not taking anything for it and her pressure has been doing Better. Review of Systems:     Pertinent positives include: leg swelling,  Palpitations,  shortness of breath at rest.   Constitutional:  No weight loss, night sweats, Fevers, chills, fatigue, weight loss  HEENT:  No headaches, Difficulty swallowing,Tooth/dental problems,Sore throat,  No sneezing, itching, ear ache, nasal congestion, post nasal drip,  Cardio-vascular:  No chest pain, Orthopnea, PND, anasarca, dizziness, GI:  No heartburn, indigestion, abdominal pain, nausea, vomiting, diarrhea, change in bowel habits, loss of appetite, melena, blood in stool, hematemesis Resp:  noNo dyspnea on exertion, No excess mucus, no productive cough, No non-productive cough, No coughing up of blood.No change in color of mucus.No wheezing. Skin:  no rash or lesions. No jaundice GU:  no dysuria, change in color of urine, no urgency or frequency. No straining to urinate.  No flank pain.  Musculoskeletal:  No joint pain or no joint swelling. No decreased range of motion. No back pain.  Psych:  No change in  mood or affect. No depression or anxiety. No memory loss.  Neuro: no localizing neurological complaints, no tingling, no weakness, no double vision, no gait abnormality, no slurred speech, no confusion  Otherwise ROS are negative except for above, 10 systems were reviewed  Past Medical History: Past Medical History  Diagnosis Date  . Hypertension     PREVIOUSLY TOOK MED - BP STABLE - TAKEN OFF MED  . Sinus congestion   . Arthritis   . Ventral hernia   . Pneumonia 01/09/2011  . Acute urinary retention 12/10/2012   Past Surgical History  Procedure Laterality Date  . Arthroscopy knee w/ drilling    . Ventral hernia repair N/A 12/09/2012    Procedure: LAPAROSCOPIC VENTRAL WALL HERNIA REPAIR;  Surgeon: Ardeth SportsmanSteven C. Gross, MD;  Location: [facility];  Service: General;  Laterality: N/A;  . Insertion of mesh N/A 12/09/2012    Procedure: INSERTION OF MESH;  Surgeon: Ardeth SportsmanSteven C. Gross, MD;  Location: [facility];  Service: General;  Laterality: N/A;     Medications: Prior to Admission medications   Medication Sig Start Date End Date Taking? Authorizing Provider  fluticasone (FLONASE) 50 MCG/ACT nasal spray Place 2 sprays into the nose daily as needed for rhinitis.   Yes Historical Provider, MD  pseudoephedrine-acetaminophen (TYLENOL SINUS) 30-500 MG TABS Take 2 tablets by mouth every 4 (four) hours as needed (cold).   Yes Historical Provider, MD    Allergies:  No Known Allergies  Social History:  Ambulatory with a cane Lives at  home   reports that she has never smoked. She does not  have any smokeless tobacco history on file. She reports that she does not drink alcohol or use illicit drugs.   Family History: family history includes Diabetes type II in her mother; Hypertension in her father and mother.    Physical Exam: Patient Vitals for the past 24 hrs:  BP Temp Temp src Pulse Resp SpO2 Height Weight  04/17/13 2300 130/84 mmHg - - 85 21 97 % - -  04/17/13 2245 120/81 mmHg - - 80 22 100 % - -   04/17/13 2242 129/75 mmHg - - 78 18 98 % - -  04/17/13 1919 130/92 mmHg 98.9 F (37.2 C) Oral 82 14 97 % 5' 6.5" (1.689 m) 99.791 kg (220 lb)    1. General:  in No Acute distress 2. Psychological: Alert and  Oriented 3. Head/ENT:   Moist  Mucous Membranes                          Head Non traumatic, neck supple                          Normal  Dentition 4. SKIN: normal Skin turgor,  Skin clean Dry and intact no rash 5. Heart: Regular rate and rhythm no Murmur, Rub or gallop 6. Lungs: Clear to auscultation bilaterally, no wheezes or crackles   7. Abdomen: Soft, non-tender, Non distended 8. Lower extremities: no clubbing, cyanosis,  Right leg edema and warmth 9. Neurologically Grossly intact, moving all 4 extremities equally 10. MSK: Normal range of motion  body mass index is 34.98 kg/(m^2).   Labs on Admission:   Recent Labs  04/17/13 2112  NA 139  K 4.2  CL 103  CO2 26  GLUCOSE 94  BUN 16  CREATININE 0.66  CALCIUM 8.9   No results found for this basename: AST, ALT, ALKPHOS, BILITOT, PROT, ALBUMIN,  in the last 72 hours No results found for this basename: LIPASE, AMYLASE,  in the last 72 hours  Recent Labs  04/17/13 1814  WBC 4.9  NEUTROABS 2.2  HGB 11.3*  HCT 35.6*  MCV 90.8  PLT 237   No results found for this basename: CKTOTAL, CKMB, CKMBINDEX, TROPONINI,  in the last 72 hours No results found for this basename: TSH, T4TOTAL, FREET3, T3FREE, THYROIDAB,  in the last 72 hours No results found for this basename: VITAMINB12, FOLATE, FERRITIN, TIBC, IRON, RETICCTPCT,  in the last 72 hours No results found for this basename: HGBA1C    Estimated Creatinine Clearance: 100.1 ml/min (by C-G formula based on Cr of 0.66). ABG No results found for this basename: phart, pco2, po2, hco3, tco2, acidbasedef, o2sat     Lab Results  Component Value Date   DDIMER 1.75* 04/17/2013     Other results:  I have pearsonaly reviewed this: ECG REPORT  Rate: 66  Rhythm:  NSR ST&T Change: no ischemia     Cultures:    Component Value Date/Time   SDES URINE, RANDOM 01/10/2011 0708   SPECREQUEST NONE 01/10/2011 0708   CULT NO GROWTH 01/10/2011 0708   REPTSTATUS 01/11/2011 FINAL 01/10/2011 0708       Radiological Exams on Admission: Ct Angio Chest Pe W/cm &/or Wo Cm  04/17/2013   CLINICAL DATA:  Elevated D-dimer.  Short of breath  EXAM: CT ANGIOGRAPHY CHEST WITH CONTRAST  TECHNIQUE: Multidetector CT imaging of the chest was performed using the standard protocol during bolus administration of intravenous contrast.  Multiplanar CT image reconstructions and MIPs were obtained to evaluate the vascular anatomy.  CONTRAST:  OMNIPAQUE IOHEXOL 350 MG/ML SOLN  COMPARISON:  CT chest 03/03/2010,  FINDINGS: Filling defects in the right lower lobe pulmonary artery compatible with pulmonary embolism. No other emboli are identified. Mild clot burden. Negative for right heart strain.  Heart is enlarged.  No pericardial effusion.  The lungs are clear. Negative for infiltrate or effusion. Negative for mass or adenopathy. Upper abdomen is negative.  Review of the MIP images confirms the above findings.  IMPRESSION: Right lower lobe pulmonary embolism.  Mild clot burden.  Critical Value/emergent results were called by telephone at the time of interpretation on 04/17/2013 at 10:44 PM to Dr. Pricilla Loveless , who verbally acknowledged these results.   Electronically Signed   By: Marlan Palau M.D.   On: 04/17/2013 22:45    Chart has been reviewed  Assessment/Plan  53 year old female history of hypertension in the past presents with right leg swelling he was found to have PE on CT  Present on Admission:  . Pulmonary embolus - will admit to telemetry obtain echo gram divided for right heart strain. Heparin and Coumadin. Hypercoagulable panel. Likely would benefit from oncological workup as an outpatient. Patient never had a colonoscopy with her brother had history of colon cancer.  .  Hypertension  - currently well controlled  . right leg swelling most likely DVT to obtain Dopplers patient is currently already on anticoagulation   Prophylaxis: heparin  CODE STATUS: FULL CODE  Other plan as per orders.  I have spent a total of 55 min on this admission  Noelani Harbach 04/17/2013, 11:50 PM

## 2013-04-17 NOTE — ED Provider Notes (Signed)
CSN: [id number]     Arrival date & time 04/17/13  1907 History   First MD Initiated Contact with Patient 04/17/13 2042     Chief Complaint  Patient presents with  . Leg Pain     (Consider location/radiation/quality/duration/timing/severity/associated sxs/prior Treatment) HPI Comments: 53 year old female presents with one week of right lower strandy pain and swelling. She's noticed some medial erythema as well. Intermittently she's had shortness of breath and chest pain although she is a poor historian describing how and when this occurs. She stands on her feet all day at [employer]. She went to urgent care today because the pain has been worsening. The urgent care sent a CBC and a d-dimer. The d-dimer was elevated to 1.75 and her WBC was normal. She was sent here to rule out DVT. Patient denies current chest pain or shortness of breath.   Past Medical History  Diagnosis Date  . Hypertension     PREVIOUSLY TOOK MED - BP STABLE - TAKEN OFF MED  . Sinus congestion   . Arthritis   . Ventral hernia   . Pneumonia 01/09/2011  . Acute urinary retention 12/10/2012   Past Surgical History  Procedure Laterality Date  . Arthroscopy knee w/ drilling    . Ventral hernia repair N/A 12/09/2012    Procedure: LAPAROSCOPIC VENTRAL WALL HERNIA REPAIR;  Surgeon: Ardeth SportsmanSteven C. Gross, MD;  Location: [facility];  Service: General;  Laterality: N/A;  . Insertion of mesh N/A 12/09/2012    Procedure: INSERTION OF MESH;  Surgeon: Ardeth SportsmanSteven C. Gross, MD;  Location: [facility];  Service: General;  Laterality: N/A;   No family history on file. History  Substance Use Topics  . Smoking status: Never Smoker   . Smokeless tobacco: Not on file  . Alcohol Use: No   OB History   Grav Para Term Preterm Abortions TAB SAB Ect Mult Living                 Review of Systems  Constitutional: Negative for fever and chills.  HENT: Positive for congestion.   Respiratory: Positive for cough and shortness of breath.   Cardiovascular:  Positive for chest pain and leg swelling.  Gastrointestinal: Negative for vomiting and abdominal pain.  All other systems reviewed and are negative.      Allergies  Review of patient's allergies indicates no known allergies.  Home Medications   Current Outpatient Rx  Name  Route  Sig  Dispense  Refill  . fluticasone (FLONASE) 50 MCG/ACT nasal spray   Nasal   Place 2 sprays into the nose daily as needed for rhinitis.         . pseudoephedrine-acetaminophen (TYLENOL SINUS) 30-500 MG TABS   Oral   Take 2 tablets by mouth every 4 (four) hours as needed (cold).          BP 130/92  Pulse 82  Temp(Src) 98.9 F (37.2 C) (Oral)  Resp 14  Ht 5' 6.5" (1.689 m)  Wt 220 lb (99.791 kg)  BMI 34.98 kg/m2  SpO2 97%  LMP 04/17/2013 Physical Exam  Nursing note and vitals reviewed. Constitutional: She is oriented to person, place, and time. She appears well-developed and well-nourished. No distress.  HENT:  Head: Normocephalic and atraumatic.  Right Ear: External ear normal.  Left Ear: External ear normal.  Nose: Nose normal.  Eyes: Right eye exhibits no discharge. Left eye exhibits no discharge.  Cardiovascular: Normal rate, regular rhythm and normal heart sounds.   Pulmonary/Chest: Effort normal and  breath sounds normal. She has no wheezes. She has no rales.  Abdominal: Soft. There is no tenderness.  Musculoskeletal:  Erythema to medial aspect of the proximal lower leg and distal right upper leg. There is tenderness along this as well. No palpable cords. Right leg appears more swollen than left.  Neurological: She is alert and oriented to person, place, and time.  Skin: Skin is warm and dry.    ED Course  Procedures (including critical care time) Labs Review Labs Reviewed  BASIC METABOLIC PANEL  HEPARIN LEVEL (UNFRACTIONATED)  CBC  I-STAT TROPOININ, ED   Imaging Review Ct Angio Chest Pe W/cm &/or Wo Cm  04/17/2013   CLINICAL DATA:  Elevated D-dimer.  Short of breath   EXAM: CT ANGIOGRAPHY CHEST WITH CONTRAST  TECHNIQUE: Multidetector CT imaging of the chest was performed using the standard protocol during bolus administration of intravenous contrast. Multiplanar CT image reconstructions and MIPs were obtained to evaluate the vascular anatomy.  CONTRAST:  OMNIPAQUE IOHEXOL 350 MG/ML SOLN  COMPARISON:  CT chest 03/03/2010,  FINDINGS: Filling defects in the right lower lobe pulmonary artery compatible with pulmonary embolism. No other emboli are identified. Mild clot burden. Negative for right heart strain.  Heart is enlarged.  No pericardial effusion.  The lungs are clear. Negative for infiltrate or effusion. Negative for mass or adenopathy. Upper abdomen is negative.  Review of the MIP images confirms the above findings.  IMPRESSION: Right lower lobe pulmonary embolism.  Mild clot burden.  Critical Value/emergent results were called by telephone at the time of interpretation on 04/17/2013 at 10:44 PM to Dr. Pricilla Loveless , who verbally acknowledged these results.   Electronically Signed   By: Marlan Palau M.D.   On: 04/17/2013 22:45     EKG Interpretation   Date/Time:  Friday April 17 2013 21:17:39 EDT Ventricular Rate:  66 PR Interval:  136 QRS Duration: 95 QT Interval:  395 QTC Calculation: 414 R Axis:   68 Text Interpretation:  Sinus rhythm Normal ECG No significant change since  last tracing Confirmed by Morelia Cassells  MD, Klinton Candelas ([id number]) on 04/17/2013 10:31:35  PM      MDM   Final diagnoses:  Pulmonary embolus    Due to patient's history shortness of breath recently a CT scan was obtained. Shows a right lower lobe pulmonary embolus. No signs of infarction or right heart strain. No hypoxia. Given this is a new diagnosis, patient will need anticoagulation and further workup. Stable for the floor at this time. Admitted to the hospitalist.    Audree Camel, MD 04/17/13 [phone]

## 2013-04-17 NOTE — Progress Notes (Addendum)
ANTICOAGULATION CONSULT NOTE - Initial Consult  Pharmacy Consult for heparin and warfarin Indication: pulmonary embolus  No Known Allergies  Patient Measurements: Height: 5' 6.5" (168.9 cm) Weight: 220 lb (99.791 kg) IBW/kg (Calculated) : 60.45 Heparin Dosing Weight: 85kg  Vital Signs: Temp: 98.9 F (37.2 C) (03/13 1919) Temp src: Oral (03/13 1919) BP: 120/81 mmHg (03/13 2245) Pulse Rate: 80 (03/13 2245)  Labs:  Recent Labs  04/17/13 1814 04/17/13 2112  HGB 11.3*  --   HCT 35.6*  --   PLT 237  --   CREATININE  --  0.66    Estimated Creatinine Clearance: 100.1 ml/min (by C-G formula based on Cr of 0.66).   Medical History: Past Medical History  Diagnosis Date  . Hypertension     PREVIOUSLY TOOK MED - BP STABLE - TAKEN OFF MED  . Sinus congestion   . Arthritis   . Ventral hernia   . Pneumonia 01/09/2011  . Acute urinary retention 12/10/2012     [name] c/o pain and swelling in leg since last Saturday, D-dimer elevated, CT reveals RLL PE w/ mild clot burden, to begin anticoagulation.  Goal of Therapy:  Heparin level 0.3-0.7 units/ml Monitor platelets by anticoagulation protocol: Yes   Plan:  Will give heparin 5000 units IV bolus x1 followed by gtt at 1300 units/hr and monitor heparin levels and CBC.  Will give Coumadin 10mg  po x1 today and monitor INR for dose adjustments; will begin Coumadin education.  Brandi GamblesVeronda Ronnette Rivas, PharmD, BCPS  04/17/2013,10:53 PM

## 2013-04-17 NOTE — ED Provider Notes (Signed)
CSN: [id number]     Arrival date & time 04/17/13  1602 History   First MD Initiated Contact with Patient 04/17/13 1709     Chief Complaint  Patient presents with  . Leg Pain  . URI   (Consider location/radiation/quality/duration/timing/severity/associated sxs/prior Treatment) HPI Comments: 53 year old female is complaining of upper respiratory congestion and sinus congestion. This is associated with a runny nose. She has had these symptoms for "several days".  Second complaint is that of pain along the medial aspect of the right leg from the upper thigh to the lower leg. She states the leg is tender and swollen. She works at [employer] and stands for several hours at a time. The pain is worse with walking and standing.   Past Medical History  Diagnosis Date  . Hypertension     PREVIOUSLY TOOK MED - BP STABLE - TAKEN OFF MED  . Sinus congestion   . Arthritis   . Ventral hernia   . Pneumonia 01/09/2011  . Acute urinary retention 12/10/2012   Past Surgical History  Procedure Laterality Date  . Arthroscopy knee w/ drilling    . Ventral hernia repair N/A 12/09/2012    Procedure: LAPAROSCOPIC VENTRAL WALL HERNIA REPAIR;  Surgeon: Ardeth Sportsman, MD;  Location: [facility];  Service: General;  Laterality: N/A;  . Insertion of mesh N/A 12/09/2012    Procedure: INSERTION OF MESH;  Surgeon: Ardeth Sportsman, MD;  Location: [facility];  Service: General;  Laterality: N/A;   No family history on file. History  Substance Use Topics  . Smoking status: Never Smoker   . Smokeless tobacco: Not on file  . Alcohol Use: No   OB History   Grav Para Term Preterm Abortions TAB SAB Ect Mult Living                 Review of Systems  Allergies  Review of patient's allergies indicates no known allergies.  Home Medications   Current Outpatient Rx  Name  Route  Sig  Dispense  Refill  . fluticasone (FLONASE) 50 MCG/ACT nasal spray   Nasal   Place 2 sprays into the nose daily as needed for rhinitis.         . naproxen (NAPROSYN) 500 MG tablet   Oral   Take 1 tablet (500 mg total) by mouth 2 (two) times daily with a meal.   40 tablet   1   . naproxen sodium (ANAPROX) 220 MG tablet   Oral   Take 1-2 tablets (220-440 mg total) by mouth 2 (two) times daily with a meal.   30 tablet   2   . oxyCODONE (OXY IR/ROXICODONE) 5 MG immediate release tablet   Oral   Take 1-2 tablets (5-10 mg total) by mouth every 4 (four) hours as needed.   50 tablet   0   . promethazine (PHENERGAN) 12.5 MG tablet   Oral   Take 1-2 tablets (12.5-25 mg total) by mouth every 6 (six) hours as needed for nausea.   20 tablet   3    BP 128/91  Pulse 79  Temp(Src) 98.2 F (36.8 C) (Oral)  Resp 18  SpO2 100%  LMP 04/17/2013 Physical Exam  Nursing note and vitals reviewed. Constitutional: She is oriented to person, place, and time. She appears well-developed and well-nourished. No distress.  HENT:  Mouth/Throat: Oropharynx is clear and moist. No oropharyngeal exudate.  Bilateral TMs are normal  Eyes: Conjunctivae are normal. Pupils are equal, round, and reactive to  light.  Neck: Normal range of motion. Neck supple.  Cardiovascular: Normal rate, regular rhythm and normal heart sounds.   Pulmonary/Chest: Effort normal and breath sounds normal. No respiratory distress. She has no wheezes. She has no rales.  Musculoskeletal:  Right lower extremity with mild swelling of the thigh and upper half of the lower leg. It is associated with light erythema. The medial aspect of the thigh and lower leg are tender. There is 1+ pitting edema in both lower extremities. Bilateral pedal pulses are 2+.  Lymphadenopathy:    She has no cervical adenopathy.  2 palpable tender right inguinal lymph nodes. No nodes tender and palpable the left  Neurological: She is alert and oriented to person, place, and time. She exhibits normal muscle tone.  Skin: Skin is warm and dry. No rash noted. There is erythema.  Psychiatric: She has a  normal mood and affect.    ED Course  Procedures (including critical care time) Labs Review Labs Reviewed  D-DIMER, QUANTITATIVE - Abnormal; Notable for the following:    D-Dimer, Quant 1.75 (*)    All other components within normal limits  CBC WITH DIFFERENTIAL - Abnormal; Notable for the following:    Hemoglobin 11.3 (*)    HCT 35.6 (*)    Eosinophils Relative 6 (*)    Basophils Relative 2 (*)    All other components within normal limits   Imaging Review No results found.   MDM   1. Right leg pain   2. Leg edema, right   3. URI (upper respiratory infection)   4. Elevated d-dimer     The patient is being transferred to the emergency department via shuttle due to pain and swelling in the right leg and a positive d-dimer; evaluation for DVT.  The WBC count is normal.    Hayden Rasmussenavid Arbie Reisz, NP 04/17/13 [phone]

## 2013-04-18 ENCOUNTER — Encounter (HOSPITAL_COMMUNITY): Payer: Self-pay | Admitting: Internal Medicine

## 2013-04-18 DIAGNOSIS — M7989 Other specified soft tissue disorders: Secondary | ICD-10-CM

## 2013-04-18 DIAGNOSIS — I2699 Other pulmonary embolism without acute cor pulmonale: Secondary | ICD-10-CM

## 2013-04-18 LAB — COMPREHENSIVE METABOLIC PANEL
ALT: 16 U/L (ref 0–35)
AST: 14 U/L (ref 0–37)
Albumin: 3.2 g/dL — ABNORMAL LOW (ref 3.5–5.2)
Alkaline Phosphatase: 62 U/L (ref 39–117)
BUN: 13 mg/dL (ref 6–23)
CO2: 23 mEq/L (ref 19–32)
Calcium: 8.7 mg/dL (ref 8.4–10.5)
Chloride: 103 mEq/L (ref 96–112)
Creatinine, Ser: 0.68 mg/dL (ref 0.50–1.10)
GFR calc Af Amer: >90 mL/min (ref >90)
GFR calc non Af Amer: >90 mL/min (ref >90)
Glucose, Bld: 84 mg/dL (ref 70–99)
Potassium: 3.7 mEq/L (ref 3.7–5.3)
Sodium: 139 mEq/L (ref 137–147)
Total Bilirubin: 0.9 mg/dL (ref 0.3–1.2)
Total Protein: 6.7 g/dL (ref 6.0–8.3)

## 2013-04-18 LAB — CBC
HCT: 34.8 % — ABNORMAL LOW (ref 36.0–46.0)
Hemoglobin: 11.1 g/dL — ABNORMAL LOW (ref 12.0–15.0)
MCH: 28.5 pg (ref 26.0–34.0)
MCHC: 31.9 g/dL (ref 30.0–36.0)
MCV: 89.5 fL (ref 78.0–100.0)
Platelets: 240 10*3/uL (ref 150–400)
RBC: 3.89 MIL/uL (ref 3.87–5.11)
RDW: 13.4 % (ref 11.5–15.5)
WBC: 4.8 10*3/uL (ref 4.0–10.5)

## 2013-04-18 LAB — HOMOCYSTEINE: Homocysteine: 9.2 umol/L (ref 4.0–15.4)

## 2013-04-18 LAB — TSH: TSH: 1.599 u[IU]/mL (ref 0.350–4.500)

## 2013-04-18 LAB — HEPARIN LEVEL (UNFRACTIONATED): Heparin Unfractionated: 0.42 [IU]/mL (ref 0.30–0.70)

## 2013-04-18 LAB — PROTIME-INR
INR: 1.14 (ref 0.00–1.49)
Prothrombin Time: 14.4 seconds (ref 11.6–15.2)

## 2013-04-18 LAB — MAGNESIUM: Magnesium: 1.7 mg/dL (ref 1.5–2.5)

## 2013-04-18 LAB — PHOSPHORUS: Phosphorus: 3.5 mg/dL (ref 2.3–4.6)

## 2013-04-18 LAB — ANTITHROMBIN III: AntiThromb III Func: 75 % (ref 75–120)

## 2013-04-18 LAB — TROPONIN I: Troponin I: <0.3 ng/mL (ref <0.30)

## 2013-04-18 MED ORDER — WARFARIN - PHARMACIST DOSING INPATIENT: Freq: Every day | Status: DC

## 2013-04-18 MED ORDER — RIVAROXABAN 20 MG PO TABS: 20.0000 mg | ORAL_TABLET | Freq: Every day | ORAL | Status: DC

## 2013-04-18 MED ORDER — SENNA 8.6 MG PO TABS
1.0000 | ORAL_TABLET | Freq: Two times a day (BID) | ORAL | Status: DC
  Administered 2013-04-18 – 2013-04-19 (×3): 8.6 mg via ORAL
  Filled 2013-04-18 (×7): qty 1

## 2013-04-18 MED ORDER — WARFARIN SODIUM 10 MG PO TABS
10.0000 mg | ORAL_TABLET | Freq: Once | ORAL | Status: DC
  Filled 2013-04-18: qty 1

## 2013-04-18 MED ORDER — BISACODYL 10 MG RE SUPP: 10.0000 mg | Freq: Every day | RECTAL | Status: DC | PRN

## 2013-04-18 MED ORDER — DOCUSATE SODIUM 100 MG PO CAPS
100.0000 mg | ORAL_CAPSULE | Freq: Two times a day (BID) | ORAL | Status: DC
  Administered 2013-04-18 – 2013-04-19 (×3): 100 mg via ORAL
  Filled 2013-04-18 (×6): qty 1

## 2013-04-18 MED ORDER — POLYETHYLENE GLYCOL 3350 17 G PO PACK
17.0000 g | PACK | Freq: Every day | ORAL | Status: DC | PRN
  Filled 2013-04-18: qty 1

## 2013-04-18 MED ORDER — COUMADIN BOOK
Freq: Once | Status: DC
  Filled 2013-04-18: qty 1

## 2013-04-18 MED ORDER — HYDROCODONE-ACETAMINOPHEN 5-325 MG PO TABS
1.0000 | ORAL_TABLET | ORAL | Status: DC | PRN
  Administered 2013-04-18: 2 via ORAL
  Filled 2013-04-18: qty 2

## 2013-04-18 MED ORDER — RIVAROXABAN 15 MG PO TABS
15.0000 mg | ORAL_TABLET | Freq: Two times a day (BID) | ORAL | Status: AC
  Administered 2013-04-18 (×2): 15 mg via ORAL
  Filled 2013-04-18 (×2): qty 1

## 2013-04-18 MED ORDER — RIVAROXABAN 15 MG PO TABS
15.0000 mg | ORAL_TABLET | Freq: Two times a day (BID) | ORAL | Status: DC
  Administered 2013-04-19 – 2013-04-20 (×3): 15 mg via ORAL
  Filled 2013-04-18 (×5): qty 1

## 2013-04-18 MED ORDER — SODIUM CHLORIDE 0.9 % IJ SOLN
3.0000 mL | Freq: Two times a day (BID) | INTRAMUSCULAR | Status: DC
  Administered 2013-04-18 – 2013-04-19 (×3): 3 mL via INTRAVENOUS

## 2013-04-18 MED ORDER — RIVAROXABAN (XARELTO) EDUCATION KIT FOR DVT/PE PATIENTS
PACK | Freq: Once | Status: AC
  Administered 2013-04-18: 12:00:00
  Filled 2013-04-18: qty 1

## 2013-04-18 MED ORDER — ONDANSETRON HCL 4 MG PO TABS: 4.0000 mg | ORAL_TABLET | Freq: Four times a day (QID) | ORAL | Status: DC | PRN

## 2013-04-18 MED ORDER — SODIUM CHLORIDE 0.9 % IV SOLN
INTRAVENOUS | Status: DC
  Administered 2013-04-18: 01:00:00 via INTRAVENOUS

## 2013-04-18 MED ORDER — WARFARIN VIDEO: Freq: Once | Status: DC

## 2013-04-18 MED ORDER — ACETAMINOPHEN 325 MG PO TABS: 650.0000 mg | ORAL_TABLET | Freq: Four times a day (QID) | ORAL | Status: DC | PRN

## 2013-04-18 MED ORDER — ONDANSETRON HCL 4 MG/2ML IJ SOLN: 4.0000 mg | Freq: Four times a day (QID) | INTRAMUSCULAR | Status: DC | PRN

## 2013-04-18 MED ORDER — ACETAMINOPHEN 650 MG RE SUPP: 650.0000 mg | Freq: Four times a day (QID) | RECTAL | Status: DC | PRN

## 2013-04-18 NOTE — Progress Notes (Signed)
Bilateral lower extremity venous duplex:  No evidence of DVTor Baker's Cyst.  There appears to be superficial thrombus in the right greater saphenous vein.

## 2013-04-18 NOTE — Progress Notes (Signed)
[facility] PROGRESS NOTE  Brandi Rivas AVW:[id number] DOB: 1960/03/10 DOA: 04/17/2013 PCP: [name],[name], MD  Assessment/Plan: 1. Pulmonary embolism/likely DVT too -hemodynamics stable, mild clot burden -discussed anticoagulation options with patient, she wanted Xarelto -will transition to Xarelto, keep on tele -FU hypercoagulable panel -Needs Colonoscopy, uptodate on mammograms and PAP smears -DC heparin  2. HTN -stable, diet controlled now  Code Status: Full Code Family Communication: d/w pt and son at bedside Disposition Plan: home in 1-2days     HPI/Subjective: Feels better today  Objective: Filed Vitals:   04/18/13 0300  BP: 135/83  Pulse: 79  Temp: 98 F (36.7 C)  Resp: 19    Intake/Output Summary (Last 24 hours) at 04/18/13 1150 Last data filed at 04/18/13 1022  Gross per 24 hour  Intake    360 ml  Output    800 ml  Net   -440 ml   Filed Weights   04/17/13 1919 04/18/13 0300  Weight: 99.791 kg (220 lb) 98.9 kg (218 lb 0.6 oz)    Exam:   General:  AAOx3, no distress  Cardiovascular: S1S2/RRR  Respiratory: CTAB  Abdomen: soft, Nt, BS present  Musculoskeletal: RLE 2 plus edema   Data Reviewed: Basic Metabolic Panel:  Recent Labs Lab 04/17/13 2112 04/18/13 0443  NA 139 139  K 4.2 3.7  CL 103 103  CO2 26 23  GLUCOSE 94 84  BUN 16 13  CREATININE 0.66 0.68  CALCIUM 8.9 8.7  MG  --  1.7  PHOS  --  3.5   Liver Function Tests:  Recent Labs Lab 04/18/13 0443  AST 14  ALT 16  ALKPHOS 62  BILITOT 0.9  PROT 6.7  ALBUMIN 3.2*   No results found for this basename: LIPASE, AMYLASE,  in the last 168 hours No results found for this basename: AMMONIA,  in the last 168 hours CBC:  Recent Labs Lab 04/17/13 1814  WBC 4.9  NEUTROABS 2.2  HGB 11.3*  HCT 35.6*  MCV 90.8  PLT 237   Cardiac Enzymes:  Recent Labs Lab 04/18/13 0443  TROPONINI <0.30   BNP (last 3 results) No results found for this basename: PROBNP,   in the last 8760 hours CBG: No results found for this basename: GLUCAP,  in the last 168 hours  No results found for this or any previous visit (from the past 240 hour(s)).   Studies: Ct Angio Chest Pe W/cm &/or Wo Cm  04/17/2013   CLINICAL DATA:  Elevated D-dimer.  Short of breath  EXAM: CT ANGIOGRAPHY CHEST WITH CONTRAST  TECHNIQUE: Multidetector CT imaging of the chest was performed using the standard protocol during bolus administration of intravenous contrast. Multiplanar CT image reconstructions and MIPs were obtained to evaluate the vascular anatomy.  CONTRAST:  100mL OMNIPAQUE IOHEXOL 350 MG/ML SOLN  COMPARISON:  CT chest 03/03/2010,  FINDINGS: Filling defects in the right lower lobe pulmonary artery compatible with pulmonary embolism. No other emboli are identified. Mild clot burden. Negative for right heart strain.  Heart is enlarged.  No pericardial effusion.  The lungs are clear. Negative for infiltrate or effusion. Negative for mass or adenopathy. Upper abdomen is negative.  Review of the MIP images confirms the above findings.  IMPRESSION: Right lower lobe pulmonary embolism.  Mild clot burden.  Critical Value/emergent results were called by telephone at the time of interpretation on 04/17/2013 at 10:44 PM to Dr. Pricilla LovelessSCOTT GOLDSTON , who verbally acknowledged these results.   Electronically Signed   By: Leonette Mostharles  Chestine Spore M.D.   On: 04/17/2013 22:45    Scheduled Meds: . docusate sodium  100 mg Oral BID  . senna  1 tablet Oral BID  . sodium chloride  3 mL Intravenous Q12H   Continuous Infusions:  Antibiotics Given (last 72 hours)   None      Active Problems:   Hypertension   Pulmonary embolus   Pulmonary emboli    Time spent:    [facility]  [facility] Pager [phone]. If 7PM-7AM, please contact night-coverage at [email or web address], password [facility] 04/18/2013, 11:50 AM  LOS: 1 day

## 2013-04-18 NOTE — Progress Notes (Signed)
Echocardiogram 2D Echocardiogram has been performed.  Estelle GrumblesMyers, Stefany Starace J 04/18/2013, 3:49 PM

## 2013-04-18 NOTE — Progress Notes (Signed)
ANTICOAGULATION CONSULT NOTE - Follow Up Consult  Pharmacy Consult for heparin Indication: pulmonary embolus  Labs:  Recent Labs  04/17/13 1814 04/17/13 2112 04/18/13 0443  HGB 11.3*  --   --   HCT 35.6*  --   --   PLT 237  --   --   LABPROT  --   --  14.4  INR  --   --  1.14  HEPARINUNFRC  --   --  0.42  CREATININE  --  0.66  --     Assessment/Plan:  53yo female therapeutic on heparin with initial dosing for PE.  Will continue gtt at current rate and confirm stable with additional level.  Brandi Rivas, PharmD, BCPS  04/18/2013,7:26 AM

## 2013-04-18 NOTE — Progress Notes (Signed)
ANTICOAGULATION CONSULT NOTE - Follow Up Consult  Pharmacy Consult for heparin>>rivaroxaban Indication: pulmonary embolus  No Known Allergies  Patient Measurements: Height: 5\' 6"  (167.6 cm) Weight: 218 lb 0.6 oz (98.9 kg) IBW/kg (Calculated) : 59.3  Labs:  Recent Labs  04/17/13 1814 04/17/13 2112 04/18/13 0443  HGB 11.3*  --   --   HCT 35.6*  --   --   PLT 237  --   --   LABPROT  --   --  14.4  INR  --   --  1.14  HEPARINUNFRC  --   --  0.42  CREATININE  --  0.66 0.68  TROPONINI  --   --  <0.30    Estimated Creatinine Clearance: 98.6 ml/min (by C-G formula based on Cr of 0.68).  Assessment: 53 year old female admitted with pulmonary embolus. Initially started on heparin, now transitioning to Xarelto PE dosing. Will stop heparin infusion and start Xarelto this am, will continue bid dosing for 21 days then transition to 20mg  daily with dinner.  Goal of Therapy:  Therapeutic Anticoagulation Monitor platelets by anticoagulation protocol: Yes   Plan:  Rivaroxaban 15mg  bid x21 days then 20mg  daily Will provide education packet today and follow up with patient on any questions  Sheppard CoilFrank Wilson PharmD., BCPS Clinical Pharmacist Pager [phone] 04/18/2013 12:00 PM

## 2013-04-18 NOTE — ED Provider Notes (Signed)
Medical screening examination/treatment/procedure(s) were performed by non-physician practitioner and as supervising physician I was immediately available for consultation/collaboration.  Jesus Nevills, M.D.  Ervie Mccard C Tida Saner, MD 04/18/13 0838 

## 2013-04-19 DIAGNOSIS — E669 Obesity, unspecified: Secondary | ICD-10-CM

## 2013-04-19 DIAGNOSIS — K5900 Constipation, unspecified: Secondary | ICD-10-CM

## 2013-04-19 LAB — CBC
HCT: 33.6 % — ABNORMAL LOW (ref 36.0–46.0)
Hemoglobin: 10.7 g/dL — ABNORMAL LOW (ref 12.0–15.0)
MCH: 28.5 pg (ref 26.0–34.0)
MCHC: 31.8 g/dL (ref 30.0–36.0)
MCV: 89.4 fL (ref 78.0–100.0)
Platelets: 241 10*3/uL (ref 150–400)
RBC: 3.76 MIL/uL — ABNORMAL LOW (ref 3.87–5.11)
RDW: 13.6 % (ref 11.5–15.5)
WBC: 4.4 10*3/uL (ref 4.0–10.5)

## 2013-04-19 NOTE — Discharge Instructions (Signed)
Information on my medicine - XARELTO® (rivaroxaban) ° °This medication education was reviewed with me or my healthcare representative as part of my discharge preparation.  The pharmacist that spoke with me during my hospital stay was:  Cher Egnor Rhea, RPH ° °WHY WAS XARELTO® PRESCRIBED FOR YOU? °Xarelto® was prescribed to treat blood clots that may have been found in the veins of your legs (deep vein thrombosis) or in your lungs (pulmonary embolism) and to reduce the risk of them occurring again. ° °What do you need to know about Xarelto®? °The starting dose is one 15 mg tablet taken TWICE daily with food for the FIRST 21 DAYS then on (enter date) 05/09/13 the dose is changed to one 20 mg tablet taken ONCE A DAY with your evening meal. ° °DO NOT stop taking Xarelto® without talking to the health care provider who prescribed the medication.  Refill your prescription for 20 mg tablets before you run out. ° °After discharge, you should have regular check-up appointments with your healthcare provider that is prescribing your Xarelto®.  In the future your dose may need to be changed if your kidney function changes by a significant amount. ° °What do you do if you miss a dose? °If you are taking Xarelto® TWICE DAILY and you miss a dose, take it as soon as you remember. You may take two 15 mg tablets (total 30 mg) at the same time then resume your regularly scheduled 15 mg twice daily the next day. ° °If you are taking Xarelto® ONCE DAILY and you miss a dose, take it as soon as you remember on the same day then continue your regularly scheduled once daily regimen the next day. Do not take two doses of Xarelto® at the same time.  ° °Important Safety Information °Xarelto® is a blood thinner medicine that can cause bleeding. You should call your healthcare provider right away if you experience any of the following: °  Bleeding from an injury or your nose that does not stop. °  Unusual colored urine (red or dark brown) or  unusual colored stools (red or black). °  Unusual bruising for unknown reasons. °  A serious fall or if you hit your head (even if there is no bleeding). ° °Some medicines may interact with Xarelto® and might increase your risk of bleeding while on Xarelto®. To help avoid this, consult your healthcare provider or pharmacist prior to using any new prescription or non-prescription medications, including herbals, vitamins, non-steroidal anti-inflammatory drugs (NSAIDs) and supplements. ° °This website has more information on Xarelto®: www.xarelto.com. ° °

## 2013-04-19 NOTE — Progress Notes (Signed)
[facility] PROGRESS NOTE  Brandi Rivas WUJ:[id number] DOB: [birth year] DOA: 04/17/2013 PCP: Lupe Carney, MD  Assessment/Plan: 1. Pulmonary embolism and Superficial thrombus in R GSV -hemodynamics stable, mild clot burden -discussed anticoagulation options with patient yesterday -transitioned to Xarelto 3/15, keep on tele -FU hypercoagulable panel -Needs Colonoscopy, uptodate on mammograms and PAP smears -home tomorrow  2. HTN -stable, diet controlled now  Code Status: Full Code Family Communication: d/w pt at bedside Disposition Plan: home tomorrow     HPI/Subjective: Feels better today  Objective: Filed Vitals:   04/19/13 1352  BP: 110/72  Pulse: 83  Temp: 98.2 F (36.8 C)  Resp: 19    Intake/Output Summary (Last 24 hours) at 04/19/13 1517 Last data filed at 04/19/13 1300  Gross per 24 hour  Intake    480 ml  Output    300 ml  Net    180 ml   Filed Weights   04/17/13 1919 04/18/13 0300  Weight: 99.791 kg (220 lb) 98.9 kg (218 lb 0.6 oz)    Exam:   General:  AAOx3, no distress  Cardiovascular: S1S2/RRR  Respiratory: CTAB  Abdomen: soft, Nt, BS present  Musculoskeletal: RLE 2 plus edema   Data Reviewed: Basic Metabolic Panel:  Recent Labs Lab 04/17/13 2112 04/18/13 0443  NA 139 139  K 4.2 3.7  CL 103 103  CO2 26 23  GLUCOSE 94 84  BUN 16 13  CREATININE 0.66 0.68  CALCIUM 8.9 8.7  MG  --  1.7  PHOS  --  3.5   Liver Function Tests:  Recent Labs Lab 04/18/13 0443  AST 14  ALT 16  ALKPHOS 62  BILITOT 0.9  PROT 6.7  ALBUMIN 3.2*   No results found for this basename: LIPASE, AMYLASE,  in the last 168 hours No results found for this basename: AMMONIA,  in the last 168 hours CBC:  Recent Labs Lab 04/17/13 1814 04/18/13 1139 04/19/13 0440  WBC 4.9 4.8 4.4  NEUTROABS 2.2  --   --   HGB 11.3* 11.1* 10.7*  HCT 35.6* 34.8* 33.6*  MCV 90.8 89.5 89.4  PLT 237 240 241   Cardiac Enzymes:  Recent Labs Lab  04/18/13 0443  TROPONINI <0.30   BNP (last 3 results) No results found for this basename: PROBNP,  in the last 8760 hours CBG: No results found for this basename: GLUCAP,  in the last 168 hours  No results found for this or any previous visit (from the past 240 hour(s)).   Studies: Ct Angio Chest Pe W/cm &/or Wo Cm  04/17/2013   CLINICAL DATA:  Elevated D-dimer.  Short of breath  EXAM: CT ANGIOGRAPHY CHEST WITH CONTRAST  TECHNIQUE: Multidetector CT imaging of the chest was performed using the standard protocol during bolus administration of intravenous contrast. Multiplanar CT image reconstructions and MIPs were obtained to evaluate the vascular anatomy.  CONTRAST:  OMNIPAQUE IOHEXOL 350 MG/ML SOLN  COMPARISON:  CT chest 03/03/2010,  FINDINGS: Filling defects in the right lower lobe pulmonary artery compatible with pulmonary embolism. No other emboli are identified. Mild clot burden. Negative for right heart strain.  Heart is enlarged.  No pericardial effusion.  The lungs are clear. Negative for infiltrate or effusion. Negative for mass or adenopathy. Upper abdomen is negative.  Review of the MIP images confirms the above findings.  IMPRESSION: Right lower lobe pulmonary embolism.  Mild clot burden.  Critical Value/emergent results were called by telephone at the time of interpretation on 04/17/2013 at  10:44 PM to Dr. Pricilla LovelessSCOTT GOLDSTON , who verbally acknowledged these results.   Electronically Signed   By: Marlan Palauharles  Clark M.D.   On: 04/17/2013 22:45    Scheduled Meds: . docusate sodium  100 mg Oral BID  . rivaroxaban  15 mg Oral BID WC   Followed by  . [START ON 05/09/2013] rivaroxaban  20 mg Oral Q supper  . senna  1 tablet Oral BID  . sodium chloride  3 mL Intravenous Q12H   Continuous Infusions:  Antibiotics Given (last 72 hours)   None      Active Problems:   Hypertension   Pulmonary embolus   Pulmonary emboli    Time spent: 35min    [facility] CenterJOSEPH,Mildred Tuccillo  [facility] Pager [phone]. If 7PM-7AM, please contact night-coverage at [email or web address], password [email or web address] 04/19/2013, 3:17 PM  LOS: 2 days

## 2013-04-20 LAB — CARDIOLIPIN ANTIBODIES, IGG, IGM, IGA
Anticardiolipin IgA: 23 APL U/mL — ABNORMAL HIGH (ref <22)
Anticardiolipin IgG: 13 GPL U/mL (ref <23)
Anticardiolipin IgM: 6 MPL U/mL — ABNORMAL LOW (ref <11)

## 2013-04-20 LAB — PROTEIN C ACTIVITY: Protein C Activity: 152 % — ABNORMAL HIGH (ref 75–133)

## 2013-04-20 LAB — BETA-2-GLYCOPROTEIN I ABS, IGG/M/A
Beta-2 Glyco I IgG: 6 G Units (ref <20)
Beta-2-Glycoprotein I IgA: 6 A Units (ref <20)
Beta-2-Glycoprotein I IgM: 9 M Units (ref <20)

## 2013-04-20 LAB — CBC
HCT: 34.4 % — ABNORMAL LOW (ref 36.0–46.0)
Hemoglobin: 10.7 g/dL — ABNORMAL LOW (ref 12.0–15.0)
MCH: 27.9 pg (ref 26.0–34.0)
MCHC: 31.1 g/dL (ref 30.0–36.0)
MCV: 89.8 fL (ref 78.0–100.0)
Platelets: 240 10*3/uL (ref 150–400)
RBC: 3.83 MIL/uL — ABNORMAL LOW (ref 3.87–5.11)
RDW: 13.5 % (ref 11.5–15.5)
WBC: 4.8 10*3/uL (ref 4.0–10.5)

## 2013-04-20 LAB — PROTEIN S ACTIVITY: Protein S Activity: 56 % — ABNORMAL LOW (ref 69–129)

## 2013-04-20 LAB — LUPUS ANTICOAGULANT PANEL
DRVVT: 28.7 secs (ref <42.9)
Lupus Anticoagulant: NOT DETECTED
PTT Lupus Anticoagulant: 69.5 secs — ABNORMAL HIGH (ref 28.0–43.0)
PTTLA 4:1 Mix: 75.5 secs — ABNORMAL HIGH (ref 28.0–43.0)
PTTLA Confirmation: 5.4 secs (ref <8.0)

## 2013-04-20 LAB — PROTEIN C, TOTAL: Protein C, Total: 80 % (ref 72–160)

## 2013-04-20 LAB — PROTHROMBIN GENE MUTATION

## 2013-04-20 LAB — FACTOR 5 LEIDEN

## 2013-04-20 LAB — PROTEIN S, TOTAL: Protein S Ag, Total: 69 % (ref 60–150)

## 2013-04-20 MED ORDER — RIVAROXABAN 15 MG PO TABS: 15.0000 mg | ORAL_TABLET | Freq: Two times a day (BID) | ORAL | Status: DC

## 2013-04-20 MED ORDER — UNABLE TO FIND: Status: DC

## 2013-04-20 MED ORDER — RIVAROXABAN 20 MG PO TABS: 20.0000 mg | ORAL_TABLET | Freq: Every day | ORAL | Status: DC

## 2013-04-20 NOTE — Progress Notes (Signed)
DC IV and tele per MD orders; DC instructions reviewed and signed by pt; paper prescriptions and note for work given to pt; no further questions from pt; pt is to follow up with primary.  Park BreedBARNETT, Haidyn Kilburg M, RN

## 2013-04-20 NOTE — Care Management Note (Signed)
    Page 1 of 1   04/20/2013     1:50:19 PM   CARE MANAGEMENT NOTE 04/20/2013  Patient:  Brandi Rivas,Brandi Rivas   Account Number:  [id number]  Date Initiated:  04/20/2013  Documentation initiated by:  Nancye Grumbine  Subjective/Objective Assessment:   PT ADM ON 3/13 WITH ACUTE PE, RT LEG THROMBUS.  PTA, PT INDEPENDENT OF ADLS.     Action/Plan:   CM CONSULT FOR XARELTO.  PT HAS COMMERCIAL INSURANCE; GIVEN [employer] ASSIST CARD--WILL BE ABLE TO GET MED FOR FREE FOR ONE YEAR.   CARD HAS BEEN ACTIVATED.   Anticipated DC Date:  04/20/2013   Anticipated DC Plan:  HOME/SELF CARE      DC Planning Services  CM consult  Medication Assistance      Choice offered to / List presented to:             Status of service:  Completed, signed off Medicare Important Message given?   (If response is "NO", the following Medicare IM given date fields will be blank) Date Medicare IM given:   Date Additional Medicare IM given:    Discharge Disposition:  HOME/SELF CARE  Per UR Regulation:  Reviewed for med. necessity/level of care/duration of stay  If discussed at Long Length of Stay Meetings, dates discussed:    Comments:

## 2013-05-02 NOTE — Discharge Summary (Signed)
Physician Discharge Summary  Errica D Bazin BJY:[id number] DOB: [birth year] DOA: 04/17/2013  PCP: [name],[name], MD  Admit date: 04/17/2013 Discharge date: 05/02/2013  Time spent: 45 minutes  Recommendations for Outpatient Follow-up:  Dr.[name] in 1 week Please FU Hypercoagulable panel Due for Screening colonoscopy  Discharge Diagnoses:  Active Problems:   Hypertension   Pulmonary embolus   Pulmonary emboli   Discharge Condition:stable  Diet recommendation: low sodium  Filed Weights   04/17/13 1919 04/18/13 0300  Weight: 99.791 kg (220 lb) 98.9 kg (218 lb 0.6 oz)    History of present illness:  [name] Eliezer Rivas is a 53 y.o. female  has a past medical history of Hypertension; Sinus congestion; Arthritis; Ventral hernia; Pneumonia (01/09/2011); and Acute urinary retention (12/10/2012).  Presented with  1 week hx of right leg swelling and redness. Patient was evaluated today for this at urgent care and was sent to ER where she had A CTA done and was found to have PE. Patient has some associated shortness of breath and palpitations. No recent travel, patient states she is not sedentary. She works as a Conservation officer, naturecashier. Her son had a DVT after getting hit by a motorcycle. She have had negative mamograms done never had a colonoscopy. States her weight is fluctuating. Hospitalist was called for admission.  Patient states that shed history of hypertension but currently not taking anything for it and her pressure has been doing  Better.  Hospital Course:  1. Pulmonary embolism and Superficial thrombus in R GSV -hemodynamics stable, mild clot burden on CT chest -discussed anticoagulation options with patient yesterday  -transitioned to Xarelto 3/15  -FU hypercoagulable panel  -Needs Colonoscopy, uptodate on mammograms and PAP smears  -home today  2. HTN  -stable, diet controlled now      Discharge Exam: Filed Vitals:   04/20/13 0415  BP: 96/44  Pulse: 75  Temp: 98.1 F (36.7 C)   Resp: 19    General: AAOx3 Cardiovascular:S1S2/RRR Respiratory: CTAB  Discharge Instructions  Discharge Orders   Future Orders Complete By Expires   Diet - low sodium heart healthy  As directed    Increase activity slowly  As directed        Medication List    STOP taking these medications       pseudoephedrine-acetaminophen 30-500 MG Tabs  Commonly known as:  TYLENOL SINUS      TAKE these medications       fluticasone 50 MCG/ACT nasal spray  Commonly known as:  FLONASE  Place 2 sprays into the nose daily as needed for rhinitis.     Rivaroxaban 15 MG Tabs tablet  Commonly known as:  XARELTO  Take 1 tablet (15 mg total) by mouth 2 (two) times daily with a meal. Take till 4/3     Rivaroxaban 20 MG Tabs tablet  Commonly known as:  XARELTO  Take 1 tablet (20 mg total) by mouth daily with supper. Starting 4/4  Start taking on:  05/09/2013     UNABLE TO FIND  This note is to excuse [name] from work due to medical illness requiring Hospitalization, 3/13 through 3/18       No Known Allergies     Follow-up Information   Follow up with [name],[name], MD.   Specialty:  Family Medicine   Contact information:   [address] [place] [address] [phone]       Please follow up. (APPT. WITH DR Lupe CarneyEAN MITCHELL ON THURSDAY  MARCH 19 TH AT  3:00 PM  PT NEED TO  CHECK IN AT 2:45)        The results of significant diagnostics from this hospitalization (including imaging, microbiology, ancillary and laboratory) are listed below for reference.    Significant Diagnostic Studies: Ct Angio Chest Pe W/cm &/or Wo Cm  04/17/2013   CLINICAL DATA:  Elevated D-dimer.  Short of breath  EXAM: CT ANGIOGRAPHY CHEST WITH CONTRAST  TECHNIQUE: Multidetector CT imaging of the chest was performed using the standard protocol during bolus administration of intravenous contrast. Multiplanar CT image reconstructions and MIPs were obtained to evaluate the vascular anatomy.   CONTRAST:  OMNIPAQUE IOHEXOL 350 MG/ML SOLN  COMPARISON:  CT chest 03/03/2010,  FINDINGS: Filling defects in the right lower lobe pulmonary artery compatible with pulmonary embolism. No other emboli are identified. Mild clot burden. Negative for right heart strain.  Heart is enlarged.  No pericardial effusion.  The lungs are clear. Negative for infiltrate or effusion. Negative for mass or adenopathy. Upper abdomen is negative.  Review of the MIP images confirms the above findings.  IMPRESSION: Right lower lobe pulmonary embolism.  Mild clot burden.  Critical Value/emergent results were called by telephone at the time of interpretation on 04/17/2013 at 10:44 PM to Dr. Pricilla Loveless , who verbally acknowledged these results.   Electronically Signed   By: Marlan Palau M.D.   On: 04/17/2013 22:45    Microbiology: No results found for this or any previous visit (from the past 240 hour(s)).   Labs: Basic Metabolic Panel: No results found for this basename: NA, K, CL, CO2, GLUCOSE, BUN, CREATININE, CALCIUM, MG, PHOS,  in the last 168 hours Liver Function Tests: No results found for this basename: AST, ALT, ALKPHOS, BILITOT, PROT, ALBUMIN,  in the last 168 hours No results found for this basename: LIPASE, AMYLASE,  in the last 168 hours No results found for this basename: AMMONIA,  in the last 168 hours CBC: No results found for this basename: WBC, NEUTROABS, HGB, HCT, MCV, PLT,  in the last 168 hours Cardiac Enzymes: No results found for this basename: CKTOTAL, CKMB, CKMBINDEX, TROPONINI,  in the last 168 hours BNP: BNP (last 3 results) No results found for this basename: PROBNP,  in the last 8760 hours CBG: No results found for this basename: GLUCAP,  in the last 168 hours     Signed:  Wojciech Willetts  [facility] 05/02/2013, 3:13 PM

## 2013-07-07 ENCOUNTER — Other Ambulatory Visit: Payer: Self-pay

## 2013-07-07 DIAGNOSIS — Z1231 Encounter for screening mammogram for malignant neoplasm of breast: Secondary | ICD-10-CM

## 2013-08-17 ENCOUNTER — Ambulatory Visit
Admission: RE | Admit: 2013-08-17 | Discharge: 2013-08-17 | Disposition: A | Discharge status: Home / Self Care | Payer: BC Managed Care – PPO | Source: Ambulatory Visit

## 2013-08-17 ENCOUNTER — Encounter (INDEPENDENT_AMBULATORY_CARE_PROVIDER_SITE_OTHER): Payer: Self-pay

## 2013-08-17 DIAGNOSIS — Z1231 Encounter for screening mammogram for malignant neoplasm of breast: Secondary | ICD-10-CM

## 2014-02-07 ENCOUNTER — Emergency Department (HOSPITAL_COMMUNITY): Payer: 59

## 2014-02-07 ENCOUNTER — Emergency Department (HOSPITAL_COMMUNITY)
Admission: EM | Admit: 2014-02-07 | Discharge: 2014-02-07 | Disposition: A | Discharge status: Home / Self Care | Payer: 59 | Attending: Emergency Medicine | Admitting: Emergency Medicine

## 2014-02-07 ENCOUNTER — Encounter (HOSPITAL_COMMUNITY): Payer: Self-pay | Admitting: Emergency Medicine

## 2014-02-07 DIAGNOSIS — R05 Cough: Secondary | ICD-10-CM | POA: Insufficient documentation

## 2014-02-07 DIAGNOSIS — Z7901 Long term (current) use of anticoagulants: Secondary | ICD-10-CM | POA: Insufficient documentation

## 2014-02-07 DIAGNOSIS — R0602 Shortness of breath: Secondary | ICD-10-CM | POA: Insufficient documentation

## 2014-02-07 DIAGNOSIS — R059 Cough, unspecified: Secondary | ICD-10-CM

## 2014-02-07 DIAGNOSIS — R079 Chest pain, unspecified: Secondary | ICD-10-CM | POA: Insufficient documentation

## 2014-02-07 DIAGNOSIS — Z8719 Personal history of other diseases of the digestive system: Secondary | ICD-10-CM | POA: Diagnosis not present

## 2014-02-07 DIAGNOSIS — M7989 Other specified soft tissue disorders: Secondary | ICD-10-CM | POA: Diagnosis not present

## 2014-02-07 DIAGNOSIS — Z8701 Personal history of pneumonia (recurrent): Secondary | ICD-10-CM | POA: Diagnosis not present

## 2014-02-07 DIAGNOSIS — Z86711 Personal history of pulmonary embolism: Secondary | ICD-10-CM | POA: Diagnosis not present

## 2014-02-07 DIAGNOSIS — I10 Essential (primary) hypertension: Secondary | ICD-10-CM | POA: Insufficient documentation

## 2014-02-07 DIAGNOSIS — F419 Anxiety disorder, unspecified: Secondary | ICD-10-CM | POA: Diagnosis not present

## 2014-02-07 LAB — I-STAT TROPONIN, ED: Troponin i, poc: 0 ng/mL (ref 0.00–0.08)

## 2014-02-07 LAB — BASIC METABOLIC PANEL
Anion gap: 6 (ref 5–15)
BUN: 12 mg/dL (ref 6–23)
CO2: 22 mmol/L (ref 19–32)
Calcium: 8.5 mg/dL (ref 8.4–10.5)
Chloride: 108 mEq/L (ref 96–112)
Creatinine, Ser: 0.69 mg/dL (ref 0.50–1.10)
GFR calc Af Amer: >90 mL/min (ref >90)
GFR calc non Af Amer: >90 mL/min (ref >90)
Glucose, Bld: 91 mg/dL (ref 70–99)
Potassium: 3.8 mmol/L (ref 3.5–5.1)
Sodium: 136 mmol/L (ref 135–145)

## 2014-02-07 LAB — BRAIN NATRIURETIC PEPTIDE: B Natriuretic Peptide: 63.6 pg/mL (ref 0.0–100.0)

## 2014-02-07 MED ORDER — MORPHINE SULFATE 4 MG/ML IJ SOLN
4.0000 mg | Freq: Once | INTRAMUSCULAR | Status: AC
  Administered 2014-02-07: 4 mg via INTRAVENOUS
  Filled 2014-02-07: qty 1

## 2014-02-07 MED ORDER — GUAIFENESIN-CODEINE 100-10 MG/5ML PO SOLN: 10.0000 mL | Freq: Three times a day (TID) | ORAL | Status: DC | PRN

## 2014-02-07 MED ORDER — IPRATROPIUM-ALBUTEROL 0.5-2.5 (3) MG/3ML IN SOLN
3.0000 mL | Freq: Once | RESPIRATORY_TRACT | Status: AC
  Administered 2014-02-07: 3 mL via RESPIRATORY_TRACT
  Filled 2014-02-07: qty 3

## 2014-02-07 MED ORDER — ONDANSETRON HCL 4 MG/2ML IJ SOLN
4.0000 mg | Freq: Once | INTRAMUSCULAR | Status: AC
  Administered 2014-02-07: 4 mg via INTRAVENOUS
  Filled 2014-02-07: qty 2

## 2014-02-07 MED ORDER — ALBUTEROL SULFATE HFA 108 (90 BASE) MCG/ACT IN AERS: 1.0000 | INHALATION_SPRAY | Freq: Four times a day (QID) | RESPIRATORY_TRACT | Status: DC | PRN

## 2014-02-07 MED ORDER — GUAIFENESIN 100 MG/5ML PO SOLN
5.0000 mL | Freq: Once | ORAL | Status: AC
  Administered 2014-02-07: 100 mg via ORAL
  Filled 2014-02-07: qty 5

## 2014-02-07 MED ORDER — IOHEXOL 350 MG/ML SOLN
100.0000 mL | Freq: Once | INTRAVENOUS | Status: AC | PRN
  Administered 2014-02-07: 75 mL via INTRAVENOUS

## 2014-02-07 MED ORDER — KETOROLAC TROMETHAMINE 30 MG/ML IJ SOLN
30.0000 mg | Freq: Once | INTRAMUSCULAR | Status: AC
  Administered 2014-02-07: 30 mg via INTRAVENOUS
  Filled 2014-02-07: qty 1

## 2014-02-07 NOTE — ED Notes (Signed)
C/o sob since this morning with R sided chest pain and non-productive cough.  Denies nausea and vomiting.  Swelling to bilateral lower extremities.

## 2014-02-07 NOTE — ED Provider Notes (Signed)
CSN: [id number]     Arrival date & time 02/07/14  1744 History   First MD Initiated Contact with Patient 02/07/14 1950     Chief Complaint  Patient presents with  . Shortness of Breath     (Consider location/radiation/quality/duration/timing/severity/associated sxs/prior Treatment) HPI Comments: Patient is a 54 year old female past medical history significant for hypertension, PE presenting to the emergency department for acute onset shortness of breath with associated right-sided pleuritic chest pain with nonproductive cough. She endorses that she has had some swelling to her right lower extremity. No modifying factors identified. She states this feels like her previous PE, she states she is no longer on Xarelto. Denies any fevers, chills, nausea, vomiting, diarrhea.   Past Medical History  Diagnosis Date  . Hypertension     PREVIOUSLY TOOK MED - BP STABLE - TAKEN OFF MED  . Sinus congestion   . Arthritis   . Ventral hernia   . Pneumonia 01/09/2011  . Acute urinary retention 12/10/2012   Past Surgical History  Procedure Laterality Date  . Arthroscopy knee w/ drilling    . Ventral hernia repair N/A 12/09/2012    Procedure: LAPAROSCOPIC VENTRAL WALL HERNIA REPAIR;  Surgeon: Ardeth Sportsman, MD;  Location: [facility];  Service: General;  Laterality: N/A;  . Insertion of mesh N/A 12/09/2012    Procedure: INSERTION OF MESH;  Surgeon: Ardeth Sportsman, MD;  Location: [facility];  Service: General;  Laterality: N/A;   Family History  Problem Relation Age of Onset  . Hypertension Mother   . Diabetes type II Mother   . Hypertension Father   . Colon cancer Father    History  Substance Use Topics  . Smoking status: Never Smoker   . Smokeless tobacco: Not on file  . Alcohol Use: No   OB History    No data available     Review of Systems  Respiratory: Positive for cough and shortness of breath.   Cardiovascular: Positive for chest pain and leg swelling.  All other systems reviewed and are  negative.     Allergies  Review of patient's allergies indicates no known allergies.  Home Medications   Prior to Admission medications   Medication Sig Start Date End Date Taking? Authorizing Provider  albuterol (PROVENTIL HFA;VENTOLIN HFA) 108 (90 BASE) MCG/ACT inhaler Inhale 1-2 puffs into the lungs every 6 (six) hours as needed for wheezing or shortness of breath. 02/07/14   Mieko Kneebone L Nikayla Madaris, PA-C  fluticasone (FLONASE) 50 MCG/ACT nasal spray Place 2 sprays into the nose daily as needed for rhinitis.    Historical Provider, MD  guaiFENesin-codeine 100-10 MG/5ML syrup Take 10 mLs by mouth 3 (three) times daily as needed for cough. 02/07/14   Hridhaan Yohn L Brit Carbonell, PA-C  Rivaroxaban (XARELTO) 15 MG TABS tablet Take 1 tablet (15 mg total) by mouth 2 (two) times daily with a meal. Take till 4/3 04/20/13 05/08/13  Zannie Cove, MD  Rivaroxaban (XARELTO) 20 MG TABS tablet Take 1 tablet (20 mg total) by mouth daily with supper. Starting 4/4 05/09/13   Zannie Cove, MD  UNABLE TO FIND This note is to excuse Ms.Kuhner from work due to medical illness requiring Hospitalization, 3/13 through 3/18 04/20/13   Zannie Cove, MD   BP 123/84 mmHg  Pulse 86  Temp(Src) 98.4 F (36.9 C) (Oral)  Resp 14  SpO2 97%  LMP 01/17/2014 Physical Exam  Constitutional: She is oriented to person, place, and time. She appears well-developed and well-nourished.  HENT:  Head:  Normocephalic and atraumatic.  Right Ear: External ear normal.  Left Ear: External ear normal.  Nose: Nose normal.  Mouth/Throat: Oropharynx is clear and moist. No oropharyngeal exudate.  Eyes: Conjunctivae are normal.  Neck: Neck supple.  Cardiovascular: Normal rate, regular rhythm, normal heart sounds and intact distal pulses.   Pulmonary/Chest: Effort normal and breath sounds normal. Tachypnea noted. She exhibits no tenderness.  Abdominal: Soft. There is no tenderness.  Musculoskeletal: Normal range of motion. She exhibits no  edema or tenderness.  Neurological: She is alert and oriented to person, place, and time.  Skin: Skin is warm and dry.  Psychiatric: Her mood appears anxious.  Nursing note and vitals reviewed.   ED Course  Procedures (including critical care time) Medications  morphine 4 MG/ML injection 4 mg (4 mg Intravenous Given 02/07/14 2015)  ondansetron (ZOFRAN) injection 4 mg (4 mg Intravenous Given 02/07/14 2015)  iohexol (OMNIPAQUE) 350 MG/ML injection 100 mL (75 mLs Intravenous Contrast Given 02/07/14 2103)  ipratropium-albuterol (DUONEB) 0.5-2.5 (3) MG/3ML nebulizer solution 3 mL (3 mLs Nebulization Given 02/07/14 2145)  ketorolac (TORADOL) 30 MG/ML injection 30 mg (30 mg Intravenous Given 02/07/14 2213)  guaiFENesin (ROBITUSSIN) 100 MG/5ML solution 100 mg (100 mg Oral Given 02/07/14 2213)  ipratropium-albuterol (DUONEB) 0.5-2.5 (3) MG/3ML nebulizer solution 3 mL (3 mLs Nebulization Given 02/07/14 2213)    Labs Review Labs Reviewed  BASIC METABOLIC PANEL  BRAIN NATRIURETIC PEPTIDE  I-STAT TROPOININ, ED    Imaging Review Dg Chest 2 View  02/07/2014   CLINICAL DATA:  Shortness of breath, wheezing and right-sided chest pain.  EXAM: CHEST - 2 VIEW  COMPARISON:  01/09/2011  FINDINGS: The heart size and mediastinal contours are within normal limits. There is no evidence of pulmonary edema, consolidation, pneumothorax, nodule or pleural fluid. The visualized skeletal structures are unremarkable.  IMPRESSION: No active disease.   Electronically Signed   By: Irish Lack M.D.   On: 02/07/2014 18:35   Ct Angio Chest Pe W/cm &/or Wo Cm  02/07/2014   CLINICAL DATA:  Acute onset of shortness of breath, right-sided chest pain and nonproductive cough. Bilateral lower extremity swelling. Initial encounter.  EXAM: CT ANGIOGRAPHY CHEST WITH CONTRAST  TECHNIQUE: Multidetector CT imaging of the chest was performed using the standard protocol during bolus administration of intravenous contrast. Multiplanar CT image  reconstructions and MIPs were obtained to evaluate the vascular anatomy.  CONTRAST:  75mL OMNIPAQUE IOHEXOL 350 MG/ML SOLN  COMPARISON:  Chest radiograph performed earlier today at 6:22 p.m., and CTA of the chest performed 04/17/2013  FINDINGS: There is no evidence of pulmonary embolus.  Mild bilateral atelectasis is noted. The lungs are otherwise clear. The interstitium is borderline normal in thickness. There is no evidence of significant focal consolidation, pleural effusion or pneumothorax. No masses are identified; no abnormal focal contrast enhancement is seen.  The heart is mildly enlarged. No mediastinal lymphadenopathy is seen. No pericardial effusion is identified. The great vessels are grossly unremarkable. No axillary lymphadenopathy is seen. The visualized portions of the thyroid gland are unremarkable in appearance.  A 7 mm epicardial fat pad node appears grossly stable from prior studies and likely reflects the patient's baseline.  The visualized portions of the liver and spleen are unremarkable.  No acute osseous abnormalities are seen.  Review of the MIP images confirms the above findings.  IMPRESSION: 1. No evidence of pulmonary embolus. 2. Mild bilateral atelectasis noted; lungs otherwise clear. 3. Mild cardiomegaly noted. 4. 7 mm epicardial fat pad node appears  grossly stable and likely reflects the patient's baseline.   Electronically Signed   By: Roanna Raider M.D.   On: 02/07/2014 21:26     EKG Interpretation None      MDM   Final diagnoses:  Cough  Shortness of breath    Filed Vitals:   02/07/14 2235  BP: 123/84  Pulse: 86  Temp: 98.4 F (36.9 C)  Resp: 14   Afebrile, NAD, non-toxic appearing, AAOx4.  I have reviewed nursing notes, vital signs, and all appropriate lab and imaging results for this patient. Labs and imaging unremarkable. Patient is to be discharged with recommendation to follow up with PCP in regards to today's hospital visit. Chest pain is not likely  of cardiac or pulmonary etiology d/t presentation, negative CT chest, no tracheal deviation, no JVD or new murmur, RRR, breath sounds equal bilaterally, EKG without acute abnormalities, negative troponin, and negative CXR. Shortness of breath improved after IV pain medication, cough suppressant and nebulizer treatments. Will discharge patient home with advised PCP f/u. Return precautions discussed. Patient is agreeable to plan. Patient is stable at time of discharge    Jeannetta Ellis, PA-C 02/08/14 0114  Arby Barrette, MD 02/12/14 [phone]

## 2014-02-07 NOTE — Discharge Instructions (Signed)
Please follow up with your primary care physician in 1-2 days. If you do not have one please call the [facility] number listed above. Please read all discharge instructions and return precautions.    Cough, Adult  A cough is a reflex that helps clear your throat and airways. It can help heal the body or may be a reaction to an irritated airway. A cough may only last 2 or 3 weeks (acute) or may last more than 8 weeks (chronic).  CAUSES Acute cough:  Viral or bacterial infections. Chronic cough:  Infections.  Allergies.  Asthma.  Post-nasal drip.  Smoking.  Heartburn or acid reflux.  Some medicines.  Chronic lung problems (COPD).  Cancer. SYMPTOMS   Cough.  Fever.  Chest pain.  Increased breathing rate.  High-pitched whistling sound when breathing (wheezing).  Colored mucus that you cough up (sputum). TREATMENT   A bacterial cough may be treated with antibiotic medicine.  A viral cough must run its course and will not respond to antibiotics.  Your caregiver may recommend other treatments if you have a chronic cough. HOME CARE INSTRUCTIONS   Only take over-the-counter or prescription medicines for pain, discomfort, or fever as directed by your caregiver. Use cough suppressants only as directed by your caregiver.  Use a cold steam vaporizer or humidifier in your bedroom or home to help loosen secretions.  Sleep in a semi-upright position if your cough is worse at night.  Rest as needed.  Stop smoking if you smoke. SEEK IMMEDIATE MEDICAL CARE IF:   You have pus in your sputum.  Your cough starts to worsen.  You cannot control your cough with suppressants and are losing sleep.  You begin coughing up blood.  You have difficulty breathing.  You develop pain which is getting worse or is uncontrolled with medicine.  You have a fever. MAKE SURE YOU:   Understand these instructions.  Will watch your condition.  Will get help  right away if you are not doing well or get worse. Document Released: 07/21/2010 Document Revised: 04/16/2011 Document Reviewed: 07/21/2010 [facility] Patient Information 2015 [place], [place]. This information is not intended to replace advice given to you by your health care provider. Make sure you discuss any questions you have with your health care provider.

## 2014-04-13 ENCOUNTER — Emergency Department (HOSPITAL_COMMUNITY)
Admission: EM | Admit: 2014-04-13 | Discharge: 2014-04-13 | Disposition: A | Discharge status: Home / Self Care | Payer: 59 | Attending: Emergency Medicine | Admitting: Emergency Medicine

## 2014-04-13 ENCOUNTER — Emergency Department (HOSPITAL_COMMUNITY): Payer: 59

## 2014-04-13 ENCOUNTER — Encounter (HOSPITAL_COMMUNITY): Payer: Self-pay

## 2014-04-13 DIAGNOSIS — Z7901 Long term (current) use of anticoagulants: Secondary | ICD-10-CM | POA: Insufficient documentation

## 2014-04-13 DIAGNOSIS — J069 Acute upper respiratory infection, unspecified: Secondary | ICD-10-CM

## 2014-04-13 DIAGNOSIS — R05 Cough: Secondary | ICD-10-CM | POA: Diagnosis present

## 2014-04-13 DIAGNOSIS — Z8719 Personal history of other diseases of the digestive system: Secondary | ICD-10-CM | POA: Diagnosis not present

## 2014-04-13 DIAGNOSIS — R059 Cough, unspecified: Secondary | ICD-10-CM

## 2014-04-13 DIAGNOSIS — Z8739 Personal history of other diseases of the musculoskeletal system and connective tissue: Secondary | ICD-10-CM | POA: Insufficient documentation

## 2014-04-13 DIAGNOSIS — Z8701 Personal history of pneumonia (recurrent): Secondary | ICD-10-CM | POA: Diagnosis not present

## 2014-04-13 DIAGNOSIS — M545 Low back pain: Secondary | ICD-10-CM | POA: Diagnosis not present

## 2014-04-13 DIAGNOSIS — Z7951 Long term (current) use of inhaled steroids: Secondary | ICD-10-CM | POA: Diagnosis not present

## 2014-04-13 DIAGNOSIS — I10 Essential (primary) hypertension: Secondary | ICD-10-CM | POA: Diagnosis not present

## 2014-04-13 LAB — URINALYSIS, ROUTINE W REFLEX MICROSCOPIC
Bilirubin Urine: NEGATIVE
Glucose, UA: NEGATIVE mg/dL
Hgb urine dipstick: NEGATIVE
Ketones, ur: 40 mg/dL — AB
Leukocytes, UA: NEGATIVE
Nitrite: NEGATIVE
Protein, ur: NEGATIVE mg/dL
Specific Gravity, Urine: 1.014 (ref 1.005–1.030)
Urobilinogen, UA: 1 mg/dL (ref 0.0–1.0)
pH: 7 (ref 5.0–8.0)

## 2014-04-13 MED ORDER — ACETAMINOPHEN 325 MG PO TABS
650.0000 mg | ORAL_TABLET | Freq: Once | ORAL | Status: AC
  Administered 2014-04-13: 650 mg via ORAL
  Filled 2014-04-13: qty 2

## 2014-04-13 MED ORDER — AZITHROMYCIN 250 MG PO TABS: 250.0000 mg | ORAL_TABLET | Freq: Every day | ORAL | Status: DC

## 2014-04-13 MED ORDER — AZITHROMYCIN 250 MG PO TABS
500.0000 mg | ORAL_TABLET | Freq: Once | ORAL | Status: AC
  Administered 2014-04-13: 500 mg via ORAL
  Filled 2014-04-13: qty 2

## 2014-04-13 NOTE — ED Provider Notes (Signed)
CSN: [id number]     Arrival date & time 04/13/14  1609 History  This chart was scribed for Brandi Peliffany Earnestine Tuohey, PA-C working with Elwin MochaBlair Walden, Brandi Rivas by Evon Slackerrance Branch, ED Scribe. This patient was seen in room WTR8/WTR8 and the patient's care was started at 6:01 PM.    Chief Complaint  Patient presents with  . Cough   Patient is a 54 y.o. female presenting with cough. The history is provided by the patient. No language interpreter was used.  Cough Associated symptoms: chest pain   Associated symptoms: no fever and no sore throat    HPI Comments: Brandi Rivas is a 54 y.o. female who presents to the Emergency Department complaining of intermittent cough onset November 2015. Pt states she has associated chest pain brought on from coughing. Pt states she is also having back pain. Pt states she has tried tussionex that previously provided slight relief. Pt states she has also tried an albuterol inhaler and prednisone with no relief. Pt denies any recent antibiotic use. Pt denies sore throat, fever or other related symptoms.   Past Medical History  Diagnosis Date  . Hypertension     PREVIOUSLY TOOK MED - BP STABLE - TAKEN OFF MED  . Sinus congestion   . Arthritis   . Ventral hernia   . Pneumonia 01/09/2011  . Acute urinary retention 12/10/2012   Past Surgical History  Procedure Laterality Date  . Arthroscopy knee w/ drilling    . Ventral hernia repair N/A 12/09/2012    Procedure: LAPAROSCOPIC VENTRAL WALL HERNIA REPAIR;  Surgeon: Ardeth SportsmanSteven C. Gross, Brandi Rivas;  Location: [facility];  Service: General;  Laterality: N/A;  . Insertion of mesh N/A 12/09/2012    Procedure: INSERTION OF MESH;  Surgeon: Ardeth SportsmanSteven C. Gross, Brandi Rivas;  Location: [facility];  Service: General;  Laterality: N/A;   Family History  Problem Relation Age of Onset  . Hypertension Mother   . Diabetes type II Mother   . Hypertension Father   . Colon cancer Father    History  Substance Use Topics  . Smoking status: Never Smoker   . Smokeless  tobacco: Not on file  . Alcohol Use: No   OB History    No data available     Review of Systems  Constitutional: Negative for fever.  HENT: Negative for sore throat.   Respiratory: Positive for cough.   Cardiovascular: Positive for chest pain.  Musculoskeletal: Positive for back pain.    Allergies  Review of patient's allergies indicates no known allergies.  Home Medications   Prior to Admission medications   Medication Sig Start Date End Date Taking? Authorizing Provider  albuterol (PROVENTIL HFA;VENTOLIN HFA) 108 (90 BASE) MCG/ACT inhaler Inhale 1-2 puffs into the lungs every 6 (six) hours as needed for wheezing or shortness of breath. 02/07/14  Yes Jennifer Piepenbrink, PA-C  beclomethasone (QVAR) 40 MCG/ACT inhaler Inhale 2 puffs into the lungs 2 (two) times daily.   Yes Historical Provider, Brandi Rivas  fluticasone (FLONASE) 50 MCG/ACT nasal spray Place 2 sprays into the nose daily as needed for rhinitis.   Yes Historical Provider, Brandi Rivas  guaiFENesin-codeine 100-10 MG/5ML syrup Take 10 mLs by mouth 3 (three) times daily as needed for cough. 02/07/14  Yes Jennifer Piepenbrink, PA-C  IRON PO Take 1 tablet by mouth daily.   Yes Historical Provider, Brandi Rivas  Omega-3 Fatty Acids (FISH OIL PO) Take 1 tablet by mouth daily.   Yes Historical Provider, Brandi Rivas  azithromycin (ZITHROMAX) 250 MG tablet Take 1 tablet (  250 mg total) by mouth daily. 1 tab every day until finished 04/13/14   Brandi Pel, PA-C  Rivaroxaban (XARELTO) 15 MG TABS tablet Take 1 tablet (15 mg total) by mouth 2 (two) times daily with a meal. Take till 4/3 04/20/13 05/08/13  Brandi Cove, Brandi Rivas  Rivaroxaban (XARELTO) 20 MG TABS tablet Take 1 tablet (20 mg total) by mouth daily with supper. Starting 4/4 05/09/13   Brandi Cove, Brandi Rivas  UNABLE TO FIND This note is to excuse Ms.Miler from work due to medical illness requiring Hospitalization, 3/13 through 3/18 04/20/13   Brandi Cove, Brandi Rivas   BP 131/85 mmHg  Pulse 95  Temp(Src) 99.9 F (37.7 C)  (Oral)  Resp 18  SpO2 96%  LMP 04/06/2014   Physical Exam  Constitutional: She is oriented to person, place, and time. She appears well-developed and well-nourished. No distress.  HENT:  Head: Normocephalic and atraumatic.  Right Ear: External ear normal.  Left Ear: External ear normal.  Mouth/Throat: No oropharyngeal exudate, posterior oropharyngeal edema, posterior oropharyngeal erythema or tonsillar abscesses.  Eyes: Conjunctivae and EOM are normal.  Neck: Neck supple. No tracheal deviation present.  Cardiovascular: Normal rate.   Pulmonary/Chest: Effort normal and breath sounds normal. No respiratory distress. She has no wheezes. She has no rhonchi. She has no rales.  Mild tenderness to palpation of chest wall.  Abdominal: Soft. She exhibits no distension. There is no tenderness.  Musculoskeletal: Normal range of motion.  Mild tenderness to lumbar parspinal muscles, no midline tenderness or associated lower extremity tenderness. no CVA tenderness.   Neurological: She is alert and oriented to person, place, and time.  Skin: Skin is warm and dry.  Psychiatric: She has a normal mood and affect. Her behavior is normal.  Nursing note and vitals reviewed.   ED Course  Procedures (including critical care time) DIAGNOSTIC STUDIES: Oxygen Saturation is 100% on RA, normal by my interpretation.    COORDINATION OF CARE: 6:42 PM-Discussed treatment plan with pt at bedside and pt agreed to plan.    Labs Review Labs Reviewed  URINALYSIS, ROUTINE W REFLEX MICROSCOPIC - Abnormal; Notable for the following:    Ketones, ur 40 (*)    All other components within normal limits  URINE MICROSCOPIC-ADD ON    Imaging Review Dg Chest 2 View  04/13/2014   CLINICAL DATA:  Chronic cough. Chest pain for 2 weeks. Cough and chest pain worsening. Now shortness of breast.  EXAM: CHEST  2 VIEW  COMPARISON:  02/07/2014.  FINDINGS: Chronic scarring in the lung bases. Heart is normal size. No effusions. No  acute infiltrates. Degenerative changes in the thoracic spine.  IMPRESSION: No active cardiopulmonary disease.   Electronically Signed   By: Charlett Nose M.D.   On: 04/13/2014 17:27     EKG Interpretation None      MDM   Final diagnoses:  URI (upper respiratory infection)    Pt has a normal chest xray and unremarkable chem 8. She is sick appearing and has a low grade fever- will rx a Z-pack. She has been given strict return to ER precautions but at this time is stable for discharge with flu/URI symptoms.  54 y.o.Shaquanta D Hinsch's evaluation in the Emergency Department is complete. It has been determined that no acute conditions requiring further emergency intervention are present at this time. The patient/guardian have been advised of the diagnosis and plan. We have discussed signs and symptoms that warrant return to the ED, such as changes or worsening in  symptoms.  Vital signs are stable at discharge. Filed Vitals:   04/13/14 2039  BP:   Pulse: 95  Temp:   Resp: 18    Patient/guardian has voiced understanding and agreed to follow-up with the PCP or specialist.     Brandi Pel, PA-C 04/14/14 1847  Elwin Mocha, Brandi Rivas 04/14/14 2307

## 2014-04-13 NOTE — ED Notes (Signed)
Pt c/o low back pain. Requesting urinalysis

## 2014-04-13 NOTE — ED Notes (Signed)
Pt in by ems from home, c/o cough since November. Has been seen here and at PCP. Has been on tussinex, prednisone. Cough worsened last night, accessory muscles now hurting 2/2 cough.

## 2014-04-13 NOTE — Discharge Instructions (Signed)
Upper Respiratory Infection, Adult An upper respiratory infection (URI) is also sometimes known as the common cold. The upper respiratory tract includes the nose, sinuses, throat, trachea, and bronchi. Bronchi are the airways leading to the lungs. Most people improve within 1 week, but symptoms can last up to 2 weeks. A residual cough may last even longer.  CAUSES Many different viruses can infect the tissues lining the upper respiratory tract. The tissues become irritated and inflamed and often become very moist. Mucus production is also common. A cold is contagious. You can easily spread the virus to others by oral contact. This includes kissing, sharing a glass, coughing, or sneezing. Touching your mouth or nose and then touching a surface, which is then touched by another person, can also spread the virus. SYMPTOMS  Symptoms typically develop 1 to 3 days after you come in contact with a cold virus. Symptoms vary from person to person. They may include:  Runny nose.  Sneezing.  Nasal congestion.  Sinus irritation.  Sore throat.  Loss of voice (laryngitis).  Cough.  Fatigue.  Muscle aches.  Loss of appetite.  Headache.  Low-grade fever. DIAGNOSIS  You might diagnose your own cold based on familiar symptoms, since most people get a cold 2 to 3 times a year. Your caregiver can confirm this based on your exam. Most importantly, your caregiver can check that your symptoms are not due to another disease such as strep throat, sinusitis, pneumonia, asthma, or epiglottitis. Blood tests, throat tests, and X-rays are not necessary to diagnose a common cold, but they may sometimes be helpful in excluding other more serious diseases. Your caregiver will decide if any further tests are required. RISKS AND COMPLICATIONS  You may be at risk for a more severe case of the common cold if you smoke cigarettes, have chronic heart disease (such as heart failure) or lung disease (such as asthma), or if  you have a weakened immune system. The very young and very old are also at risk for more serious infections. Bacterial sinusitis, middle ear infections, and bacterial pneumonia can complicate the common cold. The common cold can worsen asthma and chronic obstructive pulmonary disease (COPD). Sometimes, these complications can require emergency medical care and may be life-threatening. PREVENTION  The best way to protect against getting a cold is to practice good hygiene. Avoid oral or hand contact with people with cold symptoms. Wash your hands often if contact occurs. There is no clear evidence that vitamin C, vitamin E, echinacea, or exercise reduces the chance of developing a cold. However, it is always recommended to get plenty of rest and practice good nutrition. TREATMENT  Treatment is directed at relieving symptoms. There is no cure. Antibiotics are not effective, because the infection is caused by a virus, not by bacteria. Treatment may include:  Increased fluid intake. Sports drinks offer valuable electrolytes, sugars, and fluids.  Breathing heated mist or steam (vaporizer or shower).  Eating chicken soup or other clear broths, and maintaining good nutrition.  Getting plenty of rest.  Using gargles or lozenges for comfort.  Controlling fevers with ibuprofen or acetaminophen as directed by your caregiver.  Increasing usage of your inhaler if you have asthma. Zinc gel and zinc lozenges, taken in the first 24 hours of the common cold, can shorten the duration and lessen the severity of symptoms. Pain medicines may help with fever, muscle aches, and throat pain. A variety of non-prescription medicines are available to treat congestion and runny nose. Your caregiver   can make recommendations and may suggest nasal or lung inhalers for other symptoms.  HOME CARE INSTRUCTIONS   Only take over-the-counter or prescription medicines for pain, discomfort, or fever as directed by your  caregiver.  Use a warm mist humidifier or inhale steam from a shower to increase air moisture. This may keep secretions moist and make it easier to breathe.  Drink enough water and fluids to keep your urine clear or pale yellow.  Rest as needed.  Return to work when your temperature has returned to normal or as your caregiver advises. You may need to stay home longer to avoid infecting others. You can also use a face mask and careful hand washing to prevent spread of the virus. SEEK MEDICAL CARE IF:   After the first few days, you feel you are getting worse rather than better.  You need your caregiver's advice about medicines to control symptoms.  You develop chills, worsening shortness of breath, or brown or red sputum. These may be signs of pneumonia.  You develop yellow or brown nasal discharge or pain in the face, especially when you bend forward. These may be signs of sinusitis.  You develop a fever, swollen neck glands, pain with swallowing, or white areas in the back of your throat. These may be signs of strep throat. SEEK IMMEDIATE MEDICAL CARE IF:   You have a fever.  You develop severe or persistent headache, ear pain, sinus pain, or chest pain.  You develop wheezing, a prolonged cough, cough up blood, or have a change in your usual mucus (if you have chronic lung disease).  You develop sore muscles or a stiff neck. Document Released: 07/18/2000 Document Revised: 04/16/2011 Document Reviewed: 04/29/2013 [employer] Patient Information 2015 [employer]. This information is not intended to replace advice given to you by your health care provider. Make sure you discuss any questions you have with your health care provider.  

## 2014-07-13 ENCOUNTER — Other Ambulatory Visit: Payer: Self-pay

## 2014-07-13 DIAGNOSIS — Z1231 Encounter for screening mammogram for malignant neoplasm of breast: Secondary | ICD-10-CM

## 2014-08-23 ENCOUNTER — Ambulatory Visit
Admission: RE | Admit: 2014-08-23 | Discharge: 2014-08-23 | Disposition: A | Discharge status: Home / Self Care | Payer: 59 | Source: Ambulatory Visit

## 2014-08-23 DIAGNOSIS — Z1231 Encounter for screening mammogram for malignant neoplasm of breast: Secondary | ICD-10-CM

## 2015-03-07 ENCOUNTER — Inpatient Hospital Stay: Admit: 2015-03-07 | Payer: Self-pay | Admitting: Orthopedic Surgery

## 2015-03-07 SURGERY — ARTHROPLASTY, KNEE, TOTAL
Anesthesia: Spinal | Laterality: Left

## 2015-04-12 ENCOUNTER — Other Ambulatory Visit: Payer: Self-pay | Admitting: Orthopedic Surgery

## 2015-05-04 ENCOUNTER — Encounter (HOSPITAL_COMMUNITY): Payer: Self-pay

## 2015-05-04 ENCOUNTER — Encounter (HOSPITAL_COMMUNITY)
Admission: RE | Admit: 2015-05-04 | Discharge: 2015-05-04 | Disposition: A | Discharge status: Home / Self Care | Payer: BLUE CROSS/BLUE SHIELD | Source: Ambulatory Visit | Attending: Orthopedic Surgery | Admitting: Orthopedic Surgery

## 2015-05-04 DIAGNOSIS — J984 Other disorders of lung: Secondary | ICD-10-CM | POA: Insufficient documentation

## 2015-05-04 DIAGNOSIS — Z0183 Encounter for blood typing: Secondary | ICD-10-CM | POA: Insufficient documentation

## 2015-05-04 DIAGNOSIS — Z01812 Encounter for preprocedural laboratory examination: Secondary | ICD-10-CM | POA: Insufficient documentation

## 2015-05-04 DIAGNOSIS — M1712 Unilateral primary osteoarthritis, left knee: Secondary | ICD-10-CM | POA: Insufficient documentation

## 2015-05-04 DIAGNOSIS — Z01818 Encounter for other preprocedural examination: Secondary | ICD-10-CM | POA: Diagnosis not present

## 2015-05-04 HISTORY — DX: Presence of spectacles and contact lenses: Z97.3

## 2015-05-04 HISTORY — DX: Frequency of micturition: R35.0

## 2015-05-04 HISTORY — DX: Personal history of diseases of the blood and blood-forming organs and certain disorders involving the immune mechanism: Z86.2

## 2015-05-04 LAB — BASIC METABOLIC PANEL
Anion gap: 9 (ref 5–15)
BUN: 9 mg/dL (ref 6–20)
CO2: 25 mmol/L (ref 22–32)
Calcium: 9.1 mg/dL (ref 8.9–10.3)
Chloride: 102 mmol/L (ref 101–111)
Creatinine, Ser: 0.75 mg/dL (ref 0.44–1.00)
GFR calc Af Amer: >60 mL/min (ref >60)
GFR calc non Af Amer: >60 mL/min (ref >60)
Glucose, Bld: 88 mg/dL (ref 65–99)
Potassium: 3.7 mmol/L (ref 3.5–5.1)
Sodium: 136 mmol/L (ref 135–145)

## 2015-05-04 LAB — CBC WITH DIFFERENTIAL/PLATELET
Basophils Absolute: 0.1 10*3/uL (ref 0.0–0.1)
Basophils Relative: 2 %
Eosinophils Absolute: 0.1 10*3/uL (ref 0.0–0.7)
Eosinophils Relative: 2 %
HCT: 37.3 % (ref 36.0–46.0)
Hemoglobin: 11.5 g/dL — ABNORMAL LOW (ref 12.0–15.0)
Lymphocytes Relative: 45 %
Lymphs Abs: 2.2 10*3/uL (ref 0.7–4.0)
MCH: 27.4 pg (ref 26.0–34.0)
MCHC: 30.8 g/dL (ref 30.0–36.0)
MCV: 89 fL (ref 78.0–100.0)
Monocytes Absolute: 0.3 10*3/uL (ref 0.1–1.0)
Monocytes Relative: 7 %
Neutro Abs: 2.1 10*3/uL (ref 1.7–7.7)
Neutrophils Relative %: 44 %
Platelets: 194 10*3/uL (ref 150–400)
RBC: 4.19 MIL/uL (ref 3.87–5.11)
RDW: 13.2 % (ref 11.5–15.5)
WBC: 4.8 10*3/uL (ref 4.0–10.5)

## 2015-05-04 LAB — URINALYSIS, ROUTINE W REFLEX MICROSCOPIC
Bilirubin Urine: NEGATIVE
Glucose, UA: NEGATIVE mg/dL
Hgb urine dipstick: NEGATIVE
Ketones, ur: 15 mg/dL — AB
Nitrite: NEGATIVE
Protein, ur: NEGATIVE mg/dL
Specific Gravity, Urine: 1.026 (ref 1.005–1.030)
pH: 5.5 (ref 5.0–8.0)

## 2015-05-04 LAB — APTT: aPTT: 29 seconds (ref 24–37)

## 2015-05-04 LAB — URINE MICROSCOPIC-ADD ON

## 2015-05-04 LAB — SURGICAL PCR SCREEN
MRSA, PCR: NEGATIVE
Staphylococcus aureus: NEGATIVE

## 2015-05-04 LAB — ABO/RH: ABO/RH(D): O POS

## 2015-05-04 LAB — TYPE AND SCREEN
ABO/RH(D): O POS
Antibody Screen: NEGATIVE

## 2015-05-04 LAB — PROTIME-INR
INR: 1.04 (ref 0.00–1.49)
Prothrombin Time: 13.8 seconds (ref 11.6–15.2)

## 2015-05-04 LAB — PREGNANCY, URINE: Preg Test, Ur: NEGATIVE

## 2015-05-04 NOTE — Progress Notes (Signed)
PCP - Dr. Lupe Carneyean Mitchell Cardiologist - denies  EKG - 05/04/15 CXR - 05/04/15  Echo- 2015 Stress test/Cardiac Cath - denies  Patient denies chest pain and shortness of breath at PAT appointment.

## 2015-05-04 NOTE — Pre-Procedure Instructions (Signed)
    Brandi Rivas  05/04/2015      [facility] [address] [place], [place] - [id number] [place] [id number] Phone: [phone] Fax: [phone]    Your procedure is scheduled on Monday, Brandi Rivas, 2017.  [facility] Ad[facility] at 8:00 A.M.   Call this number if you have problems the morning of surgery:  [phone]   Remember:  Do not eat food or drink liquids after midnight.   Take these medicines the morning of surgery with A SIP OF WATER: Albuterol inhaler if needed (please bring with you), Beclomethasone (QVAR) inhaler, Fluticasone (Flonase) if needed, Tramadol (Ultram) if needed.  7 days prior to surgery, stop taking: Aspirin, NSAIDS, Diclofenac Sodium (Voltaren) gel, Naproxen, Aleve, Ibuprofen, Advil, Motrin, BC's, Goody's, Fish oil, all herbal medications, and all vitamins.    Do not wear jewelry, make-up or nail polish.  Do not wear lotions, powders, or perfumes.    Do not shave 48 hours prior to surgery.    Do not bring valuables to the hospital.   [facility] any belongings or valuables.  Contacts, dentures or bridgework may not be worn into surgery.  Leave your suitcase in the car.  After surgery it may be brought to your room.  For patients admitted to the hospital, discharge time will be determined by your treatment team.  Patients discharged the day of surgery will not be allowed to drive home.   Special instructions:  See attached.   Please read over the following fact sheets that you were given. Pain Booklet, Coughing and Deep Breathing, Blood Transfusion Information, Total Joint Packet, MRSA Information and Surgical Site Infection Prevention

## 2015-05-04 NOTE — Progress Notes (Signed)
Casey at Dr. Wadie Lessenowan's notified of bacteria present in urine.

## 2015-05-12 NOTE — H&P (Signed)
TOTAL KNEE ADMISSION H&P  Patient is being admitted for left total knee arthroplasty.  Subjective:  Chief Complaint:left knee pain.  HPI: Brandi Rivas, 55 y.o. female, has a history of pain and functional disability in the left knee due to arthritis and has failed non-surgical conservative treatments for greater than 12 weeks to includeNSAID's and/or analgesics, corticosteriod injections, viscosupplementation injections, flexibility and strengthening excercises and activity modification.  Onset of symptoms was gradual, starting 2 years ago with gradually worsening course since that time. The patient noted prior procedures on the knee to include  arthroplasty on the left knee(s).  Patient currently rates pain in the left knee(s) at 10 out of 10 with activity. Patient has night pain, worsening of pain with activity and weight bearing, pain that interferes with activities of daily living, pain with passive range of motion, crepitus and joint swelling.  Patient has evidence of subchondral sclerosis, joint subluxation and joint space narrowing by imaging studies.   There is no active infection.  Patient Active Problem List   Diagnosis Date Noted  . Pulmonary emboli (HCC) 04/18/2013  . Pulmonary embolus (HCC) 04/17/2013  . Constipation, chronic 12/29/2012  . Hypertension   . Ventral hernia - periumbilical - s/p lap repair 12/09/2012 11/03/2012  . Obesity (BMI 30-39.9) 11/03/2012   Past Medical History  Diagnosis Date  . Hypertension     PREVIOUSLY TOOK MED - BP STABLE - TAKEN OFF MED  . Sinus congestion   . Arthritis   . Ventral hernia   . Pneumonia 01/09/2011  . Acute urinary retention 12/10/2012  . History of anemia     "while pregnant"  . Urinary frequency   . Wears glasses     Past Surgical History  Procedure Laterality Date  . Arthroscopy knee w/ drilling Bilateral   . Ventral hernia repair N/A 12/09/2012    Procedure: LAPAROSCOPIC VENTRAL WALL HERNIA REPAIR;  Surgeon: Ardeth SportsmanSteven C.  Gross, MD;  Location: [facility];  Service: General;  Laterality: N/A;  . Insertion of mesh N/A 12/09/2012    Procedure: INSERTION OF MESH;  Surgeon: Ardeth SportsmanSteven C. Gross, MD;  Location: [facility];  Service: General;  Laterality: N/A;  . Colonoscopy    . Esophagogastroduodenoscopy      No prescriptions prior to admission   Allergies  Allergen Reactions  . Oxycodone Itching and Rash    Social History  Substance Use Topics  . Smoking status: Never Smoker   . Smokeless tobacco: Never Used  . Alcohol Use: No    Family History  Problem Relation Age of Onset  . Hypertension Mother   . Diabetes type II Mother   . Hypertension Father   . Colon cancer Father      Review of Systems  Constitutional: Negative.   HENT:       Sinus issues  Eyes: Negative.   Respiratory: Negative.   Cardiovascular:       Htn  Gastrointestinal: Negative.   Genitourinary: Negative.   Musculoskeletal: Positive for back pain and joint pain.  Skin: Negative.   Neurological: Negative.   Endo/Heme/Allergies: Negative.   Psychiatric/Behavioral: Negative.     Objective:  Physical Exam  Constitutional: She is oriented to person, place, and time. She appears well-developed and well-nourished.  HENT:  Head: Normocephalic and atraumatic.  Eyes: Pupils are equal, round, and reactive to light.  Neck: Normal range of motion. Neck supple.  Cardiovascular: Intact distal pulses.   Respiratory: Effort normal.  Musculoskeletal: She exhibits tenderness.  Bilateral varus deformities  to her knees.  Range of motion today is 5/125 limited by discomfort.    Neurological: She is alert and oriented to person, place, and time.  Skin: Skin is warm and dry.  Psychiatric: She has a normal mood and affect. Her behavior is normal. Judgment and thought content normal.    Vital signs in last 24 hours:    Labs:   Estimated body mass index is 35.21 kg/(m^2) as calculated from the following:   Height as of 04/18/13:  (1.676  m).   Weight as of 04/17/13: 98.9 kg (218 lb 0.6 oz).   Imaging Review Plain radiographs demonstrate bone-on-bone arthritis with lateral subluxation of tibia beneath the femur.   Assessment/Plan:  End stage arthritis, left knee   The patient history, physical examination, clinical judgment of the provider and imaging studies are consistent with end stage degenerative joint disease of the left knee(s) and total knee arthroplasty is deemed medically necessary. The treatment options including medical management, injection therapy arthroscopy and arthroplasty were discussed at length. The risks and benefits of total knee arthroplasty were presented and reviewed. The risks due to aseptic loosening, infection, stiffness, patella tracking problems, thromboembolic complications and other imponderables were discussed. The patient acknowledged the explanation, agreed to proceed with the plan and consent was signed. Patient is being admitted for inpatient treatment for surgery, pain control, PT, OT, prophylactic antibiotics, VTE prophylaxis, progressive ambulation and ADL's and discharge planning. The patient is planning to be discharged home with home health services

## 2015-05-13 MED ORDER — CEFAZOLIN SODIUM-DEXTROSE 2-4 GM/100ML-% IV SOLN
2.0000 g | INTRAVENOUS | Status: AC
Start: 2015-05-16 — End: 2015-05-16
  Administered 2015-05-16: 2 g via INTRAVENOUS
  Filled 2015-05-13: qty 100

## 2015-05-13 MED ORDER — DEXTROSE-NACL 5-0.45 % IV SOLN: INTRAVENOUS | Status: DC

## 2015-05-14 DIAGNOSIS — M1712 Unilateral primary osteoarthritis, left knee: Secondary | ICD-10-CM | POA: Diagnosis present

## 2015-05-16 ENCOUNTER — Inpatient Hospital Stay (HOSPITAL_COMMUNITY): Payer: BLUE CROSS/BLUE SHIELD | Admitting: Anesthesiology

## 2015-05-16 ENCOUNTER — Inpatient Hospital Stay (HOSPITAL_COMMUNITY)
Admission: RE | Admit: 2015-05-16 | Discharge: 2015-05-18 | DRG: 470 | Disposition: A | Discharge status: Skilled Nursing Facility | Payer: BLUE CROSS/BLUE SHIELD | Source: Ambulatory Visit | Attending: Orthopedic Surgery | Admitting: Orthopedic Surgery

## 2015-05-16 ENCOUNTER — Encounter (HOSPITAL_COMMUNITY)
Admission: RE | Disposition: A | Discharge status: Skilled Nursing Facility | Payer: Self-pay | Source: Ambulatory Visit | Attending: Orthopedic Surgery

## 2015-05-16 ENCOUNTER — Encounter (HOSPITAL_COMMUNITY): Payer: Self-pay | Admitting: *Deleted

## 2015-05-16 DIAGNOSIS — D62 Acute posthemorrhagic anemia: Secondary | ICD-10-CM | POA: Diagnosis not present

## 2015-05-16 DIAGNOSIS — Z86711 Personal history of pulmonary embolism: Secondary | ICD-10-CM | POA: Diagnosis not present

## 2015-05-16 DIAGNOSIS — Z96652 Presence of left artificial knee joint: Secondary | ICD-10-CM | POA: Diagnosis not present

## 2015-05-16 DIAGNOSIS — Z471 Aftercare following joint replacement surgery: Secondary | ICD-10-CM | POA: Diagnosis not present

## 2015-05-16 DIAGNOSIS — M25569 Pain in unspecified knee: Secondary | ICD-10-CM | POA: Diagnosis not present

## 2015-05-16 DIAGNOSIS — M6281 Muscle weakness (generalized): Secondary | ICD-10-CM | POA: Diagnosis not present

## 2015-05-16 DIAGNOSIS — I10 Essential (primary) hypertension: Secondary | ICD-10-CM | POA: Diagnosis present

## 2015-05-16 DIAGNOSIS — M1712 Unilateral primary osteoarthritis, left knee: Secondary | ICD-10-CM | POA: Diagnosis not present

## 2015-05-16 DIAGNOSIS — E669 Obesity, unspecified: Secondary | ICD-10-CM | POA: Diagnosis present

## 2015-05-16 DIAGNOSIS — M1710 Unilateral primary osteoarthritis, unspecified knee: Secondary | ICD-10-CM | POA: Diagnosis present

## 2015-05-16 DIAGNOSIS — R262 Difficulty in walking, not elsewhere classified: Secondary | ICD-10-CM | POA: Diagnosis not present

## 2015-05-16 DIAGNOSIS — M179 Osteoarthritis of knee, unspecified: Secondary | ICD-10-CM | POA: Diagnosis not present

## 2015-05-16 DIAGNOSIS — Z885 Allergy status to narcotic agent status: Secondary | ICD-10-CM

## 2015-05-16 DIAGNOSIS — Z6835 Body mass index (BMI) 35.0-35.9, adult: Secondary | ICD-10-CM | POA: Diagnosis not present

## 2015-05-16 HISTORY — PX: TOTAL KNEE ARTHROPLASTY: SHX125

## 2015-05-16 SURGERY — ARTHROPLASTY, KNEE, TOTAL
Anesthesia: Monitor Anesthesia Care | Site: Knee | Laterality: Left

## 2015-05-16 MED ORDER — PROPOFOL 10 MG/ML IV BOLUS
INTRAVENOUS | Status: DC | PRN
  Administered 2015-05-16 (×2): 30 mg via INTRAVENOUS

## 2015-05-16 MED ORDER — SENNOSIDES-DOCUSATE SODIUM 8.6-50 MG PO TABS: 1.0000 | ORAL_TABLET | Freq: Every evening | ORAL | Status: DC | PRN

## 2015-05-16 MED ORDER — METOCLOPRAMIDE HCL 5 MG PO TABS: 5.0000 mg | ORAL_TABLET | Freq: Three times a day (TID) | ORAL | Status: DC | PRN

## 2015-05-16 MED ORDER — HYDROMORPHONE HCL 2 MG PO TABS: 2.0000 mg | ORAL_TABLET | ORAL | Status: DC | PRN

## 2015-05-16 MED ORDER — METHOCARBAMOL 500 MG PO TABS
500.0000 mg | ORAL_TABLET | Freq: Four times a day (QID) | ORAL | Status: DC | PRN
  Administered 2015-05-16 (×2): 500 mg via ORAL
  Filled 2015-05-16: qty 1

## 2015-05-16 MED ORDER — PHENYLEPHRINE HCL 10 MG/ML IJ SOLN
10.0000 mg | INTRAVENOUS | Status: DC | PRN
  Administered 2015-05-16: 10 ug/min via INTRAVENOUS

## 2015-05-16 MED ORDER — MIDAZOLAM HCL 2 MG/2ML IJ SOLN
INTRAMUSCULAR | Status: AC
  Filled 2015-05-16: qty 2

## 2015-05-16 MED ORDER — LACTATED RINGERS IV SOLN
INTRAVENOUS | Status: DC
  Administered 2015-05-16 (×2): via INTRAVENOUS

## 2015-05-16 MED ORDER — SODIUM CHLORIDE 0.9 % IJ SOLN
INTRAMUSCULAR | Status: DC | PRN
  Administered 2015-05-16 (×2): 10 mL

## 2015-05-16 MED ORDER — ONDANSETRON HCL 4 MG/2ML IJ SOLN
INTRAMUSCULAR | Status: AC
  Filled 2015-05-16: qty 2

## 2015-05-16 MED ORDER — HYDROMORPHONE HCL 1 MG/ML IJ SOLN
0.5000 mg | INTRAMUSCULAR | Status: DC | PRN
  Administered 2015-05-16 – 2015-05-17 (×3): 1 mg via INTRAVENOUS
  Filled 2015-05-16 (×3): qty 1

## 2015-05-16 MED ORDER — HYDROMORPHONE HCL 1 MG/ML IJ SOLN
0.2500 mg | INTRAMUSCULAR | Status: DC | PRN
  Administered 2015-05-16: 1 mg via INTRAVENOUS

## 2015-05-16 MED ORDER — BUPIVACAINE LIPOSOME 1.3 % IJ SUSP
20.0000 mL | INTRAMUSCULAR | Status: AC
  Administered 2015-05-16: 20 mL
  Filled 2015-05-16: qty 20

## 2015-05-16 MED ORDER — FLUTICASONE PROPIONATE 50 MCG/ACT NA SUSP: 2.0000 | Freq: Every day | NASAL | Status: DC | PRN

## 2015-05-16 MED ORDER — METHOCARBAMOL 1000 MG/10ML IJ SOLN: 500.0000 mg | Freq: Four times a day (QID) | INTRAVENOUS | Status: DC | PRN

## 2015-05-16 MED ORDER — ALUM & MAG HYDROXIDE-SIMETH 200-200-20 MG/5ML PO SUSP: 30.0000 mL | ORAL | Status: DC | PRN

## 2015-05-16 MED ORDER — PROPOFOL 1000 MG/100ML IV EMUL
INTRAVENOUS | Status: AC
  Filled 2015-05-16: qty 100

## 2015-05-16 MED ORDER — GLYCOPYRROLATE 0.2 MG/ML IJ SOLN
INTRAMUSCULAR | Status: AC
  Filled 2015-05-16: qty 1

## 2015-05-16 MED ORDER — TRANEXAMIC ACID 1000 MG/10ML IV SOLN
2000.0000 mg | Freq: Once | INTRAVENOUS | Status: AC
  Administered 2015-05-16: 2000 mg via TOPICAL
  Filled 2015-05-16: qty 20

## 2015-05-16 MED ORDER — DIPHENHYDRAMINE HCL 12.5 MG/5ML PO ELIX: 12.5000 mg | ORAL_SOLUTION | ORAL | Status: DC | PRN

## 2015-05-16 MED ORDER — ALBUMIN HUMAN 5 % IV SOLN
INTRAVENOUS | Status: DC | PRN
  Administered 2015-05-16: 10:00:00 via INTRAVENOUS

## 2015-05-16 MED ORDER — BISACODYL 5 MG PO TBEC: 5.0000 mg | DELAYED_RELEASE_TABLET | Freq: Every day | ORAL | Status: DC | PRN

## 2015-05-16 MED ORDER — ONDANSETRON HCL 4 MG/2ML IJ SOLN
4.0000 mg | Freq: Four times a day (QID) | INTRAMUSCULAR | Status: DC | PRN
  Administered 2015-05-17 – 2015-05-18 (×2): 4 mg via INTRAVENOUS
  Filled 2015-05-16 (×2): qty 2

## 2015-05-16 MED ORDER — BUPIVACAINE HCL (PF) 0.75 % IJ SOLN
INTRAMUSCULAR | Status: DC | PRN
  Administered 2015-05-16: 1.8 mL via INTRATHECAL

## 2015-05-16 MED ORDER — MIDAZOLAM HCL 2 MG/2ML IJ SOLN
INTRAMUSCULAR | Status: DC | PRN
  Administered 2015-05-16: 2 mg via INTRAVENOUS

## 2015-05-16 MED ORDER — MENTHOL 3 MG MT LOZG: 1.0000 | LOZENGE | OROMUCOSAL | Status: DC | PRN

## 2015-05-16 MED ORDER — HYDROMORPHONE HCL 2 MG PO TABS
2.0000 mg | ORAL_TABLET | ORAL | Status: DC | PRN
  Administered 2015-05-16 (×3): 2 mg via ORAL
  Administered 2015-05-17 (×3): 4 mg via ORAL
  Administered 2015-05-17: 2 mg via ORAL
  Administered 2015-05-18 (×2): 4 mg via ORAL
  Administered 2015-05-18: 2 mg via ORAL
  Filled 2015-05-16: qty 2
  Filled 2015-05-16: qty 1
  Filled 2015-05-16 (×2): qty 2
  Filled 2015-05-16: qty 1
  Filled 2015-05-16: qty 2
  Filled 2015-05-16: qty 1
  Filled 2015-05-16: qty 2
  Filled 2015-05-16: qty 1

## 2015-05-16 MED ORDER — BUPIVACAINE LIPOSOME 1.3 % IJ SUSP: 20.0000 mL | Freq: Once | INTRAMUSCULAR | Status: DC

## 2015-05-16 MED ORDER — ONDANSETRON HCL 4 MG/2ML IJ SOLN
4.0000 mg | Freq: Four times a day (QID) | INTRAMUSCULAR | Status: DC | PRN
Start: 2015-05-16 — End: 2015-05-16

## 2015-05-16 MED ORDER — SODIUM CHLORIDE 0.9 % IR SOLN
Status: DC | PRN
  Administered 2015-05-16: 3000 mL

## 2015-05-16 MED ORDER — METHOCARBAMOL 500 MG PO TABS
ORAL_TABLET | ORAL | Status: AC
  Filled 2015-05-16: qty 1

## 2015-05-16 MED ORDER — ONDANSETRON HCL 4 MG PO TABS
4.0000 mg | ORAL_TABLET | Freq: Four times a day (QID) | ORAL | Status: DC | PRN
  Administered 2015-05-17: 4 mg via ORAL
  Filled 2015-05-16: qty 1

## 2015-05-16 MED ORDER — FLEET ENEMA 7-19 GM/118ML RE ENEM: 1.0000 | ENEMA | Freq: Once | RECTAL | Status: DC | PRN

## 2015-05-16 MED ORDER — LIDOCAINE HCL (CARDIAC) 20 MG/ML IV SOLN
INTRAVENOUS | Status: AC
  Filled 2015-05-16: qty 5

## 2015-05-16 MED ORDER — PHENOL 1.4 % MT LIQD: 1.0000 | OROMUCOSAL | Status: DC | PRN

## 2015-05-16 MED ORDER — METHOCARBAMOL 500 MG PO TABS: 500.0000 mg | ORAL_TABLET | Freq: Two times a day (BID) | ORAL | Status: DC

## 2015-05-16 MED ORDER — PROPOFOL 500 MG/50ML IV EMUL
INTRAVENOUS | Status: DC | PRN
  Administered 2015-05-16: 50 ug/kg/min via INTRAVENOUS

## 2015-05-16 MED ORDER — HYDROMORPHONE HCL 1 MG/ML IJ SOLN
INTRAMUSCULAR | Status: AC
  Filled 2015-05-16: qty 1

## 2015-05-16 MED ORDER — BUDESONIDE 0.25 MG/2ML IN SUSP
0.2500 mg | Freq: Two times a day (BID) | RESPIRATORY_TRACT | Status: DC
  Administered 2015-05-16 – 2015-05-18 (×4): 0.25 mg via RESPIRATORY_TRACT
  Filled 2015-05-16 (×4): qty 2

## 2015-05-16 MED ORDER — APIXABAN 2.5 MG PO TABS
2.5000 mg | ORAL_TABLET | Freq: Two times a day (BID) | ORAL | Status: DC
  Administered 2015-05-17 – 2015-05-18 (×3): 2.5 mg via ORAL
  Filled 2015-05-16 (×3): qty 1

## 2015-05-16 MED ORDER — PROPOFOL 10 MG/ML IV BOLUS
INTRAVENOUS | Status: AC
  Filled 2015-05-16: qty 20

## 2015-05-16 MED ORDER — METOCLOPRAMIDE HCL 5 MG/ML IJ SOLN
5.0000 mg | Freq: Three times a day (TID) | INTRAMUSCULAR | Status: DC | PRN
Start: 2015-05-16 — End: 2015-05-18

## 2015-05-16 MED ORDER — BUPIVACAINE HCL 0.5 % IJ SOLN
INTRAMUSCULAR | Status: DC | PRN
  Administered 2015-05-16: 30 mL

## 2015-05-16 MED ORDER — KCL IN DEXTROSE-NACL 20-5-0.45 MEQ/L-%-% IV SOLN
INTRAVENOUS | Status: DC
  Administered 2015-05-16 – 2015-05-17 (×2): via INTRAVENOUS
  Filled 2015-05-16 (×3): qty 1000

## 2015-05-16 MED ORDER — ACETAMINOPHEN 650 MG RE SUPP: 650.0000 mg | Freq: Four times a day (QID) | RECTAL | Status: DC | PRN

## 2015-05-16 MED ORDER — ACETAMINOPHEN 325 MG PO TABS
650.0000 mg | ORAL_TABLET | Freq: Four times a day (QID) | ORAL | Status: DC | PRN
  Administered 2015-05-16 – 2015-05-17 (×2): 650 mg via ORAL
  Filled 2015-05-16 (×2): qty 2

## 2015-05-16 MED ORDER — CHLORHEXIDINE GLUCONATE 4 % EX LIQD: 60.0000 mL | Freq: Once | CUTANEOUS | Status: DC

## 2015-05-16 MED ORDER — HYDROMORPHONE HCL 2 MG PO TABS
ORAL_TABLET | ORAL | Status: AC
  Filled 2015-05-16: qty 1

## 2015-05-16 MED ORDER — BUPIVACAINE LIPOSOME 1.3 % IJ SUSP: INTRAMUSCULAR | Status: DC | PRN

## 2015-05-16 MED ORDER — DOCUSATE SODIUM 100 MG PO CAPS
100.0000 mg | ORAL_CAPSULE | Freq: Two times a day (BID) | ORAL | Status: DC
  Administered 2015-05-16 – 2015-05-18 (×4): 100 mg via ORAL
  Filled 2015-05-16 (×4): qty 1

## 2015-05-16 MED ORDER — CEFUROXIME SODIUM 1.5 G IJ SOLR
INTRAMUSCULAR | Status: AC
  Filled 2015-05-16: qty 1.5

## 2015-05-16 MED ORDER — ALBUTEROL SULFATE (2.5 MG/3ML) 0.083% IN NEBU: 2.5000 mg | INHALATION_SOLUTION | Freq: Four times a day (QID) | RESPIRATORY_TRACT | Status: DC | PRN

## 2015-05-16 MED ORDER — BUPIVACAINE HCL (PF) 0.5 % IJ SOLN
INTRAMUSCULAR | Status: AC
  Filled 2015-05-16: qty 30

## 2015-05-16 MED ORDER — ASPIRIN EC 325 MG PO TBEC: 325.0000 mg | DELAYED_RELEASE_TABLET | Freq: Two times a day (BID) | ORAL | Status: DC

## 2015-05-16 MED ORDER — EPHEDRINE SULFATE 50 MG/ML IJ SOLN
INTRAMUSCULAR | Status: DC | PRN
  Administered 2015-05-16 (×4): 5 mg via INTRAVENOUS

## 2015-05-16 SURGICAL SUPPLY — 63 items
BANDAGE ESMARK 6X9 LF (GAUZE/BANDAGES/DRESSINGS) ×1 IMPLANT
BLADE SAG 18X100X1.27 (BLADE) ×4 IMPLANT
BLADE SAGITTAL (BLADE)
BLADE SAW SGTL 13X75X1.27 (BLADE) ×2 IMPLANT
BLADE SAW THK1.47X90X25X (BLADE) IMPLANT
BLADE SURG ROTATE 9660 (MISCELLANEOUS) IMPLANT
BNDG ELASTIC 6X10 VLCR STRL LF (GAUZE/BANDAGES/DRESSINGS) ×2 IMPLANT
BNDG ESMARK 6X9 LF (GAUZE/BANDAGES/DRESSINGS) ×2
BOWL SMART MIX CTS (DISPOSABLE) ×2 IMPLANT
CAPT KNEE TOTAL 3 ATTUNE ×2 IMPLANT
CEMENT HV SMART SET (Cement) ×4 IMPLANT
COVER SURGICAL LIGHT HANDLE (MISCELLANEOUS) ×2 IMPLANT
CUFF TOURNIQUET SINGLE 34IN LL (TOURNIQUET CUFF) ×2 IMPLANT
DECANTER SPIKE VIAL GLASS SM (MISCELLANEOUS) ×2 IMPLANT
DRAPE EXTREMITY T 121X128X90 (DRAPE) ×2 IMPLANT
DRAPE PROXIMA HALF (DRAPES) ×2 IMPLANT
DRAPE U-SHAPE 47X51 STRL (DRAPES) ×2 IMPLANT
DRSG AQUACEL AG ADV 3.5X10 (GAUZE/BANDAGES/DRESSINGS) ×2 IMPLANT
DURAPREP 26ML APPLICATOR (WOUND CARE) ×4 IMPLANT
ELECT CAUTERY BLADE 6.4 (BLADE) ×2 IMPLANT
ELECT REM PT RETURN 9FT ADLT (ELECTROSURGICAL) ×2
ELECTRODE REM PT RTRN 9FT ADLT (ELECTROSURGICAL) ×1 IMPLANT
GLOVE BIO SURGEON STRL SZ 6.5 (GLOVE) ×2 IMPLANT
GLOVE BIO SURGEON STRL SZ7.5 (GLOVE) ×2 IMPLANT
GLOVE BIO SURGEON STRL SZ8.5 (GLOVE) ×2 IMPLANT
GLOVE BIOGEL PI IND STRL 6 (GLOVE) ×1 IMPLANT
GLOVE BIOGEL PI IND STRL 7.0 (GLOVE) ×1 IMPLANT
GLOVE BIOGEL PI IND STRL 7.5 (GLOVE) ×1 IMPLANT
GLOVE BIOGEL PI IND STRL 8 (GLOVE) ×1 IMPLANT
GLOVE BIOGEL PI IND STRL 9 (GLOVE) ×2 IMPLANT
GLOVE BIOGEL PI INDICATOR 6 (GLOVE) ×1
GLOVE BIOGEL PI INDICATOR 7.0 (GLOVE) ×1
GLOVE BIOGEL PI INDICATOR 7.5 (GLOVE) ×1
GLOVE BIOGEL PI INDICATOR 8 (GLOVE) ×1
GLOVE BIOGEL PI INDICATOR 9 (GLOVE) ×2
GOWN STRL REIN XL XLG (GOWN DISPOSABLE) ×4 IMPLANT
GOWN STRL REUS W/ TWL LRG LVL3 (GOWN DISPOSABLE) ×3 IMPLANT
GOWN STRL REUS W/ TWL XL LVL3 (GOWN DISPOSABLE) ×2 IMPLANT
GOWN STRL REUS W/TWL LRG LVL3 (GOWN DISPOSABLE) ×6
GOWN STRL REUS W/TWL XL LVL3 (GOWN DISPOSABLE) ×4
HANDPIECE INTERPULSE COAX TIP (DISPOSABLE) ×1
HOOD PEEL AWAY FACE SHEILD DIS (HOOD) ×6 IMPLANT
KIT BASIN OR (CUSTOM PROCEDURE TRAY) ×2 IMPLANT
KIT ROOM TURNOVER OR (KITS) ×2 IMPLANT
MANIFOLD NEPTUNE II (INSTRUMENTS) ×2 IMPLANT
NEEDLE SPNL 18GX3.5 QUINCKE PK (NEEDLE) ×2 IMPLANT
NS IRRIG 1000ML POUR BTL (IV SOLUTION) ×2 IMPLANT
PACK TOTAL JOINT (CUSTOM PROCEDURE TRAY) ×2 IMPLANT
PAD ARMBOARD 7.5X6 YLW CONV (MISCELLANEOUS) ×2 IMPLANT
SET HNDPC FAN SPRY TIP SCT (DISPOSABLE) ×1 IMPLANT
SPONGE LAP 18X18 X RAY DECT (DISPOSABLE) ×4 IMPLANT
SUT VIC AB 0 CT1 27 (SUTURE) ×1
SUT VIC AB 0 CT1 27XBRD ANBCTR (SUTURE) ×1 IMPLANT
SUT VIC AB 1 CTX 36 (SUTURE) ×2
SUT VIC AB 1 CTX36XBRD ANBCTR (SUTURE) ×1 IMPLANT
SUT VIC AB 2-0 CT1 27 (SUTURE) ×1
SUT VIC AB 2-0 CT1 TAPERPNT 27 (SUTURE) ×1 IMPLANT
SUT VIC AB 3-0 CT1 27 (SUTURE) ×1
SUT VIC AB 3-0 CT1 TAPERPNT 27 (SUTURE) ×1 IMPLANT
SYR 50ML LL SCALE MARK (SYRINGE) ×2 IMPLANT
TOWEL OR 17X24 6PK STRL BLUE (TOWEL DISPOSABLE) ×2 IMPLANT
TOWEL OR 17X26 10 PK STRL BLUE (TOWEL DISPOSABLE) ×2 IMPLANT
WATER STERILE IRR 1000ML POUR (IV SOLUTION) ×6 IMPLANT

## 2015-05-16 NOTE — Transfer of Care (Signed)
Immediate Anesthesia Transfer of Care Note  Patient: Brandi Rivas  Procedure(s) Performed: Procedure(s): TOTAL KNEE ARTHROPLASTY (Left)  Patient Location: PACU  Anesthesia Type:Spinal  Level of Consciousness: awake, alert , oriented and patient cooperative  Airway & Oxygen Therapy: Patient Spontanous Breathing and Patient connected to face mask oxygen  Post-op Assessment: Report given to RN and Post -op Vital signs reviewed and stable  Post vital signs: Reviewed and stable  Last Vitals:  Filed Vitals:   05/16/15 0717  BP: 124/87  Pulse: 72  Temp: 36.6 C  Resp: 20    Complications: No apparent anesthesia complications

## 2015-05-16 NOTE — Anesthesia Preprocedure Evaluation (Signed)
Anesthesia Evaluation  Patient identified by MRN, date of birth, ID band Patient awake    Reviewed: Allergy & Precautions, NPO status , Patient's Chart, lab work & pertinent test results  Airway Mallampati: II   Neck ROM: full    Dental   Pulmonary pneumonia,    breath sounds clear to auscultation       Cardiovascular hypertension,  Rhythm:regular Rate:Normal     Neuro/Psych    GI/Hepatic   Endo/Other  obese  Renal/GU      Musculoskeletal  (+) Arthritis ,   Abdominal   Peds  Hematology   Anesthesia Other Findings   Reproductive/Obstetrics                             Anesthesia Physical Anesthesia Plan  ASA: II  Anesthesia Plan: MAC and Spinal   Post-op Pain Management:    Induction: Intravenous  Airway Management Planned: Simple Face Mask  Additional Equipment:   Intra-op Plan:   Post-operative Plan:   Informed Consent: I have reviewed the patients History and Physical, chart, labs and discussed the procedure including the risks, benefits and alternatives for the proposed anesthesia with the patient or authorized representative who has indicated his/her understanding and acceptance.     Plan Discussed with: CRNA, Anesthesiologist and Surgeon  Anesthesia Plan Comments:         Anesthesia Quick Evaluation

## 2015-05-16 NOTE — Anesthesia Procedure Notes (Signed)
Spinal Patient location during procedure: OR Start time: 05/16/2015 9:02 AM End time: 05/16/2015 9:09 AM Staffing Anesthesiologist: HODIERNE, ADAM Performed by: anesthesiologist  Preanesthetic Checklist Completed: patient identified, site marked, surgical consent, pre-op evaluation, timeout performed, IV checked, risks and benefits discussed and monitors and equipment checked Spinal Block Patient position: sitting Prep: Betadine Patient monitoring: heart rate, cardiac monitor, continuous pulse ox and blood pressure Approach: midline Location: L3-4 Injection technique: single-shot Needle Needle type: Pencan  Needle gauge: 24 G Needle length: 9 cm Assessment Sensory level: T10 Additional Notes Pt tolerated the procedure well.

## 2015-05-16 NOTE — Interval H&P Note (Signed)
History and Physical Interval Note:  05/16/2015 7:15 AM  Brandi Rivas  has presented today for surgery, with the diagnosis of LEFT KNEE OSTEOARTHRITIS  The various methods of treatment have been discussed with the patient and family. After consideration of risks, benefits and other options for treatment, the patient has consented to  Procedure(s): TOTAL KNEE ARTHROPLASTY (Left) as a surgical intervention .  The patient's history has been reviewed, patient examined, no change in status, stable for surgery.  I have reviewed the patient's chart and labs.  Questions were answered to the patient's satisfaction.     Nestor LewandowskyOWAN,[name]

## 2015-05-16 NOTE — Progress Notes (Signed)
Orthopedic Tech Progress Note Patient Details:  Brandi Rivas [birth year] [id number]  CPM Left Knee CPM Left Knee: On Left Knee Flexion (Degrees): 40 Left Knee Extension (Degrees): 0 Additional Comments: trapeze bar patient helper viewed order from doctor's order list  Nikki DomCrawford, Raun Routh 05/16/2015, 12:37 PM

## 2015-05-16 NOTE — Discharge Instructions (Addendum)

## 2015-05-16 NOTE — Op Note (Signed)
PATIENT ID:      Brandi Rivas  MRN:     [id number] DOB/AGE:    1960-07-19 / 55 y.o.       OPERATIVE REPORT    DATE OF PROCEDURE:  05/16/2015       PREOPERATIVE DIAGNOSIS:   LEFT KNEE OSTEOARTHRITIS      Estimated body mass index is 35.85 kg/(m^2) as calculated from the following:   Height as of this encounter: 5\' 6"  (1.676 m).   Weight as of this encounter: 100.699 kg (222 lb).                                                        POSTOPERATIVE DIAGNOSIS:   LEFT KNEE OSTEOARTHRITIS                                                                      PROCEDURE:  Procedure(s): TOTAL KNEE ARTHROPLASTY Using DepuyAttune RP implants #7L Femur, #8Tibia, 6 mm Attune RP bearing, 38 Patella     SURGEON: Hill Mackie J    ASSISTANT:   Eric K. [employer] PA-C   (Present and scrubbed throughout the case, critical for assistance with exposure, retraction, instrumentation, and closure.)         ANESTHESIA: Spinal, 20cc Exparel, 20cc 0.5% Marcaine  EBL: 500  FLUID REPLACEMENT: 1500 crystalloid  TOURNIQUET TIME: 15min  Drains: None  Tranexamic Acid: 2gm topical   COMPLICATIONS:  None         INDICATIONS FOR PROCEDURE: The patient has  LEFT KNEE OSTEOARTHRITIS, Var deformities, XR shows bone on bone arthritis, lateral subluxation of tibia. Patient has failed all conservative measures including anti-inflammatory medicines, narcotics, attempts at  exercise and weight loss, cortisone injections and viscosupplementation.  Risks and benefits of surgery have been discussed, questions answered.   DESCRIPTION OF PROCEDURE: The patient identified by armband, received  IV antibiotics, in [facility] holding [facility]. Patient taken to the operating room, appropriate anesthetic  monitors were attached, and Spinal anesthesia was  induced. Tourniquet  applied high to the operative thigh. Lateral post and foot positioner  applied to the table, the lower extremity was then prepped and draped  in usual  sterile fashion from the toes to the tourniquet. Time-out procedure was performed. We began the operation, with the knee flexed 120 degrees, by making the anterior midline incision starting at handbreadth above the patella going over the patella 1 cm medial to and 4 cm distal to the tibial tubercle. Small bleeders in the skin and the  subcutaneous tissue identified and cauterized. Transverse retinaculum was incised and reflected medially and a medial parapatellar arthrotomy was accomplished. the patella was everted and theprepatellar fat pad resected. The superficial medial collateral  ligament was then elevated from anterior to posterior along the proximal  flare of the tibia and anterior half of the menisci resected. The knee was hyperflexed exposing bone on bone arthritis. Peripheral and notch osteophytes as well as the cruciate ligaments were then resected. We continued to  work our way around posteriorly along the proximal tibia, and  externally  rotated the tibia subluxing it out from underneath the femur. A McHale  retractor was placed through the notch and a lateral Hohmann retractor  placed, and we then drilled through the proximal tibia in line with the  axis of the tibia followed by an intramedullary guide rod and 2-degree  posterior slope cutting guide. The tibial cutting guide, 3 degree posterior sloped, was pinned into place allowing resection of 3 mm of bone medially and 11 mm of bone laterally. Satisfied with the tibial resection, we then  entered the distal femur 2 mm anterior to the PCL origin with the  intramedullary guide rod and applied the distal femoral cutting guide  set at 9 mm, with 5 degrees of valgus. This was pinned along the  epicondylar axis. At this point, the distal femoral cut was accomplished without difficulty. We then sized for a #7L femoral component and pinned the guide in 3 degrees of external rotation. The chamfer cutting guide was pinned into place. The anterior,  posterior, and chamfer cuts were accomplished without difficulty followed by  the Attune RP box cutting guide and the box cut. We also removed posterior osteophytes from the posterior femoral condyles. At this  time, the knee was brought into full extension. We checked our  extension and flexion gaps and found them symmetric for a 6 mm bearing. Distracting in extension with a lamina spreader, the posterior horns of the menisci were removed, and Exparel, diluted to 60 cc, with 20cc NS, and 20cc 0.5% Marcaine,was injected into the capsule and synovium of the knee. The posterior patella cut was accomplished with the 9.5 mm Attune cutting guide, sized for a 38mm dome, and the fixation pegs drilled.The knee  was then once again hyperflexed exposing the proximal tibia. We sized for a # 8 tibial base plate, applied the smokestack and the conical reamer followed by the the Delta fin keel punch. We then hammered into place the Attune RP trial femoral component, drilled the lugs, inserted a  6 mm trial bearing, trial patellar button, and took the knee through range of motion from 0-130 degrees. No thumb pressure was required for patellar Tracking. At this point, the limb was wrapped with an Esmarch bandage and the tourniquet inflated to 350 mmHg. All trial components were removed, mating surfaces irrigated with pulse lavage, and dried with suction and sponges. A double batch of DePuy HV cement with 1500 mg of Zinacef was mixed and applied to all bony metallic mating surfaces except for the posterior condyles of the femur itself. In order, we  hammered into place the tibial tray and removed excess cement, the femoral component and removed excess cement. The final Attune RP bearing  was inserted, and the knee brought to full extension with compression.  The patellar button was clamped into place, and excess cement  removed. While the cement cured the wound was irrigated out with normal saline solution pulse lavage.  Ligament stability and patellar tracking were checked and found to be excellent. The parapatellar arthrotomy was closed with  running #1 Vicryl suture. The subcutaneous tissue with 0 and 2-0 undyed  Vicryl suture, and the skin with running 3-0 SQ vicryl. A dressing of Xeroform,  4 x 4, dressing sponges, Webril, and Ace wrap applied. The patient  awakened, and taken to recovery room without difficulty.   Rucker Pridgeon J 05/16/2015, 10:59 AM

## 2015-05-17 ENCOUNTER — Encounter (HOSPITAL_COMMUNITY): Payer: Self-pay | Admitting: Orthopedic Surgery

## 2015-05-17 LAB — CBC
HCT: 29.2 % — ABNORMAL LOW (ref 36.0–46.0)
Hemoglobin: 8.8 g/dL — ABNORMAL LOW (ref 12.0–15.0)
MCH: 26.9 pg (ref 26.0–34.0)
MCHC: 30.1 g/dL (ref 30.0–36.0)
MCV: 89.3 fL (ref 78.0–100.0)
Platelets: 149 10*3/uL — ABNORMAL LOW (ref 150–400)
RBC: 3.27 MIL/uL — ABNORMAL LOW (ref 3.87–5.11)
RDW: 13.6 % (ref 11.5–15.5)
WBC: 7.6 10*3/uL (ref 4.0–10.5)

## 2015-05-17 LAB — BASIC METABOLIC PANEL
Anion gap: 7 (ref 5–15)
BUN: 8 mg/dL (ref 6–20)
CO2: 23 mmol/L (ref 22–32)
Calcium: 8.3 mg/dL — ABNORMAL LOW (ref 8.9–10.3)
Chloride: 103 mmol/L (ref 101–111)
Creatinine, Ser: 0.78 mg/dL (ref 0.44–1.00)
GFR calc Af Amer: >60 mL/min (ref >60)
GFR calc non Af Amer: >60 mL/min (ref >60)
Glucose, Bld: 138 mg/dL — ABNORMAL HIGH (ref 65–99)
Potassium: 4 mmol/L (ref 3.5–5.1)
Sodium: 133 mmol/L — ABNORMAL LOW (ref 135–145)

## 2015-05-17 NOTE — Anesthesia Postprocedure Evaluation (Signed)
Anesthesia Post Note  Patient: Brandi Rivas  Procedure(s) Performed: Procedure(s) (LRB): TOTAL KNEE ARTHROPLASTY (Left)  Patient location during evaluation: PACU Anesthesia Type: Spinal Level of consciousness: oriented and awake and alert Pain management: pain level controlled Vital Signs Assessment: post-procedure vital signs reviewed and stable Respiratory status: spontaneous breathing, respiratory function stable and patient connected to nasal cannula oxygen Cardiovascular status: blood pressure returned to baseline and stable Postop Assessment: no headache and no backache Anesthetic complications: no    Last Vitals:  Filed Vitals:   05/16/15 2239 05/17/15 0555  BP: 106/73 115/75  Pulse: 93 89  Temp: 37.2 C 36.6 C  Resp: 18 18    Last Pain:  Filed Vitals:   05/17/15 1051  PainSc: 5                  Meghan Tiemann S

## 2015-05-17 NOTE — Clinical Social Work Placement (Addendum)
   CLINICAL SOCIAL WORK PLACEMENT  NOTE  Date:  05/17/2015  Patient Details  Name: Brandi Rivas MRN: [id number] Date of Birth: 1960/08/01  Clinical Social Work is seeking post-discharge placement for this patient at the Skilled  Nursing Facility level of care (*CSW will initial, date and re-position this form in  chart as items are completed):  Yes   Patient/family provided with [facility] Clinical Social Work Department's list of facilities offering this level of care within the geographic area requested by the patient (or if unable, by the patient's family).  Yes   Patient/family informed of their freedom to choose among providers that offer the needed level of care, that participate in Medicare, Medicaid or managed care program needed by the patient, have an available bed and are willing to accept the patient.  Yes   Patient/family informed of [facility]'s ownership interest in [facility] and [facility], as well as of the fact that they are under no obligation to receive care at these facilities.  PASRR submitted to EDS on 05/17/15     PASRR number received on 05/17/15     Existing PASRR number confirmed on       FL2 transmitted to all facilities in geographic area requested by pt/family on 05/17/15     FL2 transmitted to all facilities within larger geographic area on       Patient informed that his/her managed care company has contracts with or will negotiate with certain facilities, including the following:            Patient/family informed of bed offers received.  [facility] (updated Windell MouldingEric Chayden Garrelts, MSW, LCSWA, 05/18/15)     Physician recommends and patient chooses bed at      [facility]  [facility]  05[facility]15 (updated Windell MouldingEric Sunshyne Horvath, MSW, LCSWA, 05/18/15).  Patient to be transferred to facility by  [facility] (updated Windell MouldingEric Crandall Harvel, MSW, LCSWA, 05/18/15)     Patient family notified on  05/18/15 of transfer.  (updated Windell MouldingEric Toriana Sponsel, MSW, LCSWA, 05/18/15)  Name of family member notified:    Patient notified her family (updated Windell Mouldingric Olusegun Gerstenberger, MSW, LCSWA, 05/18/15)     PHYSICIAN       Additional Comment:    _______________________________________________ Rondel BatonIngle, ReGina C, LCSW 05/17/2015, 11:40 AM   Ervin KnackEric R. Hassan Rowannterhaus, MSW, Theresia MajorsLCSWA [phone] 05/18/2015 2:09 PM (updated Windell MouldingEric Chailyn Racette, MSW, SpurLCSWA, 05/18/15)

## 2015-05-17 NOTE — Care Management Note (Signed)
Case Management Note  Patient Details  Name: Brandi Rivas MRN: [id number] Date of Birth: 1960/02/22  Subjective/Objective:       S/p left total knee arthroplasty             Action/Plan: PT recommending SNF. Referral made to CSW, CSW working on SNF placement for short term rehab. Patient was set up with [facility] for HHPT by MD office, notified Hilda LiasMarie at [facility] of change in discharge plan. Will continue to follow.  Expected Discharge Date:                  Expected Discharge Plan:  Skilled Nursing Facility  In-House Referral:  Clinical Social Work  Discharge planning Services  CM Consult  Post Acute Care Choice:  NA Choice offered to:     DME Arranged:    DME Agency:     HH Arranged:    HH Agency:     Status of Service:  In process, will continue to follow  Medicare Important Message Given:    Date Medicare IM Given:    Medicare IM give by:    Date Additional Medicare IM Given:    Additional Medicare Important Message give by:     If discussed at Long Length of Stay Meetings, dates discussed:    Additional Comments:  Monica BectonKrieg, Erubiel Manasco Watson, RN 05/17/2015, 12:29 PM

## 2015-05-17 NOTE — [place] FL2 (Signed)
[place] MEDICAID FL2 LEVEL OF CARE SCREENING TOOL     IDENTIFICATION  Patient Name: Brandi Rivas Birthdate: [birth year] Sex: female Admission Date (Current Location): 05/16/2015  [facility] and [place] Number:  Producer, television/film/video and Address:  [facility] [place], [address], [place], [place] [address]      Provider Number: [id number]  Attending Physician Name and Address:  Gean Birchwood, MD  Relative Name and Phone Number:       Current Level of Care: Hospital Recommended Level of Care: Skilled Nursing Facility Prior Approval Number:    Date Approved/Denied:   PASRR Number: [id number]  Discharge Plan: SNF    Current Diagnoses: Patient Active Problem List   Diagnosis Date Noted  . Primary osteoarthritis of knee 05/16/2015  . Primary osteoarthritis of left knee 05/14/2015  . Pulmonary emboli (HCC) 04/18/2013  . Pulmonary embolus (HCC) 04/17/2013  . Constipation, chronic 12/29/2012  . Hypertension   . Ventral hernia - periumbilical - s/p lap repair 12/09/2012 11/03/2012  . Obesity (BMI 30-39.9) 11/03/2012    Orientation RESPIRATION BLADDER Height & Weight     Self, Time, Situation, Place  Normal Continent Weight: 222 lb (100.699 kg) Height:   (167.6 cm)  BEHAVIORAL SYMPTOMS/MOOD NEUROLOGICAL BOWEL NUTRITION STATUS      Continent    AMBULATORY STATUS COMMUNICATION OF NEEDS Skin   Limited Assist Verbally Surgical wounds                       Personal Care Assistance Level of Assistance  Dressing, Bathing Bathing Assistance: Limited assistance   Dressing Assistance: Limited assistance     Functional Limitations Info             SPECIAL CARE FACTORS FREQUENCY  PT (By licensed PT), OT (By licensed OT)     PT Frequency: daily OT Frequency: daily            Contractures Contractures Info: Not present    Additional Factors Info  Allergies   Allergies Info: oxycodone           Current Medications  (05/17/2015):  This is the current hospital active medication list Current Facility-Administered Medications  Medication Dose Route Frequency Provider Last Rate Last Dose  . acetaminophen (TYLENOL) tablet 650 mg  650 mg Oral Q6H PRN Gean Birchwood, MD   650 mg at 05/17/15 0524   Or  . acetaminophen (TYLENOL) suppository 650 mg  650 mg Rectal Q6H PRN Gean Birchwood, MD      . albuterol (PROVENTIL) (2.5 MG/3ML) 0.083% nebulizer solution 2.5 mg  2.5 mg Inhalation Q6H PRN Gean Birchwood, MD      . alum & mag hydroxide-simeth (MAALOX/MYLANTA) 200-200-20 MG/5ML suspension 30 mL  30 mL Oral Q4H PRN Gean Birchwood, MD      . apixaban Everlene Balls) tablet 2.5 mg  2.5 mg Oral Q12H Gean Birchwood, MD   2.5 mg at 05/17/15 0919  . bisacodyl (DULCOLAX) EC tablet 5 mg  5 mg Oral Daily PRN Gean Birchwood, MD      . budesonide (PULMICORT) nebulizer solution 0.25 mg  0.25 mg Nebulization BID Gean Birchwood, MD   0.25 mg at 05/17/15 0815  . dextrose 5 % and 0.45 % NaCl with KCl 20 mEq/L infusion   Intravenous Continuous Gean Birchwood, MD 125 mL/hr at 05/17/15 0105    . diphenhydrAMINE (BENADRYL) 12.5 MG/5ML elixir 12.5-25 mg  12.5-25 mg Oral Q4H PRN Gean Birchwood, MD      .  docusate sodium (COLACE) capsule 100 mg  100 mg Oral BID Gean BirchwoodFrank Rowan, MD   100 mg at 05/17/15 0919  . fluticasone (FLONASE) 50 MCG/ACT nasal spray 2 spray  2 spray Each Nare Daily PRN Gean BirchwoodFrank Rowan, MD      . HYDROmorphone (DILAUDID) injection 0.5-1 mg  0.5-1 mg Intravenous Q2H PRN Gean BirchwoodFrank Rowan, MD   1 mg at 05/17/15 0525  . HYDROmorphone (DILAUDID) tablet 2-4 mg  2-4 mg Oral Q3H PRN Gean BirchwoodFrank Rowan, MD   2 mg at 05/17/15 0957  . menthol-cetylpyridinium (CEPACOL) lozenge 3 mg  1 lozenge Oral PRN Gean BirchwoodFrank Rowan, MD       Or  . phenol (CHLORASEPTIC) mouth spray 1 spray  1 spray Mouth/Throat PRN Gean BirchwoodFrank Rowan, MD      . methocarbamol (ROBAXIN) tablet 500 mg  500 mg Oral Q6H PRN Gean BirchwoodFrank Rowan, MD   500 mg at 05/16/15 2312   Or  . methocarbamol (ROBAXIN) 500 mg in dextrose 5 % 50 mL IVPB   500 mg Intravenous Q6H PRN Gean BirchwoodFrank Rowan, MD      . metoCLOPramide (REGLAN) tablet 5-10 mg  5-10 mg Oral Q8H PRN Gean BirchwoodFrank Rowan, MD       Or  . metoCLOPramide (REGLAN) injection 5-10 mg  5-10 mg Intravenous Q8H PRN Gean BirchwoodFrank Rowan, MD      . ondansetron [facility](ZOFRAN) tablet 4 mg  4 mg Oral Q6H PRN Gean BirchwoodFrank Rowan, MD       Or  . ondansetron [facility](ZOFRAN) injection 4 mg  4 mg Intravenous Q6H PRN Gean BirchwoodFrank Rowan, MD      . senna-docusate (Senokot-S) tablet 1 tablet  1 tablet Oral QHS PRN Gean BirchwoodFrank Rowan, MD      . sodium phosphate (FLEET) 7-19 GM/118ML enema 1 enema  1 enema Rectal Once PRN Gean BirchwoodFrank Rowan, MD         Discharge Medications: Please see discharge summary for a list of discharge medications.  Relevant Imaging Results:  Relevant Lab Results:   Additional Information SSN: [id number]  Rondel Batonngle, Dakari Cregger C, LCSW

## 2015-05-17 NOTE — Progress Notes (Signed)
OT Cancellation Note  Patient Details Name: Brandi Rivas MRN: [id number] DOB: 1960-03-26   Cancelled Treatment:    Reason Eval/Treat Not Completed:  (OT screened) Pt's current D/C plan is SNF. No apparent immediate acute care OT needs, therefore will defer OT to SNF. If OT eval is needed please call Acute Rehab Dept. at [phone] or text page OT at [phone].    Earlie RavelingStraub, Acy Orsak L OTR/L [phone] 05/17/2015, 10:15 AM

## 2015-05-17 NOTE — Progress Notes (Signed)
Physical Therapy Treatment Patient Details Name: Brandi Rivas MRN: [id number] DOB: [birth year] Today's Date: 05/17/2015    History of Present Illness  Admitted for Left TKA, WBAT; has a past medical history of Hypertension; Arthritis; PNA; bilateral arthroscopy on knees; Acute urinary retention (12/10/2012); History of anemia; Urinary frequency; Wears glasses and pt obese.    PT Comments    Able to walk to the bathroom, though still quite painful; Continue to recommend SNF for post-acute therapy needs.   Follow Up Recommendations  SNF     Equipment Recommendations  Rolling walker with 5" wheels;3in1 (PT)    Recommendations for Other Services       Precautions / Restrictions Precautions Precautions: Knee;Fall Precaution Booklet Issued: No Precaution Comments: educated on knee precautions Restrictions Weight Bearing Restrictions: Yes LLE Weight Bearing: Weight bearing as tolerated    Mobility  Bed Mobility               General bed mobility comments: not assessed  Transfers Overall transfer level: Needs assistance Equipment used: Rolling walker (2 wheeled) Transfers: Sit to/from Stand Sit to Stand: Min guard Stand pivot transfers: Min guard (pivotal steps)       General transfer comment: cues for technique.  Ambulation/Gait Ambulation/Gait assistance: Min assist Ambulation Distance (Feet): 20 Feet Assistive device: Rolling walker (2 wheeled) Gait Pattern/deviations: Step-to pattern;Decreased stance time - left;Decreased step length - right     General Gait Details: Heavy dependence on RW; cues for sequence and to activate L quad for stance stability; very painful   Stairs            Wheelchair Mobility    Modified Rankin (Stroke Patients Only)       Balance             Standing balance-Leahy Scale: Poor                      Cognition Arousal/Alertness: Awake/alert Behavior During Therapy: WFL for tasks  assessed/performed;Flat affect Overall Cognitive Status: Within Functional Limits for tasks assessed                      Exercises  Quad sets L knee; 10 reps Straight Leg Raise; Left; 5 reps  Unable to tolerate further therex due to pain    General Comments        Pertinent Vitals/Pain Pain Assessment: 0-10 Pain Score: 10-Worst pain ever Pain Location: L knee Pain Descriptors / Indicators: Aching Pain Intervention(s): Monitored during session;Repositioned    Home Living Family/patient expects to be discharged to:: Skilled nursing facility Living Arrangements: Children             Additional Comments: Children work during the day; she must be completely modified independent to d/c home    Prior Function Level of Independence: Independent          PT Goals (current goals can now be found in the care plan section) Acute Rehab PT Goals Patient Stated Goal: get better PT Goal Formulation: With patient Time For Goal Achievement: 05/24/15 Potential to Achieve Goals: Good Progress towards PT goals: Progressing toward goals    Frequency  7X/week    PT Plan Current plan remains appropriate    Co-evaluation             End of Session Equipment Utilized During Treatment: Gait belt Activity Tolerance: Patient limited by pain Patient left: in chair;with call bell/phone within reach     Time: 6578-4696  PT Time Calculation (min) (ACUTE ONLY): 28 min  Charges:  $Gait Training: 8-22 mins $Therapeutic Activity: 8-22 mins                    G Codes:      Olen PelGarrigan, Nailah Luepke Hamff 05/17/2015, 4:21 PM  Van ClinesHolly Ophie Burrowes, [place]PT  Acute Rehabilitation Services Pager [phone] Office [phone]

## 2015-05-17 NOTE — Clinical Social Work Note (Signed)
Clinical Social Work Assessment  Patient Details  Name: Brandi Rivas MRN: [id number] Date of Birth: 11/26/1960  Date of referral:  05/17/15               Reason for consult:  Facility Placement                Permission sought to share information with:    Permission granted to share information::  Yes, Verbal Permission Granted  Name::        [place]   [facility] [place] [facility] [place] [place]  Relationship::     Contact Information:     Housing/Transportation Living arrangements for the past 2 months:  Apartment Source of Information:  Patient Patient Interpreter Needed:  None Criminal Activity/Legal Involvement Pertinent to Current Situation/Hospitalization:  No - Comment as needed Significant Relationships:  Adult Children Lives with:  Adult Children (sons) Do you feel safe going back to the place where you live?  No Need for family participation in patient care:  No (Coment)  Care giving concerns:  No caregivers present at time of discharge   Social Worker assessment / plan:  CSW spoke with patient regarding disposition.  Patient states she is not doing well after surgery and is requesting SNF.  PT has evaluated the patient and is also recommending SNF at this time.  PT and OT evals will both be needed for this patient for insurance authorization for STR.  OT notified.  Patient states she is from home with her two sons ages 4538 and 6230.  Patient states that one of her sons goes to [employer] and the other works full time so neither is available to assist her after surgery.  Patient presents with depressed affect and anxious regarding her disposition. CSW offered support and education surrounding the SNF process.  Patient acknowledged understanding.  CSW received verbal permission to complete SNF search.  Employment status:    Health and safety inspectornsurance information:  Managed Care ([employer]) PT Recommendations:  Skilled Nursing Facility Information / Referral to community resources:  Skilled Nursing  Facility  Patient/Family's Response to care:  [facility] [place]  Patient/Family's Understanding of and Emotional Response to Diagnosis, Current Treatment, and Prognosis:  Patient appears to be realistic regarding her needed therapies; however, patient seems to be minimally motivated to work with therapies.  Emotional Assessment Appearance:  Appears older than stated age Attitude/Demeanor/Rapport:    Affect (typically observed):  Accepting Orientation:  Oriented to Self, Oriented to Place, Oriented to  Time, Oriented to Situation Alcohol / Substance use:  Not Applicable Psych involvement (Current and /or in the community):  No (Comment)  Discharge Needs  Concerns to be addressed:  No discharge needs identified Readmission within the last 30 days:    Current discharge risk:    Barriers to Discharge:  English as a second language teachernsurance Authorization, Continued Medical Work up   Golden West Financialngle, Staisha Winiarski C, LCSW 05/17/2015, 11:37 AM

## 2015-05-17 NOTE — Evaluation (Signed)
Physical Therapy Evaluation Patient Details Name: Brandi Rivas MRN: [id number] DOB: August 07, 1960 Today's Date: 05/17/2015   History of Present Illness   Admitted for LTKA, [place] Rain[place]AT; has a past medical history of Hypertension; Arthritis; Acute urinary retention (12/10/2012); History of anemia; Urinary frequency; and Wears glasses.  Clinical Impression  Pt is s/p TKA resulting in the deficits listed below (see PT Problem List). Pain significantly limiting activity tolerance during eval; Rec SNF for postacute rehab to maximize independence and safety with mobility prior to dc home;  Pt will benefit from skilled PT to increase their independence and safety with mobility to allow discharge to the venue listed below.      Follow Up Recommendations SNF    Equipment Recommendations  Rolling walker with 5" wheels;3in1 (PT)    Recommendations for Other Services       Precautions / Restrictions Precautions Precautions: Knee;Fall Precaution Booklet Issued: Yes (comment) Precaution Comments: Pt educated to not allow any pillow or bolster under knee for healing with optimal range of motion. ; will need reinforcement Restrictions LLE Weight Bearing: Weight bearing as tolerated      Mobility  Bed Mobility Overal bed mobility: Needs Assistance Bed Mobility: Supine to Sit     Supine to sit: Mod assist     General bed mobility comments: Step-by-step cues for technique; Mod assist to help LLE off of the bed and then to hold support as she scooted to the EOB  Transfers Overall transfer level: Needs assistance Equipment used: Rolling walker (2 wheeled) Transfers: Sit to/from Stand Sit to Stand: Min assist         General transfer comment: Min assist to steady RW; pulled, then pushed through RW despite cues for safe hand placement; Min assist to stabilize RW; Very internally distracted by pain; difficulty taking in information  Ambulation/Gait Ambulation/Gait assistance: Min  assist Ambulation Distance (Feet): 4 Feet Assistive device: Rolling walker (2 wheeled) Gait Pattern/deviations: Step-to pattern;Decreased stance time - left;Decreased step length - right     General Gait Details: Heavy dependence on RW; small steps forward and then pivot steps to chair; cues for sequence and to activate L quad for stance stability; very painful  Stairs            Wheelchair Mobility    Modified Rankin (Stroke Patients Only)       Balance Overall balance assessment: Needs assistance         Standing balance support: Bilateral upper extremity supported Standing balance-Leahy Scale: Poor                               Pertinent Vitals/Pain Pain Assessment: Faces Pain Score: 10-Worst pain ever Faces Pain Scale: Hurts worst Pain Location: L knee, especially with any flexion Pain Descriptors / Indicators: Aching;Grimacing;Guarding;Crying Pain Intervention(s): Limited activity within patient's tolerance;Monitored during session;Repositioned;Patient requesting pain meds-RN notified    Home Living Family/patient expects to be discharged to:: Skilled nursing facility Living Arrangements: Children               Additional Comments: Children work during the day; she must be completely modified independent to dc home    Prior Function Level of Independence: Independent               Hand Dominance        Extremity/Trunk Assessment   Upper Extremity Assessment: Defer to OT evaluation;Overall [facility] for tasks assessed  Lower Extremity Assessment: LLE deficits/detail   LLE Deficits / Details: Significant pain during eval; did not tolerate active or passive flexion; noting some quad activation with moderate knee control in standing; Weight bearing LLE elicited tears     Communication   Communication: No difficulties  Cognition Arousal/Alertness: Awake/alert Behavior During Therapy: WFL for tasks  assessed/performed Overall Cognitive Status:  (Extremely internally distracted by pain)                      General Comments      Exercises        Assessment/Plan    PT Assessment Patient needs continued PT services  PT Diagnosis Difficulty walking;Acute pain   PT Problem List Decreased strength;Decreased range of motion;Decreased activity tolerance;Decreased balance;Decreased mobility;Decreased coordination;Decreased knowledge of use of DME;Decreased knowledge of precautions;Pain  PT Treatment Interventions DME instruction;Gait training;Stair training;Functional mobility training;Therapeutic activities;Therapeutic exercise;Balance training;Patient/family education   PT Goals (Current goals can be found in the Care Plan section) Acute Rehab PT Goals Patient Stated Goal: less pain PT Goal Formulation: With patient Time For Goal Achievement: 05/24/15 Potential to Achieve Goals: Good    Frequency 7X/week   Barriers to discharge Decreased caregiver support Sons work during the day; must be independet to [employer] home    Co-evaluation               End of Session Equipment Utilized During Treatment: Gait belt Activity Tolerance: Patient limited by pain Patient left: in chair;with call bell/phone within reach Nurse Communication: Mobility status;Patient requests pain meds         Time: 9147-8295 PT Time Calculation (min) (ACUTE ONLY): 16 min   Charges:   PT Evaluation $PT Eval Moderate Complexity: 1 Procedure     PT G Codes:        Van Clines Hamff 05/17/2015, 10:07 AM  Van Clines, PT  Acute Rehabilitation Services Pager [phone] Office [phone]

## 2015-05-17 NOTE — Evaluation (Addendum)
Occupational Therapy Evaluation Patient Details Name: Brandi Rivas MRN: [id number] DOB: [birth year] Today's Date: 05/17/2015    History of Present Illness  Admitted for Left TKA, WBAT; has a past medical history of Hypertension; Arthritis; PNA; bilateral arthroscopy on knees; Acute urinary retention (12/10/2012); History of anemia; Urinary frequency; Wears glasses and pt obese.   Clinical Impression   Pt s/p above. Pt planning to d/c to SNF upon d/c, so deferring all further OT needs to next venue of care.    Follow Up Recommendations  SNF    Equipment Recommendations  Other (comment) (defer to next venue)    Recommendations for Other Services       Precautions / Restrictions Precautions Precautions: Knee;Fall Precaution Booklet Issued: No Precaution Comments: educated on knee precautions Restrictions Weight Bearing Restrictions: Yes LLE Weight Bearing: Weight bearing as tolerated      Mobility Bed Mobility               General bed mobility comments: not assessed  Transfers Overall transfer level: Needs assistance Equipment used: Rolling walker (2 wheeled) Transfers: Sit to/from UGI CorporationStand;Stand Pivot Transfers Sit to Stand: Min guard Stand pivot transfers: Min guard (pivotal steps)       General transfer comment: cues for technique.    Balance      Used RW for ambulation and pivotal steps to BSC-Min guard.                                       ADL Overall ADL's : Needs assistance/impaired     Grooming: Standing;Set up;Supervision/safety (wiped off hands)               Lower Body Dressing: Min guard;Sit to/from stand   Toilet Transfer: Min guard;Ambulation;BSC;RW;Stand-pivot (pivotal steps to [facility] Surgical CenterBSC and also ambulated short distance in room)   Toileting- ArchitectClothing Manipulation and Hygiene: Min guard;Sit to/from stand       Functional mobility during ADLs: Min guard;Rolling walker General ADL Comments: Educated on LB dressing  technique.      Vision     Perception     Praxis      Pertinent Vitals/Pain Pain Assessment: 0-10 Pain Score: 10-Worst pain ever Pain Location: left lower extremity  Pain Descriptors / Indicators: Sore Pain Intervention(s): Monitored during session;Repositioned;Ice applied  HR up to 120s in session.     Hand Dominance     Extremity/Trunk Assessment Upper Extremity Assessment Upper Extremity Assessment: Overall WFL for tasks assessed   Lower Extremity Assessment Lower Extremity Assessment: Defer to PT evaluation       Communication Communication Communication: No difficulties   Cognition Arousal/Alertness: Awake/alert Behavior During Therapy: WFL for tasks assessed/performed;Flat affect Overall Cognitive Status: Within Functional Limits for tasks assessed                     General Comments       Exercises       Shoulder Instructions      Home Living Family/patient expects to be discharged to:: Skilled nursing facility Living Arrangements: Children                               Additional Comments: Children work during the day; she must be completely modified independent to d/c home      Prior Functioning/Environment Level of Independence: Independent  OT Diagnosis: Acute pain   OT Problem List: Decreased strength;Decreased range of motion;Decreased activity tolerance;Pain;Obesity;Decreased knowledge of precautions;Decreased knowledge of use of DME or AE   OT Treatment/Interventions:      OT Goals(Current goals can be found in the care plan section) Acute Rehab OT Goals Patient Stated Goal: get better  OT Frequency:     Barriers to D/C:            Co-evaluation              End of Session Equipment Utilized During Treatment: Gait belt;Rolling walker CPM Left Knee CPM Left Knee: Off Nurse Communication: Other (comment) (HR up to 120s in session)  Activity Tolerance: Other (comment) (increased HR;  pt also nauseous in session) Patient left: in chair;with call bell/phone within reach   Time: 9604-5409 OT Time Calculation (min): 16 min Charges:  OT General Charges $OT Visit: 1 Procedure OT Evaluation $OT Eval Moderate Complexity: 1 Procedure G-CodesEarlie Raveling OTR/L [phone] 05/17/2015, 3:46 PM

## 2015-05-17 NOTE — Progress Notes (Signed)
[name], female   DOB: 01-May-1960, 55 y.o.   MRN: [id number] [name]  MRN: [id number]  DOB/AGE:  54-Mar-1962 / 55 y.o.  1 Day Post-Op Procedure(s) (LRB): TOTAL KNEE ARTHROPLASTY (Left)    PROGRESS NOTE Subjective: Patient is alert, oriented, no Nausea, no Vomiting, yes passing gas. Taking PO well. Denies SOB, Chest or Calf Pain. Using Incentive Spirometer, PAS in place. Ambulate WBAT 0-40, CPM 0-40 Patient reports pain as 4/10 .    Objective: Vital signs in last 24 hours: Filed Vitals:   05/16/15 1500 05/16/15 2009 05/16/15 2239 05/17/15 0555  BP: 114/80  106/73 115/75  Pulse: 88  93 89  Temp: 97.8 F (36.6 C)  98.9 F (37.2 C) 97.8 F (36.6 C)  TempSrc: Oral     Resp: 18  18 18   Height:      Weight:      SpO2: 93% 96% 97% 97%      Intake/Output from previous day: I/O last 3 completed shifts: In: 2470 [P.O.:520; I.V.:1700; IV Piggyback:250] Out: 1100 [Urine:550; Blood:550]   Intake/Output this shift:     LABORATORY DATA: No results for input(s): WBC, HGB, HCT, PLT, NA, K, CL, CO2, BUN, CREATININE, GLUCOSE, GLUCAP, INR, CALCIUM in the last 72 hours.  Invalid input(s): PT, 2  Examination: Neurologically intact ABD soft Neurovascular intact Sensation intact distally Intact pulses distally Dorsiflexion/Plantar flexion intact Incision: dressing C/D/I No cellulitis present Compartment soft}  Assessment:   1 Day Post-Op Procedure(s) (LRB): TOTAL KNEE ARTHROPLASTY (Left) ADDITIONAL DIAGNOSIS: Expected Acute Blood Loss Anemia, Hx PE's  Plan: PT/OT WBAT, CPM 5/hrs day until ROM 0-90 degrees, then D/C CPM DVT Prophylaxis:  SCDx72hrs, Eloquist 2.5mg  bid x 2 weeks DISCHARGE PLAN: Home vs SNF based on PT response DISCHARGE NEEDS: HHPT, CPM, Walker and 3-in-1 comode seat     Zeynab Klett J 05/17/2015, 6:50 AM  no

## 2015-05-17 NOTE — Progress Notes (Signed)
Utilization review completed.  

## 2015-05-18 LAB — CBC
HCT: 29 % — ABNORMAL LOW (ref 36.0–46.0)
Hemoglobin: 9.3 g/dL — ABNORMAL LOW (ref 12.0–15.0)
MCH: 28.2 pg (ref 26.0–34.0)
MCHC: 32.1 g/dL (ref 30.0–36.0)
MCV: 87.9 fL (ref 78.0–100.0)
Platelets: 152 10*3/uL (ref 150–400)
RBC: 3.3 MIL/uL — ABNORMAL LOW (ref 3.87–5.11)
RDW: 13.6 % (ref 11.5–15.5)
WBC: 6.8 10*3/uL (ref 4.0–10.5)

## 2015-05-18 MED ORDER — APIXABAN 2.5 MG PO TABS: 2.5000 mg | ORAL_TABLET | Freq: Two times a day (BID) | ORAL | Status: DC

## 2015-05-18 NOTE — Clinical Social Work Note (Signed)
Patient to be d/c'ed today to [facility].  Patient and family agreeable to plans will transport via ems RN to call report to [phone].  Cleo Santucci, MSW, LCSWA [phone]  

## 2015-05-18 NOTE — Discharge Summary (Signed)
[name] MRN: [id number] DOB/AGE: 1960-12-02 55 y.o.  Admit date: 05/16/2015 Discharge date: 05/18/2015  Admission Diagnoses:  Principal Problem:   Primary osteoarthritis of left knee Active Problems:   Primary osteoarthritis of knee   Discharge Diagnoses:  Same  Past Medical History  Diagnosis Date  . Hypertension     PREVIOUSLY TOOK MED - BP STABLE - TAKEN OFF MED  . Sinus congestion   . Arthritis   . Ventral hernia   . Pneumonia 01/09/2011  . Acute urinary retention 12/10/2012  . History of anemia     "while pregnant"  . Urinary frequency   . Wears glasses     Surgeries: Procedure(s): TOTAL KNEE ARTHROPLASTY on 05/16/2015   Consultants:    Discharged Condition: Improved  Hospital Course: Brandi Rivas is an 55 y.o. female who was admitted 05/16/2015 for operative treatment ofPrimary osteoarthritis of left knee. Patient has severe unremitting pain that affects sleep, daily activities, and work/hobbies. After pre-op clearance the patient was taken to the operating room on 05/16/2015 and underwent  Procedure(s): TOTAL KNEE ARTHROPLASTY.    Patient was given perioperative antibiotics: Anti-infectives    Start     Dose/Rate Route Frequency Ordered Stop   05/16/15 0830  ceFAZolin (ANCEF) IVPB 2g/100 mL premix     2 g 200 mL/hr over 30 Minutes Intravenous To ShortStay Surgical 05/13/15 0848 05/16/15 [id number]       Patient was given sequential compression devices, early ambulation, and chemoprophylaxis to prevent DVT.  Patient benefited maximally from hospital stay and there were no complications.    Recent vital signs: Patient Vitals for the past 24 hrs:  BP Temp Temp src Pulse Resp SpO2  05/18/15 0748 - - - - - 95 %  05/18/15 0425 117/68 mmHg 98.7 F (37.1 C) Oral (!) 112 17 95 %  05/17/15 2051 - - - (!) 116 18 91 %  05/17/15 2049 102/68 mmHg 97.4 F (36.3 C) Oral (!) 117 18 92 %  05/17/15 1500 126/80 mmHg - - (!) 109 17 96 %  05/17/15 0817 -  - - - - 98 %     Recent laboratory studies:  Recent Labs  05/17/15 0702  WBC 7.6  HGB 8.8*  HCT 29.2*  PLT 149*  NA 133*  K 4.0  CL 103  CO2 23  BUN 8  CREATININE 0.78  GLUCOSE 138*  CALCIUM 8.3*     Discharge Medications:     Medication List    STOP taking these medications        ibuprofen 200 MG tablet  Commonly known as:  ADVIL,MOTRIN     traMADol 50 MG tablet  Commonly known as:  ULTRAM      TAKE these medications        albuterol 108 (90 Base) MCG/ACT inhaler  Commonly known as:  PROVENTIL HFA;VENTOLIN HFA  Inhale 1-2 puffs into the lungs every 6 (six) hours as needed for wheezing or shortness of breath.     apixaban 2.5 MG Tabs tablet  Commonly known as:  ELIQUIS  Take 1 tablet (2.5 mg total) by mouth every 12 (twelve) hours.     beclomethasone 40 MCG/ACT inhaler  Commonly known as:  QVAR  Inhale 2 puffs into the lungs 2 (two) times daily.     diclofenac sodium 1 % Gel  Commonly known as:  VOLTAREN  Apply 4 g topically 2 (two) times daily.     fluticasone 50 MCG/ACT nasal spray  Commonly known as:  FLONASE  Place 2 sprays into the nose daily as needed for rhinitis.     HYDROmorphone 2 MG tablet  Commonly known as:  DILAUDID  Take 1 tablet (2 mg total) by mouth every 4 (four) hours as needed for severe pain.     methocarbamol 500 MG tablet  Commonly known as:  ROBAXIN  Take 1 tablet (500 mg total) by mouth 2 (two) times daily with a meal.        Diagnostic Studies: Dg Chest 2 View  05/04/2015  CLINICAL DATA:  Cough. Hypertension. Preop respiratory exam for left lower extremity amputation. EXAM: CHEST  2 VIEW COMPARISON:  04/13/2014 FINDINGS: The heart size and mediastinal contours are within normal limits. Mild right basilar scarring again noted. No evidence of pulmonary infiltrate or edema. No evidence of pleural effusion or pneumothorax. Mild thoracic spine degenerative changes again noted. IMPRESSION: Stable mild right basilar scarring.   No active disease. Electronically Signed   By: Myles Rosenthal M.D.   On: 05/04/2015 16:24    Disposition: 01-Home or Self Care      Discharge Instructions    CPM    Complete by:  As directed   Continuous passive motion machine (CPM):      Use the CPM from 0 to 60  for 5 hours per day.      You may increase by 10 degrees per day.  You may break it up into 2 or 3 sessions per day.      Use CPM for 2 weeks or until you are told to stop.     Call MD / Call 911    Complete by:  As directed   If you experience chest pain or shortness of breath, CALL 911 and be transported to the hospital emergency room.  If you develope a fever above 101 F, pus (white drainage) or increased drainage or redness at the wound, or calf pain, call your surgeon's office.     Constipation Prevention    Complete by:  As directed   Drink plenty of fluids.  Prune juice may be helpful.  You may use a stool softener, such as Colace (over the counter) 100 mg twice a day.  Use MiraLax (over the counter) for constipation as needed.     Diet - low sodium heart healthy    Complete by:  As directed      Driving restrictions    Complete by:  As directed   No driving for 2 weeks     Increase activity slowly as tolerated    Complete by:  As directed      Patient may shower    Complete by:  As directed   You may shower without a dressing once there is no drainage.  Do not wash over the wound.  If drainage remains, cover wound with plastic wrap and then shower.           Follow-up Information    Follow up with Nestor Lewandowsky, MD In 2 weeks.   Specialty:  Orthopedic Surgery   Contact information:   [address] [place] [address] [phone]        Signed: Vear Clock Mikiala Fugett R 05/18/2015, 7:55 AM

## 2015-05-18 NOTE — Progress Notes (Signed)
Physical Therapy Treatment Patient Details Name: Brandi Rivas MRN: [id number] DOB: 08-12-1960 Today's Date: 05/18/2015    History of Present Illness  Admitted for Left TKA, WBAT; has a past medical history of Hypertension; Arthritis; PNA; bilateral arthroscopy on knees; Acute urinary retention (12/10/2012); History of anemia; Urinary frequency; Wears glasses and pt obese.    PT Comments    Moving very slowly, but motivated to get better, and able to increase amb distance; Needing assist with all TKA therex; will continue to work on these  Follow Up Recommendations  SNF     Equipment Recommendations  Rolling walker with 5" wheels;3in1 (PT)    Recommendations for Other Services       Precautions / Restrictions Precautions Precautions: Knee;Fall Precaution Comments: educated on knee precautions Restrictions LLE Weight Bearing: Weight bearing as tolerated    Mobility  Bed Mobility Overal bed mobility: Needs Assistance Bed Mobility: Supine to Sit     Supine to sit: Min assist     General bed mobility comments: min assist to support LLE coming off the bed; Moving very slowly  Transfers Overall transfer level: Needs assistance Equipment used: Rolling walker (2 wheeled) Transfers: Sit to/from Stand Sit to Stand: Min guard         General transfer comment: Cues for hand placement and technique  Ambulation/Gait Ambulation/Gait assistance: Min assist Ambulation Distance (Feet): 40 Feet Assistive device: Rolling walker (2 wheeled) Gait Pattern/deviations: Step-to pattern;Decreased step length - right;Decreased stance time - left   Gait velocity interpretation: Below normal speed for age/gender General Gait Details: Heavy dependence on RW; cues for sequence and to activate L quad for stance stability; very painful; inititally needing physical assist to advance LLE, then able to step without physical assist; moving very slowly   Risk managertairs            Wheelchair  Mobility    Modified Rankin (Stroke Patients Only)       Balance                                    Cognition Arousal/Alertness: Awake/alert Behavior During Therapy: WFL for tasks assessed/performed;Flat affect Overall Cognitive Status: Within Functional Limits for tasks assessed                      Exercises Total Joint Exercises Ankle Circles/Pumps: AROM;Both;20 reps Quad Sets: AROM;Left;10 reps Short Arc QuadBarbaraann Boys: AAROM;Left;10 reps Heel Slides: AAROM;Left;10 reps Straight Leg Raises: AAROM;Left;10 reps Goniometric ROM: approx 8 to 50 deg    General Comments        Pertinent Vitals/Pain Pain Assessment: Faces Faces Pain Scale: Hurts even more Pain Location: L knee, especially with knee flexion Pain Descriptors / Indicators: Aching Pain Intervention(s): Monitored during session;Repositioned;Premedicated before session    Home Living                      Prior Function            PT Goals (current goals can now be found in the care plan section) Acute Rehab PT Goals Patient Stated Goal: get better PT Goal Formulation: With patient Time For Goal Achievement: 05/24/15 Potential to Achieve Goals: Good Progress towards PT goals: Progressing toward goals    Frequency  7X/week    PT Plan Current plan remains appropriate    Co-evaluation  End of Session Equipment Utilized During Treatment: Gait belt Activity Tolerance: Patient limited by pain (but still working hard to incr amb distance) Patient left: in chair;with call bell/phone within reach     Time: 0853-0928 PT Time Calculation (min) (ACUTE ONLY): 35 min  Charges:  $Gait Training: 8-22 mins $Therapeutic Exercise: 8-22 mins                    G Codes:      Olen Pel 05/18/2015, 9:46 AM  Van Clines, PT  Acute Rehabilitation Services Pager [phone] Office [phone]

## 2015-05-18 NOTE — Progress Notes (Addendum)
[name]  MRN: [id number]  DOB/AGE:  [birth year] / 55 y.o.  2 Days Post-Op Procedure(s) (LRB): TOTAL KNEE ARTHROPLASTY (Left)    PROGRESS NOTE Subjective: Patient is alert, oriented, no Nausea, no Vomiting, yes passing gas. Taking PO with small bites. Denies SOB, Chest or Calf Pain. Using Incentive Spirometer, PAS in place. Ambulate WBAT with pt walking 20 ft with therapy, CPM 0-40 Patient reports pain as 10/10 .    Objective: Vital signs in last 24 hours: Filed Vitals:   05/17/15 1500 05/17/15 2049 05/17/15 2051 05/18/15 0425  BP: 126/80 102/68  117/68  Pulse: 109 117 116 112  Temp:  97.4 F (36.3 C)  98.7 F (37.1 C)  TempSrc:  Oral  Oral  Resp: 17 18 18 17   Height:      Weight:      SpO2: 96% 92% 91% 95%      Intake/Output from previous day: I/O last 3 completed shifts: In: 1200 [P.O.:1200] Out: -    Intake/Output this shift:     LABORATORY DATA:  Recent Labs  05/17/15 0702  WBC 7.6  HGB 8.8*  HCT 29.2*  PLT 149*  NA 133*  K 4.0  CL 103  CO2 23  BUN 8  CREATININE 0.78  GLUCOSE 138*  CALCIUM 8.3*    Examination: Neurologically intact Neurovascular intact Sensation intact distally Intact pulses distally Dorsiflexion/Plantar flexion intact Incision: dressing C/D/I No cellulitis present Compartment soft}  Assessment:   2 Days Post-Op Procedure(s) (LRB): TOTAL KNEE ARTHROPLASTY (Left) ADDITIONAL DIAGNOSIS: Expected Acute Blood Loss Anemia, Hx of PE's  Plan: PT/OT WBAT, CPM 5/hrs day until ROM 0-90 degrees, then D/C CPM DVT Prophylaxis:  SCDx72hrs, Eliquis 2.5 mg BID x 2 weeks DISCHARGE PLAN: Skilled Nursing Facility/Rehab when bed available DISCHARGE NEEDS: HHPT, CPM, Walker and 3-in-1 comode seat     Brandi Rivas R 05/18/2015, 7:48 AM

## 2015-05-18 NOTE — Discharge Summary (Deleted)
[name] MRN: [id number] DOB/AGE: 09/27/1960 55 y.o.  Admit date: 05/16/2015 Discharge date: 05/18/2015  Admission Diagnoses:  Principal Problem:   Primary osteoarthritis of left knee Active Problems:   Primary osteoarthritis of knee   Discharge Diagnoses:  Same  Past Medical History  Diagnosis Date  . Hypertension     PREVIOUSLY TOOK MED - BP STABLE - TAKEN OFF MED  . Sinus congestion   . Arthritis   . Ventral hernia   . Pneumonia 01/09/2011  . Acute urinary retention 12/10/2012  . History of anemia     "while pregnant"  . Urinary frequency   . Wears glasses     Surgeries: Procedure(s): TOTAL KNEE ARTHROPLASTY on 05/16/2015   Consultants:    Discharged Condition: Improved  Hospital Course: Brandi Rivas is an 55 y.o. female who was admitted 05/16/2015 for operative treatment ofPrimary osteoarthritis of left knee. Patient has severe unremitting pain that affects sleep, daily activities, and work/hobbies. After pre-op clearance the patient was taken to the operating room on 05/16/2015 and underwent  Procedure(s): TOTAL KNEE ARTHROPLASTY.    Patient was given perioperative antibiotics: Anti-infectives    Start     Dose/Rate Route Frequency Ordered Stop   05/16/15 0830  ceFAZolin (ANCEF) IVPB 2g/100 mL premix     2 g 200 mL/hr over 30 Minutes Intravenous To ShortStay Surgical 05/13/15 0848 05/16/15 [id number]       Patient was given sequential compression devices, early ambulation, and chemoprophylaxis to prevent DVT.  Patient benefited maximally from hospital stay and there were no complications.    Recent vital signs: Patient Vitals for the past 24 hrs:  BP Temp Temp src Pulse Resp SpO2  05/18/15 0748 - - - - - 95 %  05/18/15 0425 117/68 mmHg 98.7 F (37.1 C) Oral (!) 112 17 95 %  05/17/15 2051 - - - (!) 116 18 91 %  05/17/15 2049 102/68 mmHg 97.4 F (36.3 C) Oral (!) 117 18 92 %  05/17/15 1500 126/80 mmHg - - (!) 109 17 96 %  05/17/15 0817 - -  - - - 98 %     Recent laboratory studies:  Recent Labs  05/17/15 0702  WBC 7.6  HGB 8.8*  HCT 29.2*  PLT 149*  NA 133*  K 4.0  CL 103  CO2 23  BUN 8  CREATININE 0.78  GLUCOSE 138*  CALCIUM 8.3*     Discharge Medications:     Medication List    STOP taking these medications        ibuprofen 200 MG tablet  Commonly known as:  ADVIL,MOTRIN     traMADol 50 MG tablet  Commonly known as:  ULTRAM      TAKE these medications        albuterol 108 (90 Base) MCG/ACT inhaler  Commonly known as:  PROVENTIL HFA;VENTOLIN HFA  Inhale 1-2 puffs into the lungs every 6 (six) hours as needed for wheezing or shortness of breath.     aspirin EC 325 MG tablet  Take 1 tablet (325 mg total) by mouth 2 (two) times daily.     beclomethasone 40 MCG/ACT inhaler  Commonly known as:  QVAR  Inhale 2 puffs into the lungs 2 (two) times daily.     diclofenac sodium 1 % Gel  Commonly known as:  VOLTAREN  Apply 4 g topically 2 (two) times daily.     fluticasone 50 MCG/ACT nasal spray  Commonly known as:  FLONASE  Place 2 sprays into the nose daily as needed for rhinitis.     HYDROmorphone 2 MG tablet  Commonly known as:  DILAUDID  Take 1 tablet (2 mg total) by mouth every 4 (four) hours as needed for severe pain.     methocarbamol 500 MG tablet  Commonly known as:  ROBAXIN  Take 1 tablet (500 mg total) by mouth 2 (two) times daily with a meal.        Diagnostic Studies: Dg Chest 2 View  05/04/2015  CLINICAL DATA:  Cough. Hypertension. Preop respiratory exam for left lower extremity amputation. EXAM: CHEST  2 VIEW COMPARISON:  04/13/2014 FINDINGS: The heart size and mediastinal contours are within normal limits. Mild right basilar scarring again noted. No evidence of pulmonary infiltrate or edema. No evidence of pleural effusion or pneumothorax. Mild thoracic spine degenerative changes again noted. IMPRESSION: Stable mild right basilar scarring.  No active disease. Electronically Signed    By: Myles Rosenthal M.D.   On: 05/04/2015 16:24    Disposition: 01-Home or Self Care      Discharge Instructions    CPM    Complete by:  As directed   Continuous passive motion machine (CPM):      Use the CPM from 0 to 60  for 5 hours per day.      You may increase by 10 degrees per day.  You may break it up into 2 or 3 sessions per day.      Use CPM for 2 weeks or until you are told to stop.     Call MD / Call 911    Complete by:  As directed   If you experience chest pain or shortness of breath, CALL 911 and be transported to the hospital emergency room.  If you develope a fever above 101 F, pus (white drainage) or increased drainage or redness at the wound, or calf pain, call your surgeon's office.     Constipation Prevention    Complete by:  As directed   Drink plenty of fluids.  Prune juice may be helpful.  You may use a stool softener, such as Colace (over the counter) 100 mg twice a day.  Use MiraLax (over the counter) for constipation as needed.     Diet - low sodium heart healthy    Complete by:  As directed      Driving restrictions    Complete by:  As directed   No driving for 2 weeks     Increase activity slowly as tolerated    Complete by:  As directed      Patient may shower    Complete by:  As directed   You may shower without a dressing once there is no drainage.  Do not wash over the wound.  If drainage remains, cover wound with plastic wrap and then shower.           Follow-up Information    Follow up with Nestor Lewandowsky, MD In 2 weeks.   Specialty:  Orthopedic Surgery   Contact information:   [address] [place] [address] [phone]        Signed: Vear Clock Brandi Rivas 05/18/2015, 7:52 AM

## 2015-05-19 ENCOUNTER — Non-Acute Institutional Stay: Payer: BLUE CROSS/BLUE SHIELD | Admitting: Adult Health

## 2015-05-19 ENCOUNTER — Encounter: Payer: Self-pay | Admitting: Adult Health

## 2015-05-19 DIAGNOSIS — I10 Essential (primary) hypertension: Secondary | ICD-10-CM

## 2015-05-19 DIAGNOSIS — M1712 Unilateral primary osteoarthritis, left knee: Secondary | ICD-10-CM

## 2015-05-19 DIAGNOSIS — J309 Allergic rhinitis, unspecified: Secondary | ICD-10-CM

## 2015-05-19 DIAGNOSIS — D62 Acute posthemorrhagic anemia: Secondary | ICD-10-CM

## 2015-05-19 DIAGNOSIS — R110 Nausea: Secondary | ICD-10-CM

## 2015-05-19 DIAGNOSIS — K5900 Constipation, unspecified: Secondary | ICD-10-CM | POA: Diagnosis not present

## 2015-05-19 DIAGNOSIS — K5909 Other constipation: Secondary | ICD-10-CM

## 2015-05-19 NOTE — Progress Notes (Signed)
[name], female   DOB: Jan 27, 1961, 55 y.o.   MRN: [id number]    DATE:  05/19/2015   MRN:  [id number]  BIRTHDAY: 08/02/1960  Facility:  Nursing Home Location:  [facility]  Nursing Home Room Number: 606-P  LEVEL OF CARE:  SNF (31)  Contact Information    Name Relation Home Work Willey Son [phone]     Rush,Cora Mother [phone]         Code Status History    Date Active Date Inactive Code Status Order ID Comments User Context   04/18/2013  1:09 AM 04/20/2013  4:23 PM Full Code [id number]  Brandi Doyne, MD Inpatient   12/09/2012 11:54 AM 12/11/2012  2:40 PM Full Code [id number]  Brandi Soda, MD Inpatient   10/20/2011  2:33 AM 10/20/2011  3:09 PM Full Code [id number]  Brandi Roller, MD Rivas   01/10/2011  2:04 AM 01/12/2011  7:28 PM Full Code [id number]  Brandi Rivas       Chief Complaint  Patient presents with  . Hospitalization Follow-up    HISTORY OF PRESENT ILLNESS:  This is a 55 year old female who has been admitted to [facility] on 05/18/15 from [facility]. She has PMH of hypertension, urinary frequency, PE and ventral hernia. She has osteoarthritis of left knee for which she had left total knee arthroplasty on 05/16/15. She is seen in her room today. She complains of nausea. She reports having bowel movement last night.  She has been admitted for a short-term rehabilitation.  PAST MEDICAL HISTORY:  Past Medical History  Diagnosis Date  . Hypertension     PREVIOUSLY TOOK MED - BP STABLE - TAKEN OFF MED  . Sinus congestion   . Arthritis   . Ventral hernia   . Pneumonia 01/09/2011  . Acute urinary retention 12/10/2012  . History of anemia     "while pregnant"  . Urinary frequency   . Wears glasses      CURRENT MEDICATIONS: Reviewed  Patient's Medications  New Prescriptions   No medications on file  Previous Medications   ALBUTEROL (PROVENTIL HFA;VENTOLIN HFA) 108 (90 BASE) MCG/ACT INHALER     Inhale 1-2 puffs into the lungs every 6 (six) hours as needed for wheezing or shortness of breath.   APIXABAN (ELIQUIS) 2.5 MG TABS TABLET    Take 1 tablet (2.5 mg total) by mouth every 12 (twelve) hours.   BECLOMETHASONE (QVAR) 40 MCG/ACT INHALER    Inhale 2 puffs into the lungs 2 (two) times daily.   DOCUSATE SODIUM (COLACE) 100 MG CAPSULE    Take 100 mg by mouth 2 (two) times daily.   FLUTICASONE (FLONASE) 50 MCG/ACT NASAL SPRAY    Place 2 sprays into the nose daily as needed for rhinitis.   HYDROMORPHONE (DILAUDID) 2 MG TABLET    Take 1 tablet (2 mg total) by mouth every 4 (four) hours as needed for severe pain.   METHOCARBAMOL (ROBAXIN) 500 MG TABLET    Take 1 tablet (500 mg total) by mouth 2 (two) times daily with a meal.   POLYETHYLENE GLYCOL (MIRALAX / GLYCOLAX) PACKET    Take 17 g by mouth daily as needed for mild constipation.   PROMETHAZINE (PHENERGAN) 25 MG TABLET    Take 25 mg by mouth every 8 (eight) hours as needed for nausea.  Modified Medications   No medications on file  Discontinued Medications   DICLOFENAC SODIUM (VOLTAREN) 1 %  GEL    Apply 4 g topically 2 (two) times daily.     Allergies  Allergen Reactions  . Oxycodone Itching and Rash     REVIEW OF SYSTEMS:  GENERAL: no change in appetite, no fatigue, no weight changes, no fever, chills or weakness EYES: Denies change in vision, dry eyes, eye pain, itching or discharge EARS: Denies change in hearing, ringing in ears, or earache NOSE: Denies nasal congestion or epistaxis MOUTH and THROAT: Denies oral discomfort, gingival pain or bleeding, pain from teeth or hoarseness   RESPIRATORY: no cough, SOB, DOE, wheezing, hemoptysis CARDIAC: no chest pain, edema or palpitations GI: no abdominal pain, diarrhea, constipation, heart burn, +nausea  GU: Denies dysuria, frequency, hematuria, incontinence, or discharge PSYCHIATRIC: Denies feeling of depression or anxiety. No report of hallucinations, insomnia, paranoia, or  agitation   PHYSICAL EXAMINATION  GENERAL APPEARANCE: Well nourished. In no acute distress. Obese SKIN:  Left knee surgical incision is covered with aquacel dressing and ACE wrap, dry HEAD: Normal in size and contour. No evidence of trauma EYES: Lids open and close normally. No blepharitis, entropion or ectropion. PERRL. Conjunctivae are clear and sclerae are white. Lenses are without opacity EARS: Pinnae are normal. Patient hears normal voice tunes of the examiner MOUTH and THROAT: Lips are without lesions. Oral mucosa is moist and without lesions. Tongue is normal in shape, size, and color and without lesions NECK: supple, trachea midline, no neck masses, no thyroid tenderness, no thyromegaly LYMPHATICS: no LAN in the neck, no supraclavicular LAN RESPIRATORY: breathing is even & unlabored, BS CTAB CARDIAC: RRR, no murmur,no extra heart sounds, no edema GI: abdomen soft, normal BS, no masses, no tenderness, no hepatomegaly, no splenomegaly EXTREMITIES:  Able to move X 4 extremities PSYCHIATRIC: Alert and oriented X 3. Affect and behavior are appropriate  LABS/RADIOLOGY: Labs reviewed: Basic Metabolic Panel:  Recent Labs  78/29/56 1526 05/17/15 0702  NA 136 133*  K 3.7 4.0  CL 102 103  CO2 25 23  GLUCOSE 88 138*  BUN 9 8  CREATININE 0.75 0.78  CALCIUM 9.1 8.3*   CBC:  Recent Labs  05/04/15 1525 05/17/15 0702 05/18/15 0716  WBC 4.8 7.6 6.8  NEUTROABS 2.1  --   --   HGB 11.5* 8.8* 9.3*  HCT 37.3 29.2* 29.0*  MCV 89.0 89.3 87.9  PLT 194 149* 152     Dg Chest 2 View  05/04/2015  CLINICAL DATA:  Cough. Hypertension. Preop respiratory exam for left lower extremity amputation. EXAM: CHEST  2 VIEW COMPARISON:  04/13/2014 FINDINGS: The heart size and mediastinal contours are within normal limits. Mild right basilar scarring again noted. No evidence of pulmonary infiltrate or edema. No evidence of pleural effusion or pneumothorax. Mild thoracic spine degenerative changes  again noted. IMPRESSION: Stable mild right basilar scarring.  No active disease. Electronically Signed   By: Brandi Rivas M.D.   On: 05/04/2015 16:24    ASSESSMENT/PLAN:  Osteoarthritis S/P left total knee arthroplasty - for rehabilitation; LLE WBAT; continue Dilaudid 2 mg 1 tab by mouth every 4 hours when necessary for pain; Robaxin 500 mg 1 tab by mouth twice a day for muscle spasm; Eliquis 2.5 mg 1 tab by mouth every 12 hours for DVT prophylaxis; follow-up with Dr. Turner Daniels, orthopedic surgeon, in 2 weeks  Allergic rhinitis - Flonase 50 g 2 sprays into the nose daily when necessary  Constipation - continue MiraLAX 17 g by mouth daily when necessary and Colace 100 mg 1 capsule by  mouth twice a day  Hypertension - diet controlled; on no medication  Anemia, acute blood loss - hemoglobin 9.3; check CBC  Nausea - start Phenergan 25 mg 1 tab by mouth every 8 hours when necessary; check CMP      Goals of care:  Short-term rehabilitation     [facility] CenterMED[facility]-VARGAS,Brandi Schreifels, NP [facility] [phone]

## 2015-05-20 DIAGNOSIS — Z96651 Presence of right artificial knee joint: Secondary | ICD-10-CM | POA: Diagnosis not present

## 2015-05-20 LAB — HEPATIC FUNCTION PANEL
ALT: 8 U/L (ref 7–35)
AST: 11 U/L — AB (ref 13–35)
Alkaline Phosphatase: 39 U/L (ref 25–125)
Bilirubin, Total: 1.4 mg/dL

## 2015-05-20 LAB — BASIC METABOLIC PANEL
BUN: 11 mg/dL (ref 4–21)
Creatinine: 0.7 mg/dL (ref 0.5–1.1)
Glucose: 96 mg/dL
Potassium: 3.5 mmol/L (ref 3.4–5.3)
Sodium: 135 mmol/L — AB (ref 137–147)

## 2015-05-20 LAB — CBC AND DIFFERENTIAL
HCT: 28 % — AB (ref 36–46)
Hemoglobin: 8.4 g/dL — AB (ref 12.0–16.0)
Neutrophils Absolute: 2 /uL
Platelets: 211 10*3/uL (ref 150–399)
WBC: 4.4 10^3/mL

## 2015-05-23 ENCOUNTER — Encounter: Payer: Self-pay | Admitting: Adult Health

## 2015-05-23 NOTE — Progress Notes (Signed)
[name], female   DOB: 08/19/1960, 55 y.o.   MRN: [id number]

## 2015-05-24 ENCOUNTER — Encounter: Payer: Self-pay | Admitting: Internal Medicine

## 2015-05-24 ENCOUNTER — Non-Acute Institutional Stay (SKILLED_NURSING_FACILITY): Payer: BLUE CROSS/BLUE SHIELD | Admitting: Internal Medicine

## 2015-05-24 DIAGNOSIS — R2681 Unsteadiness on feet: Secondary | ICD-10-CM

## 2015-05-24 DIAGNOSIS — R51 Headache: Secondary | ICD-10-CM

## 2015-05-24 DIAGNOSIS — K219 Gastro-esophageal reflux disease without esophagitis: Secondary | ICD-10-CM

## 2015-05-24 DIAGNOSIS — E871 Hypo-osmolality and hyponatremia: Secondary | ICD-10-CM

## 2015-05-24 DIAGNOSIS — M1712 Unilateral primary osteoarthritis, left knee: Secondary | ICD-10-CM

## 2015-05-24 DIAGNOSIS — R600 Localized edema: Secondary | ICD-10-CM

## 2015-05-24 DIAGNOSIS — K5900 Constipation, unspecified: Secondary | ICD-10-CM | POA: Diagnosis not present

## 2015-05-24 DIAGNOSIS — D62 Acute posthemorrhagic anemia: Secondary | ICD-10-CM | POA: Diagnosis not present

## 2015-05-24 DIAGNOSIS — R519 Headache, unspecified: Secondary | ICD-10-CM

## 2015-05-24 NOTE — Progress Notes (Signed)
LOCATION: [facility]  PCP: Lupe Carney, MD   Code Status: Full Code  Goals of care: Advanced Directive information Advanced Directives 05/04/2015  Does patient have an advance directive? No  Would patient like information on creating an advanced directive? No - patient declined information  Pre-existing out of facility DNR order (yellow form or pink MOST form) -       Extended Emergency Contact Information Primary Emergency Contact: Wurtzel,Shawn Address: [address]          [place], [place] Home Phone: [phone] Relation: Son Secondary Emergency Contact: Rush,Cora  [place] Home Phone: [phone] Relation: Mother   Allergies  Allergen Reactions  . Oxycodone Itching and Rash    Chief Complaint  Patient presents with  . New Admit To SNF    New Admission     HPI:  Patient is a 55 y.o. female seen today for short term rehabilitation post hospital admission from 05/16/15-05/18/15 with primary OA of left knee. She underwent left knee arthroplasty. She is seen in her room today. Her pain is under control with current pain regimen. Per nursing, has elevated HR of 100-114. She denies any chest pain or palpitations  Review of Systems:  Constitutional: Negative for fever, chills, diaphoresis. Energy level is returning.  HENT: Negative for  congestion, nasal discharge. Positive for headache Eyes: Negative for blurred vision, double vision and discharge. Wears glasses.  Respiratory: Negative for cough, shortness of breath and wheezing.   Cardiovascular: Negative for chest pain, palpitations, leg swelling.  Gastrointestinal: Negative for heartburn,  vomiting, abdominal pain, loss of appetite. Has been constipated. Last bowel movement was prior to being admitted. Passing gas. Positive for nausea. Phenergan is helping. Heartburn under control with prilosec Genitourinary: Negative for dysuria and flank pain.    Musculoskeletal: Negative for back pain, fall.  Skin: Negative for itching, rash.  Neurological: Negative for dizziness. Psychiatric/Behavioral: Negative for depression   Past Medical History  Diagnosis Date  . Hypertension     PREVIOUSLY TOOK MED - BP STABLE - TAKEN OFF MED  . Sinus congestion   . Arthritis   . Ventral hernia   . Pneumonia 01/09/2011  . Acute urinary retention 12/10/2012  . History of anemia     "while pregnant"  . Urinary frequency   . Wears glasses    Past Surgical History  Procedure Laterality Date  . Arthroscopy knee w/ drilling Bilateral   . Ventral hernia repair N/A 12/09/2012    Procedure: LAPAROSCOPIC VENTRAL WALL HERNIA REPAIR;  Surgeon: Ardeth Sportsman, MD;  Location: [facility];  Service: General;  Laterality: N/A;  . Insertion of mesh N/A 12/09/2012    Procedure: INSERTION OF MESH;  Surgeon: Ardeth Sportsman, MD;  Location: [facility];  Service: General;  Laterality: N/A;  . Colonoscopy    . Esophagogastroduodenoscopy    . Total knee arthroplasty Left 05/16/2015    Procedure: TOTAL KNEE ARTHROPLASTY;  Surgeon: Gean Birchwood, MD;  Location: [facility] OR;  Service: Orthopedics;  Laterality: Left;   Social History:   reports that she has never smoked. She has never used smokeless tobacco. She reports that she does not drink alcohol or use illicit drugs.  Family History  Problem Relation Age of Onset  . Hypertension Mother   . Diabetes type II Mother   . Hypertension Father   . Colon cancer Father     Medications:   Medication List  This list is accurate as of: 05/24/15  3:01 PM.  Always use your most recent med list.               albuterol 108 (90 Base) MCG/ACT inhaler  Commonly known as:  PROVENTIL HFA;VENTOLIN HFA  Inhale 1-2 puffs into the lungs every 6 (six) hours as needed for wheezing or shortness of breath.     apixaban 2.5 MG Tabs tablet  Commonly known as:  ELIQUIS  Take 1 tablet (2.5 mg total) by mouth every 12 (twelve) hours.      beclomethasone 40 MCG/ACT inhaler  Commonly known as:  QVAR  Inhale 2 puffs into the lungs every 12 (twelve) hours.     docusate sodium 100 MG capsule  Commonly known as:  COLACE  Take 100 mg by mouth 2 (two) times daily.     ferrous sulfate 325 (65 FE) MG tablet  Take 325 mg by mouth 2 (two) times daily with a meal.     fluticasone 50 MCG/ACT nasal spray  Commonly known as:  FLONASE  Place 2 sprays into the nose daily as needed for rhinitis.     HYDROmorphone 2 MG tablet  Commonly known as:  DILAUDID  Take 1 tablet (2 mg total) by mouth every 4 (four) hours as needed for severe pain.     methocarbamol 500 MG tablet  Commonly known as:  ROBAXIN  Take 1 tablet (500 mg total) by mouth 2 (two) times daily with a meal.     omeprazole 20 MG capsule  Commonly known as:  PRILOSEC  Take 20 mg by mouth daily.     polyethylene glycol packet  Commonly known as:  MIRALAX / GLYCOLAX  Take 17 g by mouth daily as needed for mild constipation.     PROCEL Powd  Take 2 scoop by mouth 2 (two) times daily.     promethazine 25 MG tablet  Commonly known as:  PHENERGAN  Take 25 mg by mouth every 8 (eight) hours as needed for nausea.        Immunizations: Immunization History  Administered Date(s) Administered  . Influenza Split 01/10/2011  . Influenza,inj,Quad PF,36+ Mos 12/10/2012  . PPD Test 05/18/2015  . Pneumococcal Polysaccharide-23 01/10/2011     Physical Exam: Filed Vitals:   05/24/15 1455  BP: 130/81  Pulse: 114  Temp: 100.9 F (38.3 C)  TempSrc: Oral  Resp: 18  Height: 5\' 6"  (1.676 m)  Weight: 223 lb 12.8 oz (101.515 kg)  SpO2: 95%   Body mass index is 36.14 kg/(m^2).  General- adult female, obese, in no acute distress Head- normocephalic, atraumatic Nose- no maxillary or frontal sinus tenderness, no nasal discharge Throat- moist mucus membrane Eyes- PERRLA, EOMI, no pallor, no icterus, no discharge, normal conjunctiva, normal sclera Neck- no cervical  lymphadenopathy Cardiovascular- normal s1,s2, no murmur, trace left leg edema Respiratory- bilateral clear to auscultation, no wheeze, no rhonchi, no crackles, no use of accessory muscles Abdomen- bowel sounds present, soft, non tender Musculoskeletal- able to move all 4 extremities, limited left knee range of motion Neurological- alert and oriented to person, place and time Skin- warm and dry, left knee surgical incision with aquacel dressing Psychiatry- normal mood and affect    Labs reviewed: Basic Metabolic Panel:  Recent Labs  16/11/9601/29/17 1526 05/17/15 0702 05/20/15  NA 136 133* 135*  K 3.7 4.0 3.5  CL 102 103  --   CO2 25 23  --   GLUCOSE 88 138*  --  BUN CREATININE 0.75 0.78 0.7  CALCIUM 9.1 8.3*  --    Liver Function Tests:  Recent Labs  05/20/15  AST 11*  ALT 8  ALKPHOS 39   No results for input(s): LIPASE, AMYLASE in the last 8760 hours. No results for input(s): AMMONIA in the last 8760 hours. CBC:  Recent Labs  05/04/15 1525 05/17/15 0702 05/18/15 0716 05/20/15  WBC 4.8 7.6 6.8 4.4  NEUTROABS 2.1  --   --  2  HGB 11.5* 8.8* 9.3* 8.4*  HCT 37.3 29.2* 29.0* 28*  MCV 89.0 89.3 87.9  --   PLT 194 149* 152 211    Radiological Exams: Dg Chest 2 View  05/04/2015  CLINICAL DATA:  Cough. Hypertension. Preop respiratory exam for left lower extremity amputation. EXAM: CHEST  2 VIEW COMPARISON:  04/13/2014 FINDINGS: The heart size and mediastinal contours are within normal limits. Mild right basilar scarring again noted. No evidence of pulmonary infiltrate or edema. No evidence of pleural effusion or pneumothorax. Mild thoracic spine degenerative changes again noted. IMPRESSION: Stable mild right basilar scarring.  No active disease. Electronically Signed   By: Myles Rosenthal M.D.   On: 05/04/2015 16:24    Assessment/Plan  Unsteady gait Post left knee surgery. Will have patient work with PT/OT as tolerated to regain strength and restore function.  Fall  precautions are in place.  Left knee OA S/p left TKA. Has follow up with orthopedics. WBAT to LLE. Continue eliquis for dvt prophylaxis. Continue dilaudid 2 mg q4h prn pain and robaxin 500 mg bid for muscle spasm. Continue CPM machine  Left leg edema From stasis post surgery, add ted hose  Blood loss anemia Post op, monitor cbc. Continue iron supplement  Hyponatremia Check bmp  Constipation D/c colace and prn miralax. Add senna s 2 tab qhs and miralax daily for now, hydration encouraged  Headache Monitor clinically, give tylenol 650 mg q8h prn.   gerd Stable, continue prilosec 20 mg daily  Goals of care: short term rehabilitation   Labs/tests ordered: cbc, cmp  Family/ staff Communication: reviewed care plan with patient and nursing supervisor    Oneal Grout, MD Internal Medicine [facility] [address] [place], [place] [address] Cell Phone (Monday-Friday 8 am - 5 pm): [phone] On Call: [phone] and follow prompts after 5 pm and on weekends Office Phone: [phone] Office Fax: [phone]

## 2015-05-30 ENCOUNTER — Encounter: Payer: Self-pay | Admitting: Adult Health

## 2015-05-30 NOTE — Progress Notes (Signed)
DATE:  05/30/15   MRN:  [id number]  BIRTHDAY: February 12, 1960  Facility:  Nursing Home Location:  [facility]  Nursing Home Room Number: [id number]  LEVEL OF CARE:  SNF [phone](31)  Contact Information    Name Relation Home Work [employer]   Couchman,Shawn Son [phone]  [phone]   Rush,Cora Mother [phone]  [phone]   Myles,Tasha Daughter [phone]  [phone]   No name specified           Code Status History    Date Active Date Inactive Code Status Order ID Comments User Context   04/18/2013  1:09 AM 04/20/2013  4:23 PM Full Code [id number]  Therisa DoyneAnastassia Doutova, MD Inpatient   12/09/2012 11:54 AM 12/11/2012  2:40 PM Full Code [id number]  Karie SodaSteven Gross, MD Inpatient   10/20/2011  2:33 AM 10/20/2011  3:09 PM Full Code [id number]  Vida RollerBrian D Miller, MD ED   01/10/2011  2:04 AM 01/12/2011  7:28 PM Full Code [id number]  Hiram ComberJOLLY, TRAVIS E ED       Chief Complaint  Patient presents with  . Discharge Note    HISTORY OF PRESENT ILLNESS:  This is a 55 year old female who [facility]. She has PMH of hypertension, urinary frequency, PE and ventral hernia. She has osteoarthritis of left knee for which she had left total knee arthroplasty on 05/16/15.   PAST MEDICAL HISTORY:  Past Medical History:  Diagnosis Date  . Acute urinary retention 12/10/2012  . Arthritis   . History of anemia    "while pregnant"  . History of blood clots   . Hypertension    PREVIOUSLY TOOK MED - BP STABLE - TAKEN OFF MED  . Pneumonia 01/09/2011  . Sinus congestion   . Urinary frequency   . Ventral hernia   . Wears glasses      CURRENT MEDICATIONS: Reviewed  Patient's Medications  New Prescriptions   APIXABAN (ELIQUIS) 2.5 MG TABS TABLET    Take 1 tablet (2.5 mg total) by mouth 2 (two) times daily.   METHOCARBAMOL (ROBAXIN) 500 MG TABLET    Take 1 tablet (500 mg total) by mouth 2 (two) times daily with a meal.  Previous Medications   ACETAMINOPHEN (TYLENOL) 500 MG TABLET    Take 500 mg by mouth every 6 (six) hours as needed.    ALBUTEROL (PROVENTIL HFA;VENTOLIN HFA) 108 (90 BASE) MCG/ACT INHALER    Inhale 2 puffs into the lungs every 6 (six) hours as needed for wheezing or shortness of breath.   BECLOMETHASONE (QVAR) 40 MCG/ACT INHALER    Inhale 2 puffs into the lungs every 12 (twelve) hours as needed.    DICLOFENAC SODIUM (VOLTAREN) 1 % GEL    Apply 1 application topically 3 (three) times daily as needed.   FLUTICASONE (FLONASE) 50 MCG/ACT NASAL SPRAY    Place 2 sprays into the nose daily as needed for rhinitis.   HYDROCODONE-ACETAMINOPHEN (NORCO/VICODIN) 5-325 MG TABLET    Take 1-2 tablets by mouth every 4 (four) hours as needed for moderate pain.   POLYETHYLENE GLYCOL (MIRALAX / GLYCOLAX) PACKET    Take 17 g by mouth 2 (two) times daily.   SENNOSIDES-DOCUSATE SODIUM (SENOKOT-S) 8.6-50 MG TABLET    Take 2 tablets by mouth 2 (two) times daily.   TRAMADOL (ULTRAM) 50 MG TABLET    Take 50 mg by mouth every 12 (twelve) hours as needed.  Modified Medications   No medications on file  Discontinued Medications   ACETAMINOPHEN (TYLENOL) 325 MG TABLET    Take by mouth every 8 (eight) hours.   ALBUTEROL (PROVENTIL HFA;VENTOLIN HFA) 108 (90 BASE) MCG/ACT INHALER    Inhale 1-2 puffs into the lungs every 6 (six) hours as needed for wheezing or shortness of breath.   APIXABAN (ELIQUIS) 2.5 MG TABS TABLET    Take 1 tablet (2.5 mg total) by mouth every 12 (twelve) hours.   DOCUSATE SODIUM (COLACE) 100 MG CAPSULE    Take 100 mg by mouth 2 (two) times daily.   FERROUS SULFATE 325 (65 FE) MG TABLET    Take 325 mg by mouth 2 (two) times daily with a meal.   HYDROMORPHONE (DILAUDID) 2 MG TABLET    Take 1 tablet (2 mg total) by mouth every 4 (four) hours as needed for severe pain.   METHOCARBAMOL (ROBAXIN) 500 MG TABLET    Take 1 tablet (500 mg total) by mouth 2 (two) times daily with a meal.   OMEPRAZOLE (PRILOSEC) 20 MG CAPSULE    Take 20 mg by  mouth daily.   POLYETHYLENE GLYCOL (MIRALAX / GLYCOLAX) PACKET    Take 17 g by mouth daily as needed for mild constipation.   POLYETHYLENE GLYCOL (MIRALAX / GLYCOLAX) PACKET    Take 17 g by mouth daily.   PROMETHAZINE (PHENERGAN) 25 MG TABLET    Take 25 mg by mouth every 8 (eight) hours as needed for nausea.   PROTEIN (PROCEL) POWD    Take 2 scoop by mouth 2 (two) times daily.   SENNOSIDES-DOCUSATE SODIUM (SENOKOT-S) 8.6-50 MG TABLET    Take 2 tablets by mouth at bedtime.     Allergies  Allergen Reactions  . Oxycodone Itching and Rash     REVIEW OF SYSTEMS:  GENERAL: no change in appetite, no fatigue, no weight changes, no fever, chills or weakness EYES: Denies change in vision, dry eyes, eye pain, itching or discharge EARS: Denies change in hearing, ringing in ears, or earache NOSE: Denies nasal congestion or epistaxis MOUTH and THROAT: Denies oral discomfort, gingival pain or bleeding, pain from teeth or hoarseness   RESPIRATORY: no cough, SOB, DOE, wheezing, hemoptysis CARDIAC: no chest pain, edema or palpitations GI: no abdominal pain, diarrhea, constipation, heart burn, +nausea  GU: Denies dysuria, frequency, hematuria, incontinence, or discharge PSYCHIATRIC: Denies feeling of depression or anxiety. No report of hallucinations, insomnia, paranoia, or agitation   PHYSICAL EXAMINATION  GENERAL APPEARANCE: Well nourished. In no acute distress. Obese SKIN:  Left knee surgical incision is covered with aquacel dressing and ACE wrap, dry HEAD: Normal in size and contour. No evidence of trauma EYES: Lids open and close normally. No blepharitis, entropion or ectropion. PERRL. Conjunctivae are clear and sclerae are white. Lenses are without opacity EARS: Pinnae are normal. Patient hears normal voice tunes of the examiner MOUTH and THROAT: Lips are without lesions. Oral mucosa is moist and without lesions. Tongue is normal in shape, size, and color and without lesions NECK: supple,  trachea midline, no neck masses, no thyroid tenderness, no thyromegaly LYMPHATICS: no LAN in the neck, no supraclavicular LAN RESPIRATORY: breathing is even & unlabored, BS CTAB CARDIAC: RRR, no murmur,no extra heart sounds, no edema GI: abdomen soft, normal BS, no masses, no tenderness, no hepatomegaly, no splenomegaly EXTREMITIES:  Able to move X 4 extremities PSYCHIATRIC: Alert and oriented X 3. Affect and behavior are appropriate  LABS/RADIOLOGY: Labs reviewed: Basic Metabolic Panel:  Recent Labs  98/11/91 1239 12/06/15 0552 12/12/15  NA 139 131* 136*  K 3.5 4.4 4.4  CL 109 102  --   CO2 23 24  --   GLUCOSE 86 130*  --   BUN 9 11 2*  CREATININE 0.76 0.70 0.6  CALCIUM 8.6* 8.1*  --    CBC:  Recent Labs  11/28/15 1239 12/06/15 0552 12/07/15 0618 12/08/15 0726 12/12/15  WBC 5.4 8.6 9.7 8.1 6.6  NEUTROABS 2.9  --   --   --  4  HGB 11.8* 11.0* 11.7* 10.8* 9.5*  HCT 38.0 34.5* 36.8 34.0* 30*  MCV 92.9 91.0 91.1 91.4  --   PLT 201 165 167 152 254     No results found.  ASSESSMENT/PLAN:   Goals of care:  Short-term rehabilitation   Monina C. Medina-Vargas - NP    [facility] [phone]     This encounter was created in error - please disregard.

## 2015-05-31 DIAGNOSIS — M1712 Unilateral primary osteoarthritis, left knee: Secondary | ICD-10-CM | POA: Diagnosis not present

## 2015-05-31 DIAGNOSIS — M6281 Muscle weakness (generalized): Secondary | ICD-10-CM | POA: Diagnosis not present

## 2015-06-01 DIAGNOSIS — Z96652 Presence of left artificial knee joint: Secondary | ICD-10-CM | POA: Diagnosis not present

## 2015-06-01 DIAGNOSIS — M25662 Stiffness of left knee, not elsewhere classified: Secondary | ICD-10-CM | POA: Diagnosis not present

## 2015-06-01 DIAGNOSIS — M25562 Pain in left knee: Secondary | ICD-10-CM | POA: Diagnosis not present

## 2015-06-08 DIAGNOSIS — Z96652 Presence of left artificial knee joint: Secondary | ICD-10-CM | POA: Diagnosis not present

## 2015-06-08 DIAGNOSIS — M25662 Stiffness of left knee, not elsewhere classified: Secondary | ICD-10-CM | POA: Diagnosis not present

## 2015-06-08 DIAGNOSIS — M25562 Pain in left knee: Secondary | ICD-10-CM | POA: Diagnosis not present

## 2015-06-15 DIAGNOSIS — M25562 Pain in left knee: Secondary | ICD-10-CM | POA: Diagnosis not present

## 2015-06-15 DIAGNOSIS — M25662 Stiffness of left knee, not elsewhere classified: Secondary | ICD-10-CM | POA: Diagnosis not present

## 2015-06-15 DIAGNOSIS — Z96652 Presence of left artificial knee joint: Secondary | ICD-10-CM | POA: Diagnosis not present

## 2015-06-17 DIAGNOSIS — M25562 Pain in left knee: Secondary | ICD-10-CM | POA: Diagnosis not present

## 2015-06-17 DIAGNOSIS — Z96652 Presence of left artificial knee joint: Secondary | ICD-10-CM | POA: Diagnosis not present

## 2015-06-17 DIAGNOSIS — M25662 Stiffness of left knee, not elsewhere classified: Secondary | ICD-10-CM | POA: Diagnosis not present

## 2015-06-21 DIAGNOSIS — M25662 Stiffness of left knee, not elsewhere classified: Secondary | ICD-10-CM | POA: Diagnosis not present

## 2015-06-21 DIAGNOSIS — Z96652 Presence of left artificial knee joint: Secondary | ICD-10-CM | POA: Diagnosis not present

## 2015-06-21 DIAGNOSIS — M25562 Pain in left knee: Secondary | ICD-10-CM | POA: Diagnosis not present

## 2015-06-24 ENCOUNTER — Other Ambulatory Visit (HOSPITAL_COMMUNITY): Payer: Self-pay | Admitting: Orthopaedic Surgery

## 2015-06-24 ENCOUNTER — Ambulatory Visit (HOSPITAL_COMMUNITY)
Admission: RE | Admit: 2015-06-24 | Discharge: 2015-06-24 | Disposition: A | Discharge status: Home / Self Care | Payer: BLUE CROSS/BLUE SHIELD | Source: Ambulatory Visit | Attending: Family Medicine | Admitting: Family Medicine

## 2015-06-24 DIAGNOSIS — Z96652 Presence of left artificial knee joint: Secondary | ICD-10-CM | POA: Insufficient documentation

## 2015-06-24 DIAGNOSIS — R609 Edema, unspecified: Secondary | ICD-10-CM

## 2015-06-24 DIAGNOSIS — Z86711 Personal history of pulmonary embolism: Secondary | ICD-10-CM | POA: Diagnosis not present

## 2015-06-24 DIAGNOSIS — M79662 Pain in left lower leg: Secondary | ICD-10-CM | POA: Insufficient documentation

## 2015-06-24 DIAGNOSIS — M25562 Pain in left knee: Secondary | ICD-10-CM | POA: Diagnosis not present

## 2015-06-24 NOTE — Progress Notes (Addendum)
*  PRELIMINARY RESULTS* Vascular Ultrasound Left lower extremity venous duplex has been completed.  Preliminary findings: Isolated DVT noted in the left peroneal veins, mid calf.    Called results to Dr. Nolon Nationsalldorf's office. Spoke with Darl PikesSusan. Office will call back with instructions for patient.  Greig CastillaAndrew, [place] returned call and instructed patient to pick up Eliquis Rx at pharmacy today.   Farrel DemarkJill Eunice, RDMS, RVT  06/24/2015, 4:11 PM

## 2015-06-28 DIAGNOSIS — Z9889 Other specified postprocedural states: Secondary | ICD-10-CM | POA: Diagnosis not present

## 2015-06-29 DIAGNOSIS — M25662 Stiffness of left knee, not elsewhere classified: Secondary | ICD-10-CM | POA: Diagnosis not present

## 2015-06-29 DIAGNOSIS — M25562 Pain in left knee: Secondary | ICD-10-CM | POA: Diagnosis not present

## 2015-06-29 DIAGNOSIS — Z96652 Presence of left artificial knee joint: Secondary | ICD-10-CM | POA: Diagnosis not present

## 2015-07-06 DIAGNOSIS — Z96652 Presence of left artificial knee joint: Secondary | ICD-10-CM | POA: Diagnosis not present

## 2015-07-06 DIAGNOSIS — M25562 Pain in left knee: Secondary | ICD-10-CM | POA: Diagnosis not present

## 2015-07-06 DIAGNOSIS — M25662 Stiffness of left knee, not elsewhere classified: Secondary | ICD-10-CM | POA: Diagnosis not present

## 2015-07-07 DIAGNOSIS — M25562 Pain in left knee: Secondary | ICD-10-CM | POA: Diagnosis not present

## 2015-07-07 DIAGNOSIS — M25662 Stiffness of left knee, not elsewhere classified: Secondary | ICD-10-CM | POA: Diagnosis not present

## 2015-07-07 DIAGNOSIS — Z96652 Presence of left artificial knee joint: Secondary | ICD-10-CM | POA: Diagnosis not present

## 2015-07-11 DIAGNOSIS — M25662 Stiffness of left knee, not elsewhere classified: Secondary | ICD-10-CM | POA: Diagnosis not present

## 2015-07-11 DIAGNOSIS — Z96652 Presence of left artificial knee joint: Secondary | ICD-10-CM | POA: Diagnosis not present

## 2015-07-11 DIAGNOSIS — M25562 Pain in left knee: Secondary | ICD-10-CM | POA: Diagnosis not present

## 2015-07-13 DIAGNOSIS — M25662 Stiffness of left knee, not elsewhere classified: Secondary | ICD-10-CM | POA: Diagnosis not present

## 2015-07-13 DIAGNOSIS — Z96652 Presence of left artificial knee joint: Secondary | ICD-10-CM | POA: Diagnosis not present

## 2015-07-13 DIAGNOSIS — M25562 Pain in left knee: Secondary | ICD-10-CM | POA: Diagnosis not present

## 2015-07-14 DIAGNOSIS — R0600 Dyspnea, unspecified: Secondary | ICD-10-CM | POA: Diagnosis not present

## 2015-07-14 DIAGNOSIS — I82432 Acute embolism and thrombosis of left popliteal vein: Secondary | ICD-10-CM | POA: Diagnosis not present

## 2015-07-14 DIAGNOSIS — M150 Primary generalized (osteo)arthritis: Secondary | ICD-10-CM | POA: Diagnosis not present

## 2015-07-19 DIAGNOSIS — M25662 Stiffness of left knee, not elsewhere classified: Secondary | ICD-10-CM | POA: Diagnosis not present

## 2015-07-19 DIAGNOSIS — Z96652 Presence of left artificial knee joint: Secondary | ICD-10-CM | POA: Diagnosis not present

## 2015-07-19 DIAGNOSIS — M25562 Pain in left knee: Secondary | ICD-10-CM | POA: Diagnosis not present

## 2015-07-25 DIAGNOSIS — Z96652 Presence of left artificial knee joint: Secondary | ICD-10-CM | POA: Diagnosis not present

## 2015-07-25 DIAGNOSIS — M25562 Pain in left knee: Secondary | ICD-10-CM | POA: Diagnosis not present

## 2015-07-25 DIAGNOSIS — M25662 Stiffness of left knee, not elsewhere classified: Secondary | ICD-10-CM | POA: Diagnosis not present

## 2015-07-27 DIAGNOSIS — Z96652 Presence of left artificial knee joint: Secondary | ICD-10-CM | POA: Diagnosis not present

## 2015-07-27 DIAGNOSIS — M25662 Stiffness of left knee, not elsewhere classified: Secondary | ICD-10-CM | POA: Diagnosis not present

## 2015-07-27 DIAGNOSIS — M25562 Pain in left knee: Secondary | ICD-10-CM | POA: Diagnosis not present

## 2015-08-01 DIAGNOSIS — M25562 Pain in left knee: Secondary | ICD-10-CM | POA: Diagnosis not present

## 2015-08-01 DIAGNOSIS — Z96652 Presence of left artificial knee joint: Secondary | ICD-10-CM | POA: Diagnosis not present

## 2015-08-01 DIAGNOSIS — M25662 Stiffness of left knee, not elsewhere classified: Secondary | ICD-10-CM | POA: Diagnosis not present

## 2015-08-03 DIAGNOSIS — Z96652 Presence of left artificial knee joint: Secondary | ICD-10-CM | POA: Diagnosis not present

## 2015-08-03 DIAGNOSIS — M25662 Stiffness of left knee, not elsewhere classified: Secondary | ICD-10-CM | POA: Diagnosis not present

## 2015-08-03 DIAGNOSIS — M25562 Pain in left knee: Secondary | ICD-10-CM | POA: Diagnosis not present

## 2015-08-08 ENCOUNTER — Other Ambulatory Visit: Payer: Self-pay | Admitting: Family Medicine

## 2015-08-08 DIAGNOSIS — M25562 Pain in left knee: Secondary | ICD-10-CM | POA: Diagnosis not present

## 2015-08-08 DIAGNOSIS — M25662 Stiffness of left knee, not elsewhere classified: Secondary | ICD-10-CM | POA: Diagnosis not present

## 2015-08-08 DIAGNOSIS — Z96652 Presence of left artificial knee joint: Secondary | ICD-10-CM | POA: Diagnosis not present

## 2015-08-08 DIAGNOSIS — Z1231 Encounter for screening mammogram for malignant neoplasm of breast: Secondary | ICD-10-CM

## 2015-08-10 DIAGNOSIS — M25562 Pain in left knee: Secondary | ICD-10-CM | POA: Diagnosis not present

## 2015-08-10 DIAGNOSIS — Z96652 Presence of left artificial knee joint: Secondary | ICD-10-CM | POA: Diagnosis not present

## 2015-08-10 DIAGNOSIS — M25662 Stiffness of left knee, not elsewhere classified: Secondary | ICD-10-CM | POA: Diagnosis not present

## 2015-08-16 DIAGNOSIS — Z471 Aftercare following joint replacement surgery: Secondary | ICD-10-CM | POA: Diagnosis not present

## 2015-08-16 DIAGNOSIS — Z96652 Presence of left artificial knee joint: Secondary | ICD-10-CM | POA: Diagnosis not present

## 2015-08-22 DIAGNOSIS — M25562 Pain in left knee: Secondary | ICD-10-CM | POA: Diagnosis not present

## 2015-08-22 DIAGNOSIS — M25662 Stiffness of left knee, not elsewhere classified: Secondary | ICD-10-CM | POA: Diagnosis not present

## 2015-08-22 DIAGNOSIS — Z96652 Presence of left artificial knee joint: Secondary | ICD-10-CM | POA: Diagnosis not present

## 2015-08-24 ENCOUNTER — Ambulatory Visit
Admission: RE | Admit: 2015-08-24 | Discharge: 2015-08-24 | Disposition: A | Discharge status: Home / Self Care | Payer: BLUE CROSS/BLUE SHIELD | Source: Ambulatory Visit | Attending: Family Medicine | Admitting: Family Medicine

## 2015-08-24 DIAGNOSIS — Z1231 Encounter for screening mammogram for malignant neoplasm of breast: Secondary | ICD-10-CM

## 2015-09-03 ENCOUNTER — Ambulatory Visit (HOSPITAL_COMMUNITY)
Admission: EM | Admit: 2015-09-03 | Discharge: 2015-09-03 | Disposition: A | Discharge status: Home / Self Care | Payer: BLUE CROSS/BLUE SHIELD | Attending: Emergency Medicine | Admitting: Emergency Medicine

## 2015-09-03 ENCOUNTER — Encounter (HOSPITAL_COMMUNITY): Payer: Self-pay | Admitting: *Deleted

## 2015-09-03 DIAGNOSIS — Z7901 Long term (current) use of anticoagulants: Secondary | ICD-10-CM | POA: Insufficient documentation

## 2015-09-03 DIAGNOSIS — M7062 Trochanteric bursitis, left hip: Secondary | ICD-10-CM | POA: Diagnosis not present

## 2015-09-03 DIAGNOSIS — N390 Urinary tract infection, site not specified: Secondary | ICD-10-CM | POA: Diagnosis not present

## 2015-09-03 DIAGNOSIS — Z9889 Other specified postprocedural states: Secondary | ICD-10-CM | POA: Diagnosis not present

## 2015-09-03 DIAGNOSIS — M25552 Pain in left hip: Secondary | ICD-10-CM | POA: Diagnosis present

## 2015-09-03 DIAGNOSIS — Z79899 Other long term (current) drug therapy: Secondary | ICD-10-CM | POA: Insufficient documentation

## 2015-09-03 HISTORY — DX: Personal history of other venous thrombosis and embolism: Z86.718

## 2015-09-03 LAB — POCT URINALYSIS DIP (DEVICE)
Bilirubin Urine: NEGATIVE
Glucose, UA: NEGATIVE mg/dL
Hgb urine dipstick: NEGATIVE
Ketones, ur: NEGATIVE mg/dL
Nitrite: POSITIVE — AB
Protein, ur: NEGATIVE mg/dL
Specific Gravity, Urine: 1.02 (ref 1.005–1.030)
Urobilinogen, UA: 0.2 mg/dL (ref 0.0–1.0)
pH: 7 (ref 5.0–8.0)

## 2015-09-03 MED ORDER — TRIAMCINOLONE ACETONIDE 40 MG/ML IJ SUSP
INTRAMUSCULAR | Status: AC
Start: 2015-09-03 — End: 2015-09-03
  Filled 2015-09-03: qty 1

## 2015-09-03 MED ORDER — LIDOCAINE HCL (PF) 1 % IJ SOLN
INTRAMUSCULAR | Status: AC
  Filled 2015-09-03: qty 30

## 2015-09-03 MED ORDER — NITROFURANTOIN MONOHYD MACRO 100 MG PO CAPS: 100.0000 mg | ORAL_CAPSULE | Freq: Two times a day (BID) | ORAL | 0 refills | Status: AC

## 2015-09-03 NOTE — ED Triage Notes (Signed)
Pt  Reports  Pain  l  Hip  And  l  Upper thigh      Started  Last  Pm  She  Denied  Any  specefic  Injury   She  Reports  Recently        Put  On  Blood  Thinners    No   shortnee  Of breath  No chest  Pain   She   Does  However report  Some  Frequency of  Urination

## 2015-09-03 NOTE — ED Notes (Signed)
Pt  Has  A  Fever  As  well

## 2015-09-05 NOTE — ED Provider Notes (Signed)
CSN: [id number]     Arrival date & time 09/03/15  1547 History   None    Chief Complaint  Patient presents with  . Hip Pain   (Consider location/radiation/quality/duration/timing/severity/associated sxs/prior Treatment) The history is provided by the patient.  Hip Pain  Pertinent negatives include no chest pain and no shortness of breath.  Brandi Rivas is a 55 year old female, presents today for acute onset of left lateral hip pain that started last night without an injury. The pain is constant and sharp, localized at the left lateral upper hip with no radiation. Her pain is worsen with activity and walking but is better if she stays still in one position. She denies numbness, weakness. She has a history of osteoarthritis of her knees and denies having OA of her hip.   She also complaints of dysuria and urinary frequency.   Past Medical History:  Diagnosis Date  . Acute urinary retention 12/10/2012  . Arthritis   . History of anemia    "while pregnant"  . History of blood clots   . Hypertension    PREVIOUSLY TOOK MED - BP STABLE - TAKEN OFF MED  . Pneumonia 01/09/2011  . Sinus congestion   . Urinary frequency   . Ventral hernia   . Wears glasses    Past Surgical History:  Procedure Laterality Date  . ARTHROSCOPY KNEE W/ DRILLING Bilateral   . COLONOSCOPY    . ESOPHAGOGASTRODUODENOSCOPY    . INSERTION OF MESH N/A 12/09/2012   Procedure: INSERTION OF MESH;  Surgeon: Ardeth Sportsman, MD;  Location: [facility];  Service: General;  Laterality: N/A;  . TOTAL KNEE ARTHROPLASTY Left 05/16/2015   Procedure: TOTAL KNEE ARTHROPLASTY;  Surgeon: Gean Birchwood, MD;  Location: [facility];  Service: Orthopedics;  Laterality: Left;  Marland Kitchen VENTRAL HERNIA REPAIR N/A 12/09/2012   Procedure: LAPAROSCOPIC VENTRAL WALL HERNIA REPAIR;  Surgeon: Ardeth Sportsman, MD;  Location: [facility];  Service: General;  Laterality: N/A;   Family History  Problem Relation Age of Onset  . Hypertension Mother   . Diabetes type II  Mother   . Hypertension Father   . Colon cancer Father    Social History  Substance Use Topics  . Smoking status: Never Smoker  . Smokeless tobacco: Never Used  . Alcohol use No   OB History    No data available     Review of Systems  Constitutional: Positive for fatigue and fever. Negative for appetite change, chills and unexpected weight change.  Respiratory: Negative for cough and shortness of breath.   Cardiovascular: Negative for chest pain, palpitations and leg swelling.  Genitourinary: Positive for dysuria and frequency. Negative for flank pain.  Musculoskeletal:       Positive for left hip pain (see HPI)  Neurological: Negative for dizziness, tremors, weakness and numbness.    Allergies  Oxycodone  Home Medications   Prior to Admission medications   Medication Sig Start Date End Date Taking? Authorizing Provider  acetaminophen (TYLENOL) 325 MG tablet Take by mouth every 8 (eight) hours.    Historical Provider, MD  albuterol (PROVENTIL HFA;VENTOLIN HFA) 108 (90 BASE) MCG/ACT inhaler Inhale 1-2 puffs into the lungs every 6 (six) hours as needed for wheezing or shortness of breath. 02/07/14   Jennifer Piepenbrink, PA-C  apixaban (ELIQUIS) 2.5 MG TABS tablet Take 1 tablet (2.5 mg total) by mouth every 12 (twelve) hours. 05/18/15   Allena Katz, PA-C  beclomethasone (QVAR) 40 MCG/ACT inhaler Inhale 2 puffs into the lungs  every 12 (twelve) hours.     Historical Provider, MD  ferrous sulfate 325 (65 FE) MG tablet Take 325 mg by mouth 2 (two) times daily with a meal.    Historical Provider, MD  fluticasone (FLONASE) 50 MCG/ACT nasal spray Place 2 sprays into the nose daily as needed for rhinitis.    Historical Provider, MD  HYDROmorphone (DILAUDID) 2 MG tablet Take 1 tablet (2 mg total) by mouth every 4 (four) hours as needed for severe pain. 05/16/15   Allena Katz, PA-C  methocarbamol (ROBAXIN) 500 MG tablet Take 1 tablet (500 mg total) by mouth 2 (two) times daily with a  meal. 05/16/15   Allena Katz, PA-C  nitrofurantoin, macrocrystal-monohydrate, (MACROBID) 100 MG capsule Take 1 capsule (100 mg total) by mouth 2 (two) times daily. 09/03/15 09/08/15  Lucia Estelle, NP  omeprazole (PRILOSEC) 20 MG capsule Take 20 mg by mouth daily.    Historical Provider, MD  polyethylene glycol (MIRALAX / GLYCOLAX) packet Take 17 g by mouth daily.    Historical Provider, MD  promethazine (PHENERGAN) 25 MG tablet Take 25 mg by mouth every 8 (eight) hours as needed for nausea.    Historical Provider, MD  Protein (PROCEL) POWD Take 2 scoop by mouth 2 (two) times daily.    Historical Provider, MD  sennosides-docusate sodium (SENOKOT-S) 8.6-50 MG tablet Take 2 tablets by mouth at bedtime.    Historical Provider, MD   Meds Ordered and Administered this Visit  Medications - No data to display  BP 117/82 (BP Location: Left Arm)   Pulse 91   Temp 100.1 F (37.8 C) (Oral)   Resp 12   LMP 08/10/2015   SpO2 98%  No data found.   Physical Exam  Constitutional: She is oriented to person, place, and time. She appears well-developed and well-nourished.  HENT:  Head: Normocephalic and atraumatic.  Cardiovascular: Normal rate, regular rhythm and normal heart sounds.   Pulmonary/Chest: Effort normal and breath sounds normal.  Abdominal: Soft. Bowel sounds are normal. There is no tenderness.  Musculoskeletal:  Bilateral hip full ROM, no pain at the hip joint bilaterally. Tender to palpate at one particular spot over the left lateral upper hip, this pain is reproducible with hip flexion, internal and external rotation. No swelling or any other abnormality noted over this area of pain. Patient able to pinpoint the exact tender spot  Neurological: She is alert and oriented to person, place, and time.    Urgent Care Course   Clinical Course    Trochanteric Bursa Injection Date/Time: 09/03/2015 7:15 PM Performed by: Lucia Estelle Authorized by: Charm Rings  Consent: Verbal consent  obtained. Risks and benefits: risks, benefits and alternatives were discussed Consent given by: patient Patient understanding: patient states understanding of the procedure being performed Patient identity confirmed: verbally with patient Local anesthesia used: no  Anesthesia: Local anesthesia used: no  Sedation: Patient sedated: no Patient tolerance: Patient tolerated the procedure well with no immediate complications Comments: Medication used: Kenalog  (1cc) mixed with Lidocaine 1% (2cc)    (including critical care time)  Labs Review Labs Reviewed  POCT URINALYSIS DIP (DEVICE) - Abnormal; Notable for the following:       Result Value   Nitrite POSITIVE (*)    Leukocytes, UA SMALL (*)    All other components within normal limits  URINE CULTURE    Imaging Review No results found.   Visual Acuity Review  Right Eye Distance:   Left Eye Distance:  Bilateral Distance:    Right Eye Near:   Left Eye Near:    Bilateral Near:         MDM   1. Trochanteric bursitis of left hip   2. UTI (lower urinary tract infection)    1) Trochanteric bursitis is suspected. Patient educated that an xray is not indicated. Trochanteric bursa injection was performed. Patient advised to follow up with primary care doctor if she does not improve. Discharge instruction given.   2) UTI also suspect with pos nitrite in urine. Abx given in prescription. Patient informed to follow up with PCP if she does not improve as well.      Lucia Estelle, NP 09/05/15 1259

## 2015-09-06 LAB — URINE CULTURE: Culture: >=100000 — AB

## 2015-09-13 DIAGNOSIS — I82432 Acute embolism and thrombosis of left popliteal vein: Secondary | ICD-10-CM | POA: Diagnosis not present

## 2015-10-31 ENCOUNTER — Other Ambulatory Visit: Payer: Self-pay | Admitting: Orthopedic Surgery

## 2015-11-15 DIAGNOSIS — Z96652 Presence of left artificial knee joint: Secondary | ICD-10-CM | POA: Diagnosis not present

## 2015-11-15 DIAGNOSIS — M1711 Unilateral primary osteoarthritis, right knee: Secondary | ICD-10-CM | POA: Diagnosis not present

## 2015-11-15 DIAGNOSIS — M1712 Unilateral primary osteoarthritis, left knee: Secondary | ICD-10-CM | POA: Diagnosis not present

## 2015-11-15 DIAGNOSIS — Z09 Encounter for follow-up examination after completed treatment for conditions other than malignant neoplasm: Secondary | ICD-10-CM | POA: Diagnosis not present

## 2015-11-28 ENCOUNTER — Encounter (HOSPITAL_COMMUNITY)
Admission: RE | Admit: 2015-11-28 | Discharge: 2015-11-28 | Disposition: A | Discharge status: Home / Self Care | Payer: BLUE CROSS/BLUE SHIELD | Source: Ambulatory Visit | Attending: Orthopedic Surgery | Admitting: Orthopedic Surgery

## 2015-11-28 ENCOUNTER — Encounter (HOSPITAL_COMMUNITY): Payer: Self-pay

## 2015-11-28 DIAGNOSIS — Z01818 Encounter for other preprocedural examination: Secondary | ICD-10-CM | POA: Diagnosis not present

## 2015-11-28 LAB — CBC WITH DIFFERENTIAL/PLATELET
Basophils Absolute: 0.1 10*3/uL (ref 0.0–0.1)
Basophils Relative: 2 %
Eosinophils Absolute: 0.1 10*3/uL (ref 0.0–0.7)
Eosinophils Relative: 2 %
HCT: 38 % (ref 36.0–46.0)
Hemoglobin: 11.8 g/dL — ABNORMAL LOW (ref 12.0–15.0)
Lymphocytes Relative: 35 %
Lymphs Abs: 1.9 10*3/uL (ref 0.7–4.0)
MCH: 28.9 pg (ref 26.0–34.0)
MCHC: 31.1 g/dL (ref 30.0–36.0)
MCV: 92.9 fL (ref 78.0–100.0)
Monocytes Absolute: 0.5 10*3/uL (ref 0.1–1.0)
Monocytes Relative: 9 %
Neutro Abs: 2.9 10*3/uL (ref 1.7–7.7)
Neutrophils Relative %: 52 %
Platelets: 201 10*3/uL (ref 150–400)
RBC: 4.09 MIL/uL (ref 3.87–5.11)
RDW: 12.6 % (ref 11.5–15.5)
WBC: 5.4 10*3/uL (ref 4.0–10.5)

## 2015-11-28 LAB — TYPE AND SCREEN
ABO/RH(D): O POS
Antibody Screen: NEGATIVE

## 2015-11-28 LAB — BASIC METABOLIC PANEL
Anion gap: 7 (ref 5–15)
BUN: 9 mg/dL (ref 6–20)
CO2: 23 mmol/L (ref 22–32)
Calcium: 8.6 mg/dL — ABNORMAL LOW (ref 8.9–10.3)
Chloride: 109 mmol/L (ref 101–111)
Creatinine, Ser: 0.76 mg/dL (ref 0.44–1.00)
GFR calc Af Amer: >60 mL/min (ref >60)
GFR calc non Af Amer: >60 mL/min (ref >60)
Glucose, Bld: 86 mg/dL (ref 65–99)
Potassium: 3.5 mmol/L (ref 3.5–5.1)
Sodium: 139 mmol/L (ref 135–145)

## 2015-11-28 LAB — PROTIME-INR
INR: 1.04
Prothrombin Time: 13.6 seconds (ref 11.4–15.2)

## 2015-11-28 LAB — SURGICAL PCR SCREEN
MRSA, PCR: NEGATIVE
Staphylococcus aureus: POSITIVE — AB

## 2015-11-28 LAB — APTT: aPTT: 30 seconds (ref 24–36)

## 2015-11-28 LAB — HCG, SERUM, QUALITATIVE: Preg, Serum: NEGATIVE

## 2015-11-28 NOTE — Progress Notes (Signed)
PCP - L. Lupe Carneyean Mitchell Cardiologist - denies  Chest x-ray - not needed EKG - 05/04/15 NSR Stress Test - denies ECHO - 04/18/13 Cardiac Cath - denies    Patient denies shortness of breath, fever, cough and chest pain at PAT appointment

## 2015-11-28 NOTE — Progress Notes (Signed)
Patient positive for staph, called in prescription in at [facility] [phone], PCR positive for staph patient verbalized understanding and will use ointment

## 2015-11-28 NOTE — Pre-Procedure Instructions (Signed)
Brandi Rivas  11/28/2015      [facility] [address]- [place], [place] - [id number] [place] [id number] Phone: [phone] Fax: [phone]    Your procedure is scheduled on October 30  [facility] at 1000 A.M.  Call this number if you have problems the morning of surgery:  [phone]   Remember:  Do not eat food or drink liquids after midnight.   Take these medicines the morning of surgery with A SIP OF WATER acetaminophen (TYLENOL),  albuterol (PROVENTIL HFA;VENTOLIN HFA),beclomethasone (QVAR), fluticasone (FLONASE) ,  HYDROcodone-acetaminophen (NORCO/VICODIN)   7 days prior to surgery STOP taking any Aspirin, Aleve, Naproxen, Ibuprofen, Motrin, Advil, Goody's, BC's, all herbal medications, fish oil, and all vitamins    Do not wear jewelry, make-up or nail polish.  Do not wear lotions, powders, or perfumes, or deoderant.  Do not shave 48 hours prior to surgery.    Do not bring valuables to the hospital.  [facility] is not responsible for any belongings or valuables.  Contacts, dentures or bridgework may not be worn into surgery.  Leave your suitcase in the car.  After surgery it may be brought to your room.  For patients admitted to the hospital, discharge time will be determined by your treatment team.  Patients discharged the day of surgery will not be allowed to drive home.   Special instructions:   [place]- Preparing For Surgery  Before surgery, you can play an important role. Because skin is not sterile, your skin needs to be as free of germs as possible. You can reduce the number of germs on your skin by washing with CHG (chlorahexidine gluconate) Soap before surgery.  CHG is an antiseptic cleaner which kills germs and bonds with the skin to continue killing germs even after washing.  Please do not use if you have an allergy to CHG or antibacterial soaps. If your skin becomes  reddened/irritated stop using the CHG.  Do not shave (including legs and underarms) for at least 48 hours prior to first CHG shower. It is OK to shave your face.  Please follow these instructions carefully.   1. Shower the NIGHT BEFORE SURGERY and the MORNING OF SURGERY with CHG.   2. If you chose to wash your hair, wash your hair first as usual with your normal shampoo.  3. After you shampoo, rinse your hair and body thoroughly to remove the shampoo.  4. Use CHG as you would any other liquid soap. You can apply CHG directly to the skin and wash gently with a scrungie or a clean washcloth.   5. Apply the CHG Soap to your body ONLY FROM THE NECK DOWN.  Do not use on open wounds or open sores. Avoid contact with your eyes, ears, mouth and genitals (private parts). Wash genitals (private parts) with your normal soap.  6. Wash thoroughly, paying special attention to the area where your surgery will be performed.  7. Thoroughly rinse your body with warm water from the neck down.  8. DO NOT shower/wash with your normal soap after using and rinsing off the CHG Soap.  9. Pat yourself dry with a CLEAN TOWEL.   10. Wear CLEAN PAJAMAS   11. Place CLEAN SHEETS on your bed the night of your first shower and DO NOT SLEEP WITH PETS.    Day of Surgery: Do not apply any deodorants/lotions. Please wear clean clothes to the hospital/surgery  center.      Please read over the following fact sheets that you were given.

## 2015-12-01 NOTE — H&P (Signed)
TOTAL KNEE ADMISSION H&P  Patient is being admitted for right total knee arthroplasty.  Subjective:  Chief Complaint:right knee pain.  HPI: Brandi Rivas, 55 y.o. female, has a history of pain and functional disability in the right knee due to arthritis and has failed non-surgical conservative treatments for greater than 12 weeks to includeNSAID's and/or analgesics, corticosteriod injections, viscosupplementation injections, flexibility and strengthening excercises, supervised PT with diminished ADL's post treatment, use of assistive devices and activity modification.  Onset of symptoms was gradual, starting 2 years ago with gradually worsening course since that time. The patient noted no past surgery on the right knee(s).  Patient currently rates pain in the right knee(s) at 10 out of 10 with activity. Patient has night pain, worsening of pain with activity and weight bearing, pain that interferes with activities of daily living, pain with passive range of motion and crepitus.  Patient has evidence of joint space narrowing by imaging studies.   There is no active infection.  Patient Active Problem List   Diagnosis Date Noted  . Primary osteoarthritis of knee 05/16/2015  . Primary osteoarthritis of left knee 05/14/2015  . Pulmonary emboli (HCC) 04/18/2013  . Pulmonary embolus (HCC) 04/17/2013  . Constipation, chronic 12/29/2012  . Hypertension   . Ventral hernia - periumbilical - s/p lap repair 12/09/2012 11/03/2012  . Obesity (BMI 30-39.9) 11/03/2012   Past Medical History:  Diagnosis Date  . Acute urinary retention 12/10/2012  . Arthritis   . History of anemia    "while pregnant"  . History of blood clots   . Hypertension    PREVIOUSLY TOOK MED - BP STABLE - TAKEN OFF MED  . Pneumonia 01/09/2011  . Sinus congestion   . Urinary frequency   . Ventral hernia   . Wears glasses     Past Surgical History:  Procedure Laterality Date  . ARTHROSCOPY KNEE W/ DRILLING Bilateral   .  COLONOSCOPY    . ESOPHAGOGASTRODUODENOSCOPY    . INSERTION OF MESH N/A 12/09/2012   Procedure: INSERTION OF MESH;  Surgeon: Ardeth Sportsman, MD;  Location: [facility];  Service: General;  Laterality: N/A;  . TOTAL KNEE ARTHROPLASTY Left 05/16/2015   Procedure: TOTAL KNEE ARTHROPLASTY;  Surgeon: Gean Birchwood, MD;  Location: [facility];  Service: Orthopedics;  Laterality: Left;  Marland Kitchen VENTRAL HERNIA REPAIR N/A 12/09/2012   Procedure: LAPAROSCOPIC VENTRAL WALL HERNIA REPAIR;  Surgeon: Ardeth Sportsman, MD;  Location: [facility];  Service: General;  Laterality: N/A;    No prescriptions prior to admission.   Allergies  Allergen Reactions  . Oxycodone Itching and Rash    Social History  Substance Use Topics  . Smoking status: Never Smoker  . Smokeless tobacco: Never Used  . Alcohol use No    Family History  Problem Relation Age of Onset  . Hypertension Mother   . Diabetes type II Mother   . Hypertension Father   . Colon cancer Father      Review of Systems  Constitutional: Negative.   HENT: Negative.   Eyes: Negative.   Respiratory: Negative.   Cardiovascular: Negative.   Gastrointestinal: Negative.   Genitourinary: Negative.   Musculoskeletal: Positive for joint pain.  Skin: Negative.   Neurological: Negative.   Endo/Heme/Allergies:       History of PE  Psychiatric/Behavioral: Negative.     Objective:  Physical Exam  Constitutional: She is oriented to person, place, and time. She appears well-developed and well-nourished.  HENT:  Head: Normocephalic and atraumatic.  Eyes: Pupils are equal, round, and reactive to light.  Neck: Normal range of motion. Neck supple.  Cardiovascular: Intact distal pulses.   Respiratory: Effort normal.  Musculoskeletal: She exhibits tenderness.  She continues to have a range from 5 to 125 .  Continued tenderness over the medial joint line.    Neurological: She is alert and oriented to person, place, and time.  Skin: Skin is warm and dry.  Psychiatric: She  has a normal mood and affect. Her behavior is normal. Judgment and thought content normal.    Vital signs in last 24 hours:    Labs:   Estimated body mass index is 36.73 kg/m as calculated from the following:   Height as of 11/28/15: 5\' 6"  (1.676 m).   Weight as of 11/28/15: 103.2 kg (227 lb 9 oz).   Imaging Review Plain radiographs demonstrate  AP, lateral and sunrise x-rays of the left total knee show well-placed well fixed prosthesis no evidence of loosening no change in comparison to x-rays a redundant 6 months ago.  The right knee is bone-on-bone to the medial compartment.  Assessment/Plan:  End stage arthritis, right knee   The patient history, physical examination, clinical judgment of the provider and imaging studies are consistent with end stage degenerative joint disease of the right knee(s) and total knee arthroplasty is deemed medically necessary. The treatment options including medical management, injection therapy arthroscopy and arthroplasty were discussed at length. The risks and benefits of total knee arthroplasty were presented and reviewed. The risks due to aseptic loosening, infection, stiffness, patella tracking problems, thromboembolic complications and other imponderables were discussed. The patient acknowledged the explanation, agreed to proceed with the plan and consent was signed. Patient is being admitted for inpatient treatment for surgery, pain control, PT, OT, prophylactic antibiotics, VTE prophylaxis, progressive ambulation and ADL's and discharge planning. The patient is planning to be discharged home with home health services

## 2015-12-02 DIAGNOSIS — M1711 Unilateral primary osteoarthritis, right knee: Secondary | ICD-10-CM | POA: Diagnosis present

## 2015-12-05 ENCOUNTER — Inpatient Hospital Stay (HOSPITAL_COMMUNITY): Payer: BLUE CROSS/BLUE SHIELD | Admitting: Certified Registered Nurse Anesthetist

## 2015-12-05 ENCOUNTER — Encounter (HOSPITAL_COMMUNITY): Payer: Self-pay | Admitting: Certified Registered Nurse Anesthetist

## 2015-12-05 ENCOUNTER — Encounter (HOSPITAL_COMMUNITY)
Admission: RE | Disposition: A | Discharge status: Skilled Nursing Facility | Payer: Self-pay | Source: Ambulatory Visit | Attending: Orthopedic Surgery

## 2015-12-05 ENCOUNTER — Inpatient Hospital Stay (HOSPITAL_COMMUNITY)
Admission: RE | Admit: 2015-12-05 | Discharge: 2015-12-08 | DRG: 470 | Disposition: A | Discharge status: Skilled Nursing Facility | Payer: BLUE CROSS/BLUE SHIELD | Source: Ambulatory Visit | Attending: Orthopedic Surgery | Admitting: Orthopedic Surgery

## 2015-12-05 DIAGNOSIS — R2689 Other abnormalities of gait and mobility: Secondary | ICD-10-CM | POA: Diagnosis not present

## 2015-12-05 DIAGNOSIS — Z885 Allergy status to narcotic agent status: Secondary | ICD-10-CM

## 2015-12-05 DIAGNOSIS — Z6836 Body mass index (BMI) 36.0-36.9, adult: Secondary | ICD-10-CM | POA: Diagnosis not present

## 2015-12-05 DIAGNOSIS — M1990 Unspecified osteoarthritis, unspecified site: Secondary | ICD-10-CM | POA: Diagnosis not present

## 2015-12-05 DIAGNOSIS — Z8249 Family history of ischemic heart disease and other diseases of the circulatory system: Secondary | ICD-10-CM

## 2015-12-05 DIAGNOSIS — Z7901 Long term (current) use of anticoagulants: Secondary | ICD-10-CM

## 2015-12-05 DIAGNOSIS — D62 Acute posthemorrhagic anemia: Secondary | ICD-10-CM | POA: Diagnosis not present

## 2015-12-05 DIAGNOSIS — M179 Osteoarthritis of knee, unspecified: Secondary | ICD-10-CM | POA: Diagnosis not present

## 2015-12-05 DIAGNOSIS — M1711 Unilateral primary osteoarthritis, right knee: Principal | ICD-10-CM | POA: Diagnosis present

## 2015-12-05 DIAGNOSIS — E669 Obesity, unspecified: Secondary | ICD-10-CM | POA: Diagnosis present

## 2015-12-05 DIAGNOSIS — Z471 Aftercare following joint replacement surgery: Secondary | ICD-10-CM | POA: Diagnosis not present

## 2015-12-05 DIAGNOSIS — Z86718 Personal history of other venous thrombosis and embolism: Secondary | ICD-10-CM

## 2015-12-05 DIAGNOSIS — Z7982 Long term (current) use of aspirin: Secondary | ICD-10-CM | POA: Diagnosis not present

## 2015-12-05 DIAGNOSIS — Z96652 Presence of left artificial knee joint: Secondary | ICD-10-CM | POA: Diagnosis present

## 2015-12-05 DIAGNOSIS — I10 Essential (primary) hypertension: Secondary | ICD-10-CM | POA: Diagnosis present

## 2015-12-05 DIAGNOSIS — Z86711 Personal history of pulmonary embolism: Secondary | ICD-10-CM | POA: Diagnosis not present

## 2015-12-05 DIAGNOSIS — Z833 Family history of diabetes mellitus: Secondary | ICD-10-CM | POA: Diagnosis not present

## 2015-12-05 DIAGNOSIS — M25561 Pain in right knee: Secondary | ICD-10-CM | POA: Diagnosis not present

## 2015-12-05 DIAGNOSIS — G8918 Other acute postprocedural pain: Secondary | ICD-10-CM | POA: Diagnosis not present

## 2015-12-05 DIAGNOSIS — Z800 Family history of malignant neoplasm of digestive organs: Secondary | ICD-10-CM | POA: Diagnosis not present

## 2015-12-05 DIAGNOSIS — M6281 Muscle weakness (generalized): Secondary | ICD-10-CM | POA: Diagnosis not present

## 2015-12-05 DIAGNOSIS — Z96651 Presence of right artificial knee joint: Secondary | ICD-10-CM | POA: Diagnosis not present

## 2015-12-05 HISTORY — PX: TOTAL KNEE ARTHROPLASTY: SHX125

## 2015-12-05 SURGERY — ARTHROPLASTY, KNEE, TOTAL
Anesthesia: Regional | Site: Knee | Laterality: Right

## 2015-12-05 MED ORDER — BISACODYL 5 MG PO TBEC: 5.0000 mg | DELAYED_RELEASE_TABLET | Freq: Every day | ORAL | Status: DC | PRN

## 2015-12-05 MED ORDER — FENTANYL CITRATE (PF) 100 MCG/2ML IJ SOLN
INTRAMUSCULAR | Status: DC | PRN
  Administered 2015-12-05 (×2): 50 ug via INTRAVENOUS

## 2015-12-05 MED ORDER — CHLORHEXIDINE GLUCONATE 4 % EX LIQD: 60.0000 mL | Freq: Once | CUTANEOUS | Status: DC

## 2015-12-05 MED ORDER — SODIUM CHLORIDE 0.9 % IJ SOLN
INTRAMUSCULAR | Status: DC | PRN
  Administered 2015-12-05: 20 mL

## 2015-12-05 MED ORDER — LACTATED RINGERS IV SOLN
INTRAVENOUS | Status: DC
  Administered 2015-12-05 (×2): via INTRAVENOUS

## 2015-12-05 MED ORDER — HYDROMORPHONE HCL 2 MG/ML IJ SOLN
0.5000 mg | INTRAMUSCULAR | Status: DC | PRN
  Administered 2015-12-05 – 2015-12-06 (×2): 1 mg via INTRAVENOUS
  Filled 2015-12-05 (×2): qty 1

## 2015-12-05 MED ORDER — MENTHOL 3 MG MT LOZG: 1.0000 | LOZENGE | OROMUCOSAL | Status: DC | PRN

## 2015-12-05 MED ORDER — MIDAZOLAM HCL 2 MG/2ML IJ SOLN
INTRAMUSCULAR | Status: AC
  Administered 2015-12-05: 2 mg via INTRAVENOUS
  Filled 2015-12-05: qty 2

## 2015-12-05 MED ORDER — ALUM & MAG HYDROXIDE-SIMETH 200-200-20 MG/5ML PO SUSP: 30.0000 mL | ORAL | Status: DC | PRN

## 2015-12-05 MED ORDER — CEFAZOLIN SODIUM-DEXTROSE 2-4 GM/100ML-% IV SOLN
2.0000 g | INTRAVENOUS | Status: AC
  Administered 2015-12-05: 2 g via INTRAVENOUS

## 2015-12-05 MED ORDER — ALBUTEROL SULFATE (2.5 MG/3ML) 0.083% IN NEBU: 3.0000 mL | INHALATION_SOLUTION | Freq: Four times a day (QID) | RESPIRATORY_TRACT | Status: DC | PRN

## 2015-12-05 MED ORDER — PHENYLEPHRINE HCL 10 MG/ML IJ SOLN
INTRAMUSCULAR | Status: AC
Start: 2015-12-05 — End: 2015-12-05
  Filled 2015-12-05: qty 1

## 2015-12-05 MED ORDER — ACETAMINOPHEN 325 MG PO TABS: 650.0000 mg | ORAL_TABLET | Freq: Four times a day (QID) | ORAL | Status: DC | PRN

## 2015-12-05 MED ORDER — BUPIVACAINE-EPINEPHRINE 0.5% -1:200000 IJ SOLN
INTRAMUSCULAR | Status: DC | PRN
  Administered 2015-12-05: 20 mL

## 2015-12-05 MED ORDER — ONDANSETRON HCL 4 MG/2ML IJ SOLN
4.0000 mg | Freq: Four times a day (QID) | INTRAMUSCULAR | Status: DC | PRN
  Administered 2015-12-05: 4 mg via INTRAVENOUS
  Filled 2015-12-05: qty 2

## 2015-12-05 MED ORDER — METOCLOPRAMIDE HCL 5 MG/ML IJ SOLN: 5.0000 mg | Freq: Three times a day (TID) | INTRAMUSCULAR | Status: DC | PRN

## 2015-12-05 MED ORDER — FLUTICASONE PROPIONATE 50 MCG/ACT NA SUSP: 2.0000 | Freq: Every day | NASAL | Status: DC | PRN

## 2015-12-05 MED ORDER — PROMETHAZINE HCL 25 MG/ML IJ SOLN: 6.2500 mg | INTRAMUSCULAR | Status: DC | PRN

## 2015-12-05 MED ORDER — METHOCARBAMOL 1000 MG/10ML IJ SOLN
500.0000 mg | Freq: Four times a day (QID) | INTRAVENOUS | Status: DC | PRN
  Filled 2015-12-05: qty 5

## 2015-12-05 MED ORDER — HYDROCODONE-ACETAMINOPHEN 10-325 MG PO TABS
1.0000 | ORAL_TABLET | ORAL | Status: DC | PRN
  Administered 2015-12-05 – 2015-12-08 (×11): 2 via ORAL
  Filled 2015-12-05 (×13): qty 2

## 2015-12-05 MED ORDER — MIDAZOLAM HCL 5 MG/5ML IJ SOLN
INTRAMUSCULAR | Status: DC | PRN
  Administered 2015-12-05: 2 mg via INTRAVENOUS

## 2015-12-05 MED ORDER — ROPIVACAINE HCL 7.5 MG/ML IJ SOLN
INTRAMUSCULAR | Status: DC | PRN
  Administered 2015-12-05: 20 mL via PERINEURAL

## 2015-12-05 MED ORDER — MIDAZOLAM HCL 2 MG/2ML IJ SOLN
2.0000 mg | Freq: Once | INTRAMUSCULAR | Status: AC
  Administered 2015-12-05: 2 mg via INTRAVENOUS

## 2015-12-05 MED ORDER — TRANEXAMIC ACID 1000 MG/10ML IV SOLN
2000.0000 mg | Freq: Once | INTRAVENOUS | Status: AC
  Administered 2015-12-05: 2000 mg via TOPICAL
  Filled 2015-12-05: qty 20

## 2015-12-05 MED ORDER — PROPOFOL 500 MG/50ML IV EMUL
INTRAVENOUS | Status: DC | PRN
  Administered 2015-12-05: 100 ug/kg/min via INTRAVENOUS

## 2015-12-05 MED ORDER — METHOCARBAMOL 500 MG PO TABS: 500.0000 mg | ORAL_TABLET | Freq: Two times a day (BID) | ORAL | 0 refills | Status: DC

## 2015-12-05 MED ORDER — ONDANSETRON HCL 4 MG PO TABS
4.0000 mg | ORAL_TABLET | Freq: Four times a day (QID) | ORAL | Status: DC | PRN
  Administered 2015-12-06: 4 mg via ORAL
  Filled 2015-12-05: qty 1

## 2015-12-05 MED ORDER — DIPHENHYDRAMINE HCL 12.5 MG/5ML PO ELIX: 12.5000 mg | ORAL_SOLUTION | ORAL | Status: DC | PRN

## 2015-12-05 MED ORDER — PHENOL 1.4 % MT LIQD: 1.0000 | OROMUCOSAL | Status: DC | PRN

## 2015-12-05 MED ORDER — DOCUSATE SODIUM 100 MG PO CAPS
100.0000 mg | ORAL_CAPSULE | Freq: Two times a day (BID) | ORAL | Status: DC
  Administered 2015-12-06 – 2015-12-08 (×6): 100 mg via ORAL
  Filled 2015-12-05 (×6): qty 1

## 2015-12-05 MED ORDER — FENTANYL CITRATE (PF) 100 MCG/2ML IJ SOLN
INTRAMUSCULAR | Status: AC
  Filled 2015-12-05: qty 2

## 2015-12-05 MED ORDER — APIXABAN 2.5 MG PO TABS: 2.5000 mg | ORAL_TABLET | Freq: Two times a day (BID) | ORAL | 0 refills | Status: DC

## 2015-12-05 MED ORDER — APIXABAN 2.5 MG PO TABS
2.5000 mg | ORAL_TABLET | Freq: Two times a day (BID) | ORAL | Status: DC
  Administered 2015-12-06 – 2015-12-08 (×5): 2.5 mg via ORAL
  Filled 2015-12-05 (×5): qty 1

## 2015-12-05 MED ORDER — FLEET ENEMA 7-19 GM/118ML RE ENEM: 1.0000 | ENEMA | Freq: Once | RECTAL | Status: DC | PRN

## 2015-12-05 MED ORDER — HYDROMORPHONE HCL 1 MG/ML IJ SOLN
0.2500 mg | INTRAMUSCULAR | Status: DC | PRN
  Administered 2015-12-05 (×2): 0.5 mg via INTRAVENOUS

## 2015-12-05 MED ORDER — CEFAZOLIN SODIUM-DEXTROSE 2-4 GM/100ML-% IV SOLN
INTRAVENOUS | Status: AC
  Filled 2015-12-05: qty 100

## 2015-12-05 MED ORDER — FENTANYL CITRATE (PF) 100 MCG/2ML IJ SOLN
INTRAMUSCULAR | Status: AC
  Administered 2015-12-05: 50 ug via INTRAVENOUS
  Filled 2015-12-05: qty 2

## 2015-12-05 MED ORDER — ACETAMINOPHEN 650 MG RE SUPP: 650.0000 mg | Freq: Four times a day (QID) | RECTAL | Status: DC | PRN

## 2015-12-05 MED ORDER — METHOCARBAMOL 500 MG PO TABS
500.0000 mg | ORAL_TABLET | Freq: Four times a day (QID) | ORAL | Status: DC | PRN
  Administered 2015-12-07 (×3): 500 mg via ORAL
  Filled 2015-12-05 (×3): qty 1

## 2015-12-05 MED ORDER — BUPIVACAINE LIPOSOME 1.3 % IJ SUSP
INTRAMUSCULAR | Status: DC | PRN
  Administered 2015-12-05: 20 mL

## 2015-12-05 MED ORDER — MEPERIDINE HCL 25 MG/ML IJ SOLN: 6.2500 mg | INTRAMUSCULAR | Status: DC | PRN

## 2015-12-05 MED ORDER — FENTANYL CITRATE (PF) 100 MCG/2ML IJ SOLN
50.0000 ug | Freq: Once | INTRAMUSCULAR | Status: AC
  Administered 2015-12-05: 50 ug via INTRAVENOUS

## 2015-12-05 MED ORDER — SODIUM CHLORIDE 0.9 % IR SOLN
Status: DC | PRN
  Administered 2015-12-05: 3000 mL

## 2015-12-05 MED ORDER — SENNOSIDES-DOCUSATE SODIUM 8.6-50 MG PO TABS
1.0000 | ORAL_TABLET | Freq: Every evening | ORAL | Status: DC | PRN
  Administered 2015-12-07: 1 via ORAL
  Filled 2015-12-05: qty 1

## 2015-12-05 MED ORDER — MIDAZOLAM HCL 2 MG/2ML IJ SOLN
INTRAMUSCULAR | Status: AC
  Filled 2015-12-05: qty 2

## 2015-12-05 MED ORDER — DEXTROSE-NACL 5-0.45 % IV SOLN: INTRAVENOUS | Status: DC

## 2015-12-05 MED ORDER — HYDROCODONE-ACETAMINOPHEN 5-325 MG PO TABS: 1.0000 | ORAL_TABLET | ORAL | 0 refills | Status: DC | PRN

## 2015-12-05 MED ORDER — HYDROMORPHONE HCL 1 MG/ML IJ SOLN
INTRAMUSCULAR | Status: AC
  Filled 2015-12-05: qty 0.5

## 2015-12-05 MED ORDER — METOCLOPRAMIDE HCL 5 MG PO TABS: 5.0000 mg | ORAL_TABLET | Freq: Three times a day (TID) | ORAL | Status: DC | PRN

## 2015-12-05 MED ORDER — BUDESONIDE 0.25 MG/2ML IN SUSP
0.2500 mg | Freq: Two times a day (BID) | RESPIRATORY_TRACT | Status: DC
  Administered 2015-12-05 – 2015-12-08 (×6): 0.25 mg via RESPIRATORY_TRACT
  Filled 2015-12-05 (×7): qty 2

## 2015-12-05 MED ORDER — BUPIVACAINE IN DEXTROSE 0.75-8.25 % IT SOLN
INTRATHECAL | Status: DC | PRN
  Administered 2015-12-05: 2 mL via INTRATHECAL

## 2015-12-05 MED ORDER — TRAMADOL HCL 50 MG PO TABS
50.0000 mg | ORAL_TABLET | Freq: Two times a day (BID) | ORAL | Status: DC | PRN
  Administered 2015-12-06 – 2015-12-08 (×3): 50 mg via ORAL
  Filled 2015-12-05 (×3): qty 1

## 2015-12-05 MED ORDER — BUPIVACAINE LIPOSOME 1.3 % IJ SUSP
20.0000 mL | Freq: Once | INTRAMUSCULAR | Status: DC
  Filled 2015-12-05: qty 20

## 2015-12-05 MED ORDER — PHENYLEPHRINE HCL 10 MG/ML IJ SOLN
INTRAMUSCULAR | Status: DC | PRN
  Administered 2015-12-05 (×5): 80 ug via INTRAVENOUS

## 2015-12-05 MED ORDER — KCL IN DEXTROSE-NACL 20-5-0.45 MEQ/L-%-% IV SOLN
INTRAVENOUS | Status: DC
  Administered 2015-12-05: 125 mL/h via INTRAVENOUS
  Administered 2015-12-06: 06:00:00 via INTRAVENOUS
  Filled 2015-12-05 (×3): qty 1000

## 2015-12-05 MED ORDER — HYDROMORPHONE HCL 1 MG/ML IJ SOLN
INTRAMUSCULAR | Status: AC
  Administered 2015-12-05: 0.5 mg via INTRAVENOUS
  Filled 2015-12-05: qty 0.5

## 2015-12-05 SURGICAL SUPPLY — 55 items
BANDAGE ELASTIC 6 VELCRO ST LF (GAUZE/BANDAGES/DRESSINGS) IMPLANT
BANDAGE ESMARK 6X9 LF (GAUZE/BANDAGES/DRESSINGS) ×1 IMPLANT
BLADE SAG 18X100X1.27 (BLADE) ×2 IMPLANT
BLADE SAW SGTL 13X75X1.27 (BLADE) ×2 IMPLANT
BLADE SURG ROTATE 9660 (MISCELLANEOUS) IMPLANT
BNDG CMPR 9X6 STRL LF SNTH (GAUZE/BANDAGES/DRESSINGS) ×1
BNDG CMPR MED 10X6 ELC LF (GAUZE/BANDAGES/DRESSINGS) ×1
BNDG ELASTIC 6X10 VLCR STRL LF (GAUZE/BANDAGES/DRESSINGS) ×2 IMPLANT
BNDG ESMARK 6X9 LF (GAUZE/BANDAGES/DRESSINGS) ×2
BOWL SMART MIX CTS (DISPOSABLE) ×2 IMPLANT
CAPT KNEE TOTAL 3 ATTUNE ×2 IMPLANT
CEMENT HV SMART SET (Cement) ×4 IMPLANT
COVER SURGICAL LIGHT HANDLE (MISCELLANEOUS) ×2 IMPLANT
CUFF TOURNIQUET SINGLE 34IN LL (TOURNIQUET CUFF) ×2 IMPLANT
CUFF TOURNIQUET SINGLE 44IN (TOURNIQUET CUFF) IMPLANT
DRAPE HALF SHEET 40X57 (DRAPES) ×2 IMPLANT
DRAPE U-SHAPE 47X51 STRL (DRAPES) ×2 IMPLANT
DRSG AQUACEL AG ADV 3.5X10 (GAUZE/BANDAGES/DRESSINGS) ×2 IMPLANT
DURAPREP 26ML APPLICATOR (WOUND CARE) ×4 IMPLANT
ELECT REM PT RETURN 9FT ADLT (ELECTROSURGICAL) ×2
ELECTRODE REM PT RTRN 9FT ADLT (ELECTROSURGICAL) ×1 IMPLANT
EVACUATOR 1/8 PVC DRAIN (DRAIN) IMPLANT
GLOVE BIO SURGEON STRL SZ7.5 (GLOVE) ×2 IMPLANT
GLOVE BIO SURGEON STRL SZ8.5 (GLOVE) ×2 IMPLANT
GLOVE BIOGEL PI IND STRL 8 (GLOVE) ×1 IMPLANT
GLOVE BIOGEL PI IND STRL 9 (GLOVE) ×1 IMPLANT
GLOVE BIOGEL PI INDICATOR 8 (GLOVE) ×1
GLOVE BIOGEL PI INDICATOR 9 (GLOVE) ×1
GOWN STRL REUS W/ TWL LRG LVL3 (GOWN DISPOSABLE) ×1 IMPLANT
GOWN STRL REUS W/ TWL XL LVL3 (GOWN DISPOSABLE) ×2 IMPLANT
GOWN STRL REUS W/TWL LRG LVL3 (GOWN DISPOSABLE) ×1
GOWN STRL REUS W/TWL XL LVL3 (GOWN DISPOSABLE) ×2
HANDPIECE INTERPULSE COAX TIP (DISPOSABLE) ×1
HOOD PEEL AWAY FACE SHEILD DIS (HOOD) ×4 IMPLANT
KIT BASIN OR (CUSTOM PROCEDURE TRAY) ×2 IMPLANT
KIT ROOM TURNOVER OR (KITS) ×2 IMPLANT
MANIFOLD NEPTUNE II (INSTRUMENTS) ×2 IMPLANT
NEEDLE 22X1 1/2 (OR ONLY) (NEEDLE) ×4 IMPLANT
NS IRRIG 1000ML POUR BTL (IV SOLUTION) ×2 IMPLANT
PACK TOTAL JOINT (CUSTOM PROCEDURE TRAY) ×2 IMPLANT
PAD ARMBOARD 7.5X6 YLW CONV (MISCELLANEOUS) ×4 IMPLANT
SET HNDPC FAN SPRY TIP SCT (DISPOSABLE) ×1 IMPLANT
SUT VIC AB 0 CT1 27 (SUTURE) ×2
SUT VIC AB 0 CT1 27XBRD ANBCTR (SUTURE) ×1 IMPLANT
SUT VIC AB 1 CTX 36 (SUTURE) ×2
SUT VIC AB 1 CTX36XBRD ANBCTR (SUTURE) ×1 IMPLANT
SUT VIC AB 2-0 CT1 27 (SUTURE)
SUT VIC AB 2-0 CT1 TAPERPNT 27 (SUTURE) IMPLANT
SUT VIC AB 3-0 CT1 27 (SUTURE) ×2
SUT VIC AB 3-0 CT1 TAPERPNT 27 (SUTURE) ×1 IMPLANT
SYR CONTROL 10ML LL (SYRINGE) ×4 IMPLANT
TOWEL OR 17X24 6PK STRL BLUE (TOWEL DISPOSABLE) ×2 IMPLANT
TOWEL OR 17X26 10 PK STRL BLUE (TOWEL DISPOSABLE) ×2 IMPLANT
TRAY CATH 16FR W/PLASTIC CATH (SET/KITS/TRAYS/PACK) IMPLANT
WATER STERILE IRR 1000ML POUR (IV SOLUTION) ×6 IMPLANT

## 2015-12-05 NOTE — Interval H&P Note (Signed)
History and Physical Interval Note:  12/05/2015 9:58 AM  Brandi Rivas  has presented today for surgery, with the diagnosis of RIGHT KNEE DEGENERATIVE JOINT DISEASE  The various methods of treatment have been discussed with the patient and family. After consideration of risks, benefits and other options for treatment, the patient has consented to  Procedure(s): TOTAL KNEE ARTHROPLASTY (Right) as a surgical intervention .  The patient's history has been reviewed, patient examined, no change in status, stable for surgery.  I have reviewed the patient's chart and labs.  Questions were answered to the patient's satisfaction.     Nestor LewandowskyOWAN,Amani Marseille J

## 2015-12-05 NOTE — Anesthesia Preprocedure Evaluation (Signed)
Anesthesia Evaluation  Patient identified by MRN, date of birth, ID band Patient awake    Reviewed: Allergy & Precautions, NPO status , Patient's Chart, lab work & pertinent test results  Airway Mallampati: II   Neck ROM: full    Dental   Pulmonary pneumonia,    breath sounds clear to auscultation       Cardiovascular hypertension,  Rhythm:regular Rate:Normal     Neuro/Psych    GI/Hepatic   Endo/Other  obese  Renal/GU      Musculoskeletal  (+) Arthritis ,   Abdominal   Peds  Hematology   Anesthesia Other Findings   Reproductive/Obstetrics                             Anesthesia Physical  Anesthesia Plan  ASA: II  Anesthesia Plan: Spinal and Regional   Post-op Pain Management:    Induction: Intravenous  Airway Management Planned: Simple Face Mask  Additional Equipment:   Intra-op Plan:   Post-operative Plan:   Informed Consent: I have reviewed the patients History and Physical, chart, labs and discussed the procedure including the risks, benefits and alternatives for the proposed anesthesia with the patient or authorized representative who has indicated his/her understanding and acceptance.   Dental advisory given  Plan Discussed with: CRNA  Anesthesia Plan Comments:         Anesthesia Quick Evaluation

## 2015-12-05 NOTE — Anesthesia Procedure Notes (Signed)
Procedure Name: MAC Date/Time: 12/05/2015 11:27 AM Performed by: Roney MansSMITH, Delwyn Scoggin P Pre-anesthesia Checklist: Patient identified, Emergency Drugs available, Suction available, Patient being monitored and Timeout performed Patient Re-evaluated:Patient Re-evaluated prior to inductionOxygen Delivery Method: Simple face mask Intubation Type: IV induction Placement Confirmation: positive ETCO2 Dental Injury: Teeth and Oropharynx as per pre-operative assessment

## 2015-12-05 NOTE — Op Note (Signed)
PATIENT ID:      Brandi Rivas  MRN:     [id number] DOB/AGE:    Mar 15, 1960 / 55 y.o.       OPERATIVE REPORT    DATE OF PROCEDURE:  12/05/2015       PREOPERATIVE DIAGNOSIS:   RIGHT KNEE DEGENERATIVE JOINT DISEASE      Estimated body mass index is 36.64 kg/m as calculated from the following:   Height as of this encounter: 5\' 6"  (1.676 m).   Weight as of this encounter: 103 kg (227 lb).                                                        POSTOPERATIVE DIAGNOSIS:   RIGHT KNEE DEGENERATIVE JOINT DISEASE                                                                      PROCEDURE:  Procedure(s): TOTAL KNEE ARTHROPLASTY Using DepuyAttune RP implants #7R Femur, #7Tibia, 5 mm Attune RP bearing, 41 Patella     SURGEON: Shakinah Navis J    ASSISTANT:   Eric K. [employer] PA-C   (Present and scrubbed throughout the case, critical for assistance with exposure, retraction, instrumentation, and closure.)         ANESTHESIA: spinal, 20cc Exparel, 50cc 0.25% Marcaine  EBL: 300  FLUID REPLACEMENT: 1600 crystalloid  TOURNIQUET TIME: 15min  Drains: None  Tranexamic Acid: 2gm topical   COMPLICATIONS:  None         INDICATIONS FOR PROCEDURE: The patient has  RIGHT KNEE DEGENERATIVE JOINT DISEASE, Var deformities, XR shows bone on bone arthritis, lateral subluxation of tibia. Patient has failed all conservative measures including anti-inflammatory medicines, narcotics, attempts at  exercise and weight loss, cortisone injections and viscosupplementation.  Risks and benefits of surgery have been discussed, questions answered.   DESCRIPTION OF PROCEDURE: The patient identified by armband, received  IV antibiotics, in the holding area at [facility]. Patient taken to the operating room, appropriate anesthetic  monitors were attached, and Spinal anesthesia was  induced. Tourniquet  applied high to the operative thigh. Lateral post and foot positioner  applied to the table, the lower extremity was  then prepped and draped  in usual sterile fashion from the toes to the tourniquet. Time-out procedure was performed. We began the operation, with the knee flexed 120 degrees, by making the anterior midline incision starting at handbreadth above the patella going over the patella 1 cm medial to and 4 cm distal to the tibial tubercle. Small bleeders in the skin and the  subcutaneous tissue identified and cauterized. Transverse retinaculum was incised and reflected medially and a medial parapatellar arthrotomy was accomplished. the patella was everted and theprepatellar fat pad resected. The superficial medial collateral  ligament was then elevated from anterior to posterior along the proximal  flare of the tibia and anterior half of the menisci resected. The knee was hyperflexed exposing bone on bone arthritis. Peripheral and notch osteophytes as well as the cruciate ligaments were then resected. We continued to  work our way around  posteriorly along the proximal tibia, and externally  rotated the tibia subluxing it out from underneath the femur. A McHale  retractor was placed through the notch and a lateral Hohmann retractor  placed, and we then drilled through the proximal tibia in line with the  axis of the tibia followed by an intramedullary guide rod and 2-degree  posterior slope cutting guide. The tibial cutting guide, 3 degree posterior sloped, was pinned into place allowing resection of 6 mm of bone medially and 12 mm of bone laterally. Satisfied with the tibial resection, we then  entered the distal femur 2 mm anterior to the PCL origin with the  intramedullary guide rod and applied the distal femoral cutting guide  set at 9 mm, with 5 degrees of valgus. This was pinned along the  epicondylar axis. At this point, the distal femoral cut was accomplished without difficulty. We then sized for a #7R femoral component and pinned the guide in 3 degrees of external rotation. The chamfer cutting guide  was pinned into place. The anterior, posterior, and chamfer cuts were accomplished without difficulty followed by  the Attune RP box cutting guide and the box cut. We also removed posterior osteophytes from the posterior femoral condyles. At this  time, the knee was brought into full extension. We checked our  extension and flexion gaps and found them symmetric for a 5 mm bearing. Distracting in extension with a lamina spreader, the posterior horns of the menisci were removed, and Exparel, diluted to 60 cc, with 20cc NS, and 20cc 0.5% Marcaine,was injected into the capsule and synovium of the knee. The posterior patella cut was accomplished with the 9.5 mm Attune cutting guide, sized for a 41mm dome, and the fixation pegs drilled.The knee  was then once again hyperflexed exposing the proximal tibia. We sized for a # 7 tibial base plate, applied the smokestack and the conical reamer followed by the the Delta fin keel punch. We then hammered into place the Attune RP trial femoral component, drilled the lugs, inserted a  5 mm trial bearing, trial patellar button, and took the knee through range of motion from 0-130 degrees. No thumb pressure was required for patellar Tracking. At this point, the limb was wrapped with an Esmarch bandage and the tourniquet inflated to 350 mmHg. All trial components were removed, mating surfaces irrigated with pulse lavage, and dried with suction and sponges. A double batch of DePuy HV cement with 1500 mg of Zinacef was mixed and applied to all bony metallic mating surfaces except for the posterior condyles of the femur itself. In order, we  hammered into place the tibial tray and removed excess cement, the femoral component and removed excess cement. The final Attune RP bearing  was inserted, and the knee brought to full extension with compression.  The patellar button was clamped into place, and excess cement  removed. While the cement cured the wound was irrigated out with  normal saline solution pulse lavage. Ligament stability and patellar tracking were checked and found to be excellent. The parapatellar arthrotomy was closed with  running #1 Vicryl suture. The subcutaneous tissue with 0 and 2-0 undyed  Vicryl suture, and the skin with running 3-0 SQ vicryl. A dressing of Xeroform,  4 x 4, dressing sponges, Webril, and Ace wrap applied. The patient  awakened, and taken to recovery room without difficulty.   Panhia Karl J 12/05/2015, 12:51 PM

## 2015-12-05 NOTE — Anesthesia Procedure Notes (Signed)
Spinal  Patient location during procedure: OR Staffing Anesthesiologist: Angeni Chaudhuri Performed: anesthesiologist  Preanesthetic Checklist Completed: patient identified, site marked, surgical consent, pre-op evaluation, timeout performed, IV checked, risks and benefits discussed and monitors and equipment checked Spinal Block Patient position: sitting Prep: ChloraPrep Patient monitoring: heart rate, continuous pulse ox and blood pressure Approach: right paramedian Location: L3-4 Injection technique: single-shot Needle Needle type: Sprotte  Needle gauge: 24 G Needle length: 9 cm Additional Notes Expiration date of kit checked and confirmed. Patient tolerated procedure well, without complications.       

## 2015-12-05 NOTE — Progress Notes (Signed)
Orthopedic Tech Progress Note Patient Details:  Brandi Rivas 1960-11-02 [id number]  CPM Right Knee CPM Right Knee: On Right Knee Flexion (Degrees): 40 Right Knee Extension (Degrees): 10 Additional Comments: Trapeze bar and foot roll   Saul FordyceJennifer C Edda Orea 12/05/2015, 2:07 PM

## 2015-12-05 NOTE — Discharge Instructions (Addendum)
INSTRUCTIONS AFTER JOINT REPLACEMENT  ° °o Remove items at home which could result in a fall. This includes throw rugs or furniture in walking pathways °o ICE to the affected joint every three hours while awake for 30 minutes at a time, for at least the first 3-5 days, and then as needed for pain and swelling.  Continue to use ice for pain and swelling. You may notice swelling that will progress down to the foot and ankle.  This is normal after surgery.  Elevate your leg when you are not up walking on it.   °o Continue to use the breathing machine you got in the hospital (incentive spirometer) which will help keep your temperature down.  It is common for your temperature to cycle up and down following surgery, especially at night when you are not up moving around and exerting yourself.  The breathing machine keeps your lungs expanded and your temperature down. ° ° °DIET:  As you were doing prior to hospitalization, we recommend a well-balanced diet. ° °DRESSING / WOUND CARE / SHOWERING ° °Keep the surgical dressing until follow up.  The dressing is water proof, so you can shower without any extra covering.  IF THE DRESSING FALLS OFF or the wound gets wet inside, change the dressing with sterile gauze.  Please use good hand washing techniques before changing the dressing.  Do not use any lotions or creams on the incision until instructed by your surgeon.   ° °ACTIVITY ° °o Increase activity slowly as tolerated, but follow the weight bearing instructions below.   °o No driving for 6 weeks or until further direction given by your physician.  You cannot drive while taking narcotics.  °o No lifting or carrying greater than 10 lbs. until further directed by your surgeon. °o Avoid periods of inactivity such as sitting longer than an hour when not asleep. This helps prevent blood clots.  °o You may return to work once you are authorized by your doctor.  ° ° ° °WEIGHT BEARING  ° °Weight bearing as tolerated with assist  device (walker, cane, etc) as directed, use it as long as suggested by your surgeon or therapist, typically at least 4-6 weeks. ° ° °EXERCISES ° °Results after joint replacement surgery are often greatly improved when you follow the exercise, range of motion and muscle strengthening exercises prescribed by your doctor. Safety measures are also important to protect the joint from further injury. Any time any of these exercises cause you to have increased pain or swelling, decrease what you are doing until you are comfortable again and then slowly increase them. If you have problems or questions, call your caregiver or physical therapist for advice.  ° °Rehabilitation is important following a joint replacement. After just a few days of immobilization, the muscles of the leg can become weakened and shrink (atrophy).  These exercises are designed to build up the tone and strength of the thigh and leg muscles and to improve motion. Often times heat used for twenty to thirty minutes before working out will loosen up your tissues and help with improving the range of motion but do not use heat for the first two weeks following surgery (sometimes heat can increase post-operative swelling).  ° °These exercises can be done on a training (exercise) mat, on the floor, on a table or on a bed. Use whatever works the best and is most comfortable for you.    Use music or television while you are exercising so that   the exercises are a pleasant break in your day. This will make your life better with the exercises acting as a break in your routine that you can look forward to.   Perform all exercises about fifteen times, three times per day or as directed.  You should exercise both the operative leg and the other leg as well. ° °Exercises include: °  °• Quad Sets - Tighten up the muscle on the front of the thigh (Quad) and hold for 5-10 seconds.   °• Straight Leg Raises - With your knee straight (if you were given a brace, keep it on),  lift the leg to 60 degrees, hold for 3 seconds, and slowly lower the leg.  Perform this exercise against resistance later as your leg gets stronger.  °• Leg Slides: Lying on your back, slowly slide your foot toward your buttocks, bending your knee up off the floor (only go as far as is comfortable). Then slowly slide your foot back down until your leg is flat on the floor again.  °• Angel Wings: Lying on your back spread your legs to the side as far apart as you can without causing discomfort.  °• Hamstring Strength:  Lying on your back, push your heel against the floor with your leg straight by tightening up the muscles of your buttocks.  Repeat, but this time bend your knee to a comfortable angle, and push your heel against the floor.  You may put a pillow under the heel to make it more comfortable if necessary.  ° °A rehabilitation program following joint replacement surgery can speed recovery and prevent re-injury in the future due to weakened muscles. Contact your doctor or a physical therapist for more information on knee rehabilitation.  ° ° °CONSTIPATION ° °Constipation is defined medically as fewer than three stools per week and severe constipation as less than one stool per week.  Even if you have a regular bowel pattern at home, your normal regimen is likely to be disrupted due to multiple reasons following surgery.  Combination of anesthesia, postoperative narcotics, change in appetite and fluid intake all can affect your bowels.  ° °YOU MUST use at least one of the following options; they are listed in order of increasing strength to get the job done.  They are all available over the counter, and you may need to use some, POSSIBLY even all of these options:   ° °Drink plenty of fluids (prune juice may be helpful) and high fiber foods °Colace 100 mg by mouth twice a day  °Senokot for constipation as directed and as needed Dulcolax (bisacodyl), take with full glass of water  °Miralax (polyethylene glycol)  once or twice a day as needed. ° °If you have tried all these things and are unable to have a bowel movement in the first 3-4 days after surgery call either your surgeon or your primary doctor.   ° °If you experience loose stools or diarrhea, hold the medications until you stool forms back up.  If your symptoms do not get better within 1 week or if they get worse, check with your doctor.  If you experience "the worst abdominal pain ever" or develop nausea or vomiting, please contact the office immediately for further recommendations for treatment. ° ° °ITCHING:  If you experience itching with your medications, try taking only a single pain pill, or even half a pain pill at a time.  You can also use Benadryl over the counter for itching or also to   help with sleep.  ° °TED HOSE STOCKINGS:  Use stockings on both legs until for at least 2 weeks or as directed by physician office. They may be removed at night for sleeping. ° °MEDICATIONS:  See your medication summary on the “After Visit Summary” that nursing will review with you.  You may have some home medications which will be placed on hold until you complete the course of blood thinner medication.  It is important for you to complete the blood thinner medication as prescribed. ° °PRECAUTIONS:  If you experience chest pain or shortness of breath - call 911 immediately for transfer to the hospital emergency department.  ° °If you develop a fever greater that 101 F, purulent drainage from wound, increased redness or drainage from wound, foul odor from the wound/dressing, or calf pain - CONTACT YOUR SURGEON.   °                                                °FOLLOW-UP APPOINTMENTS:  If you do not already have a post-op appointment, please call the office for an appointment to be seen by your surgeon.  Guidelines for how soon to be seen are listed in your “After Visit Summary”, but are typically between 1-4 weeks after surgery. ° °OTHER INSTRUCTIONS:  ° °Knee  Replacement:  Do not place pillow under knee, focus on keeping the knee straight while resting. CPM instructions: 0-90 degrees, 2 hours in the morning, 2 hours in the afternoon, and 2 hours in the evening. Place foam block, curve side up under heel at all times except when in CPM or when walking.  DO NOT modify, tear, cut, or change the foam block in any way. ° °MAKE SURE YOU:  °• Understand these instructions.  °• Get help right away if you are not doing well or get worse.  ° ° °Thank you for letting us be a part of your medical care team.  It is a privilege we respect greatly.  We hope these instructions will help you stay on track for a fast and full recovery!  ° ° ° ° °Information on my medicine - ELIQUIS® (apixaban) ° °This medication education was reviewed with me or my healthcare representative as part of my discharge preparation.  The pharmacist that spoke with me during my hospital stay was:  Korin Setzler Dien, RPH ° °Why was Eliquis® prescribed for you? °Eliquis® was prescribed for you to reduce the risk of blood clots forming after orthopedic surgery.   ° °What do You need to know about Eliquis®? °Take your Eliquis® TWICE DAILY - one tablet in the morning and one tablet in the evening with or without food.  It would be best to take the dose about the same time each day. ° °If you have difficulty swallowing the tablet whole please discuss with your pharmacist how to take the medication safely. ° °Take Eliquis® exactly as prescribed by your doctor and DO NOT stop taking Eliquis® without talking to the doctor who prescribed the medication.  Stopping without other medication to take the place of Eliquis® may increase your risk of developing a clot. ° °After discharge, you should have regular check-up appointments with your healthcare provider that is prescribing your Eliquis®. ° °What do you do if you miss a dose? °If a dose of ELIQUIS® is not taken at the   scheduled time, take it as soon as possible on the same  day and twice-daily administration should be resumed.  The dose should not be doubled to make up for a missed dose.  Do not take more than one tablet of ELIQUIS at the same time. ° °Important Safety Information °A possible side effect of Eliquis® is bleeding. You should call your healthcare provider right away if you experience any of the following: °? Bleeding from an injury or your nose that does not stop. °? Unusual colored urine (red or dark brown) or unusual colored stools (red or black). °? Unusual bruising for unknown reasons. °? A serious fall or if you hit your head (even if there is no bleeding). ° °Some medicines may interact with Eliquis® and might increase your risk of bleeding or clotting while on Eliquis®. To help avoid this, consult your healthcare provider or pharmacist prior to using any new prescription or non-prescription medications, including herbals, vitamins, non-steroidal anti-inflammatory drugs (NSAIDs) and supplements. ° °This website has more information on Eliquis® (apixaban): http://www.eliquis.com/eliquis/home ° °

## 2015-12-05 NOTE — Anesthesia Procedure Notes (Signed)
Anesthesia Regional Block:  Adductor canal block  Pre-Anesthetic Checklist: ,, timeout performed, Correct Patient, Correct Site, Correct Laterality, Correct Procedure, Correct Position, site marked, Risks and benefits discussed, Surgical consent,  Pre-op evaluation,  Post-op pain management  Laterality: Right  Prep: chloraprep       Needles:  Injection technique: Single-shot  Needle Type: Stimiplex     Needle Length: 9cm 9 cm Needle Gauge: 21 and 21 G    Additional Needles:  Procedures: ultrasound guided (picture in chart) Adductor canal block Narrative:  Injection made incrementally with aspirations every 5 mL.  Performed by: Personally  Anesthesiologist: Euretha Najarro  Additional Notes: BP cuff, EKG monitors applied. Sedation begun. Artery and nerve location verified with U/S and anesthetic injected incrementally, slowly, and after negative aspirations under direct u/s guidance. Good fascial /perineural spread. Tolerated well.      

## 2015-12-05 NOTE — Transfer of Care (Signed)
Immediate Anesthesia Transfer of Care Note  Patient: Brandi Rivas  Procedure(s) Performed: Procedure(s): TOTAL KNEE ARTHROPLASTY (Right)  Patient Location: PACU  Anesthesia Type:MAC  Level of Consciousness: awake, alert , oriented and patient cooperative  Airway & Oxygen Therapy: Patient Spontanous Breathing  Post-op Assessment: Report given to RN and Post -op Vital signs reviewed and stable  Post vital signs: Reviewed and stable  Last Vitals:  Vitals:   12/05/15 1059 12/05/15 1100  BP:  126/68  Pulse: 74 77  Resp: 19 19  Temp:      Last Pain:  Vitals:   12/05/15 1100  TempSrc:   PainSc: 6          Complications: No apparent anesthesia complications

## 2015-12-06 ENCOUNTER — Encounter (HOSPITAL_COMMUNITY): Payer: Self-pay | Admitting: Orthopedic Surgery

## 2015-12-06 LAB — BASIC METABOLIC PANEL
Anion gap: 5 (ref 5–15)
BUN: 11 mg/dL (ref 6–20)
CO2: 24 mmol/L (ref 22–32)
Calcium: 8.1 mg/dL — ABNORMAL LOW (ref 8.9–10.3)
Chloride: 102 mmol/L (ref 101–111)
Creatinine, Ser: 0.7 mg/dL (ref 0.44–1.00)
GFR calc Af Amer: >60 mL/min (ref >60)
GFR calc non Af Amer: >60 mL/min (ref >60)
Glucose, Bld: 130 mg/dL — ABNORMAL HIGH (ref 65–99)
Potassium: 4.4 mmol/L (ref 3.5–5.1)
Sodium: 131 mmol/L — ABNORMAL LOW (ref 135–145)

## 2015-12-06 LAB — CBC
HCT: 34.5 % — ABNORMAL LOW (ref 36.0–46.0)
Hemoglobin: 11 g/dL — ABNORMAL LOW (ref 12.0–15.0)
MCH: 29 pg (ref 26.0–34.0)
MCHC: 31.9 g/dL (ref 30.0–36.0)
MCV: 91 fL (ref 78.0–100.0)
Platelets: 165 10*3/uL (ref 150–400)
RBC: 3.79 MIL/uL — ABNORMAL LOW (ref 3.87–5.11)
RDW: 12.4 % (ref 11.5–15.5)
WBC: 8.6 10*3/uL (ref 4.0–10.5)

## 2015-12-06 NOTE — Progress Notes (Signed)
Orthopedic Tech Progress Note Patient Details:  Akshara D Metzner Adrine 28, 1962 [id number]  Patient ID: Adaira D Pozo, female   DOB: Briggitte 28, 1962, 55 y.o.   MRN: [id number] Applied CPM at 10-40.  Alvina ChouWilliams, Latera Mclin C 12/06/2015, 6:50 AM

## 2015-12-06 NOTE — Anesthesia Postprocedure Evaluation (Signed)
Anesthesia Post Note  Patient: Brandi Rivas  Procedure(s) Performed: Procedure(s) (LRB): TOTAL KNEE ARTHROPLASTY (Right)  Patient location during evaluation: PACU Anesthesia Type: Spinal and MAC Level of consciousness: awake and alert Pain management: pain level controlled Vital Signs Assessment: post-procedure vital signs reviewed and stable Respiratory status: spontaneous breathing and respiratory function stable Cardiovascular status: blood pressure returned to baseline and stable Postop Assessment: spinal receding Anesthetic complications: no    Last Vitals:  Vitals:   12/06/15 0710 12/06/15 1419  BP: 121/83 133/78  Pulse: 89 (!) 115  Resp: 16 16  Temp: 37 C 37.1 C    Last Pain:  Vitals:   12/06/15 1236  TempSrc:   PainSc: 2                  Lewie LoronJohn Walker Paddack

## 2015-12-06 NOTE — [place] FL2 (Signed)
[place] MED[place]2 LEVEL OF CARE SCREENING TOOL     IDENTIFICATION  Patient Name: Brandi Rivas Birthdate: 11/20/1960 Sex: female Admission Date (Current Location): 12/05/2015  [facility] [place]   Facility and Address:  The [facility], [address], [place], [place] [address]      Provider Number: [id number]  Attending Physician Name and Address:  Gean BirchwoodFrank Rowan, MD  Relative Name and Phone Number:       Current Level of Care: Hospital Recommended Level of Care: Skilled Nursing Facility Prior Approval Number:    Date Approved/Denied:   PASRR Number:    Discharge Plan: SNF    Current Diagnoses: Patient Active Problem List   Diagnosis Date Noted  . Primary osteoarthritis of right knee 12/02/2015  . Primary osteoarthritis of left knee 05/14/2015  . Pulmonary emboli (HCC) 04/18/2013  . Pulmonary embolus (HCC) 04/17/2013  . Constipation, chronic 12/29/2012  . Hypertension   . Ventral hernia - periumbilical - s/p lap repair 12/09/2012 11/03/2012  . Obesity (BMI 30-39.9) 11/03/2012    Orientation RESPIRATION BLADDER Height & Weight     Self, Time, Situation, Place  Normal Continent Weight: 227 lb (103 kg) Height:  5\' 6"  (167.6 cm)  BEHAVIORAL SYMPTOMS/MOOD NEUROLOGICAL BOWEL NUTRITION STATUS      Continent    AMBULATORY STATUS COMMUNICATION OF NEEDS Skin   Limited Assist Verbally Normal                       Personal Care Assistance Level of Assistance  Bathing, Dressing Bathing Assistance: Limited assistance   Dressing Assistance: Limited assistance     Functional Limitations Info             SPECIAL CARE FACTORS FREQUENCY                       Contractures      Additional Factors Info   (Full)               Current Medications (12/06/2015):  This is the current hospital active medication list Current Facility-Administered Medications  Medication Dose Route Frequency Provider  Last Rate Last Dose  . acetaminophen (TYLENOL) tablet 650 mg  650 mg Oral Q6H PRN Gean BirchwoodFrank Rowan, MD       Or  . acetaminophen (TYLENOL) suppository 650 mg  650 mg Rectal Q6H PRN Gean BirchwoodFrank Rowan, MD      . albuterol (PROVENTIL) (2.5 MG/3ML) 0.083% nebulizer solution 3 mL  3 mL Inhalation Q6H PRN Gean BirchwoodFrank Rowan, MD      . alum & mag hydroxide-simeth (MAALOX/MYLANTA) 200-200-20 MG/5ML suspension 30 mL  30 mL Oral Q4H PRN Gean BirchwoodFrank Rowan, MD      . apixaban Everlene Balls(ELIQUIS) tablet 2.5 mg  2.5 mg Oral Q12H Gean BirchwoodFrank Rowan, MD   2.5 mg at 12/06/15 0842  . bisacodyl (DULCOLAX) EC tablet 5 mg  5 mg Oral Daily PRN Gean BirchwoodFrank Rowan, MD      . budesonide (PULMICORT) nebulizer solution 0.25 mg  0.25 mg Nebulization BID Gean BirchwoodFrank Rowan, MD   0.25 mg at 12/06/15 0859  . dextrose 5 % and 0.45 % NaCl with KCl 20 mEq/L infusion   Intravenous Continuous Gean BirchwoodFrank Rowan, MD 125 mL/hr at 12/06/15 1000    . diphenhydrAMINE (BENADRYL) 12.5 MG/5ML elixir 12.5-25 mg  12.5-25 mg Oral Q4H PRN Gean BirchwoodFrank Rowan, MD      . docusate sodium (COLACE) capsule 100 mg  100 mg  Oral BID Gean BirchwoodFrank Rowan, MD   100 mg at 12/06/15 0842  . fluticasone (FLONASE) 50 MCG/ACT nasal spray 2 spray  2 spray Each Nare Daily PRN Gean BirchwoodFrank Rowan, MD      . HYDROcodone-acetaminophen [facility](NORCO) 10-325 MG per tablet 1-2 tablet  1-2 tablet Oral Q4H PRN Gean BirchwoodFrank Rowan, MD   2 tablet at 12/06/15 1136  . HYDROmorphone (DILAUDID) injection 0.5-1 mg  0.5-1 mg Intravenous Q2H PRN Gean BirchwoodFrank Rowan, MD   1 mg at 12/06/15 0515  . menthol-cetylpyridinium (CEPACOL) lozenge 3 mg  1 lozenge Oral PRN Gean BirchwoodFrank Rowan, MD       Or  . phenol (CHLORASEPTIC) mouth spray 1 spray  1 spray Mouth/Throat PRN Gean BirchwoodFrank Rowan, MD      . methocarbamol (ROBAXIN) tablet 500 mg  500 mg Oral Q6H PRN Gean BirchwoodFrank Rowan, MD       Or  . methocarbamol (ROBAXIN) 500 mg in dextrose 5 % 50 mL IVPB  500 mg Intravenous Q6H PRN Gean BirchwoodFrank Rowan, MD      . metoCLOPramide (REGLAN) tablet 5-10 mg  5-10 mg Oral Q8H PRN Gean BirchwoodFrank Rowan, MD       Or  . metoCLOPramide (REGLAN)  injection 5-10 mg  5-10 mg Intravenous Q8H PRN Gean BirchwoodFrank Rowan, MD      . ondansetron [facility](ZOFRAN) tablet 4 mg  4 mg Oral Q6H PRN Gean BirchwoodFrank Rowan, MD   4 mg at 12/06/15 0815   Or  . ondansetron (ZOFRAN) injection 4 mg  4 mg Intravenous Q6H PRN Gean BirchwoodFrank Rowan, MD   4 mg at 12/05/15 2128  . senna-docusate (Senokot-S) tablet 1 tablet  1 tablet Oral QHS PRN Gean BirchwoodFrank Rowan, MD      . sodium phosphate (FLEET) 7-19 GM/118ML enema 1 enema  1 enema Rectal Once PRN Gean BirchwoodFrank Rowan, MD      . traMADol Janean Sark(ULTRAM) tablet 50 mg  50 mg Oral Q12H PRN Gean BirchwoodFrank Rowan, MD   50 mg at 12/06/15 [id number]     Discharge Medications: Please see discharge summary for a list of discharge medications.  Relevant Imaging Results:  Relevant Lab Results:   Additional Information    Harlon FlorWhitaker, [place]view[place]ittney R

## 2015-12-06 NOTE — Progress Notes (Signed)
Physical Therapy Treatment Patient Details Name: Brandi Rivas MRN: [id number] DOB: [birth year] Today's Date: 12/06/2015    History of Present Illness Admitted for RTKA;  has a past medical history of Acute urinary retention (12/10/2012); Arthritis; History of anemia; History of blood clots; Hypertension; Pneumonia (01/09/2011); Sinus congestion; Urinary frequency; Ventral hernia; and Wears glasses.  has a past surgical history that includes Arthroscopy knee w/ drilling (Bilateral); Ventral hernia repair (N/A, 12/09/2012); Insertion of mesh (N/A, 12/09/2012); Colonoscopy; Esophagogastroduodenoscopy; and Total knee arthroplasty (Left, 05/16/2015).    PT Comments    Pain better controlled, and pt able to walk further; Very motivated to improve; Needs to work on L knee flexion; noted deficits in R knee flexion as well  Follow Up Recommendations  SNF     Equipment Recommendations  None recommended by PT    Recommendations for Other Services       Precautions / Restrictions Precautions Precautions: Knee Restrictions Weight Bearing Restrictions: Yes RLE Weight Bearing: Weight bearing as tolerated  Pt educated to not allow any pillow or bolster under knee for healing with optimal range of motion.    Mobility  Bed Mobility Overal bed mobility: Needs Assistance Bed Mobility: Sit to Supine       Sit to supine: Min assist   General bed mobility comments: Min assist to help LEs into bed  Transfers Overall transfer level: Needs assistance Equipment used: Rolling walker (2 wheeled) Transfers: Sit to/from Stand Sit to Stand: Mod assist         General transfer comment: Light mod assist to power up and steady; stood from recliner and 3in1 commode  Ambulation/Gait Ambulation/Gait assistance: Min guard;Supervision Ambulation Distance (Feet): 85 Feet Assistive device: Rolling walker (2 wheeled) Gait Pattern/deviations: Decreased step length - left;Decreased stance time -  right Gait velocity: slow   General Gait Details: Cues for sequence, optimal step length, and to use RW to unweigh painful R knee in stance; nice, stable RLE in stance   Stairs            Wheelchair Mobility    Modified Rankin (Stroke Patients Only)       Balance                                    Cognition Arousal/Alertness: Awake/alert Behavior During Therapy: WFL for tasks assessed/performed Overall Cognitive Status: Within Functional Limits for tasks assessed                      Exercises      General Comments        Pertinent Vitals/Pain Pain Assessment: 0-10 Pain Score: 9  Pain Location: R knee Pain Descriptors / Indicators: Aching Pain Intervention(s): Monitored during session;Repositioned;Limited activity within patient's tolerance    Home Living Family/patient expects to be discharged to:: Skilled nursing facility Living Arrangements: Children             Additional Comments: Children work during the day; she must be completely modified independent to d/c home    Prior Function Level of Independence: Independent          PT Goals (current goals can now be found in the care plan section) Acute Rehab PT Goals Patient Stated Goal: Walk without pain; be able to usher at church PT Goal Formulation: With patient Time For Goal Achievement: 12/13/15 Potential to Achieve Goals: Good Progress towards PT goals: Progressing toward goals  Frequency    7X/week      PT Plan Current plan remains appropriate    Co-evaluation             End of Session Equipment Utilized During Treatment: Gait belt Activity Tolerance: Patient tolerated treatment well Patient left: in chair;with call bell/phone within reach     Time: 1438-1510 PT Time Calculation (min) (ACUTE ONLY): 32 min  Charges:  $Gait Training: 23-37 mins                    G Codes:      Olen PelGarrigan, Rekia Kujala Hamff 12/06/2015, 4:36 PM  Van ClinesHolly Jacksen Isip,  [place]  Acute Rehabilitation Services Pager [phone] Office [phone]

## 2015-12-06 NOTE — Progress Notes (Signed)
[name]  MRN: [id number]  DOB/AGE:  September 29, 1960 / 55 y.o.  1 Day Post-Op Procedure(s) (LRB): TOTAL KNEE ARTHROPLASTY (Right)    PROGRESS NOTE Subjective: Patient is alert, oriented, no Nausea, no Vomiting, yes passing gas. Taking PO well. Denies SOB, Chest or Calf Pain. Using Incentive Spirometer, PAS in place. Ambulate WBAT, CPM 0-40 Patient reports pain as 3/10 .    Objective: Vital signs in last 24 hours: Vitals:   12/05/15 1637 12/05/15 2019 12/05/15 2138 12/06/15 0004  BP: 124/84 132/77  128/89  Pulse: 77 88 81 90  Resp: 16 16 16 16   Temp: 98.2 F (36.8 C) 98.3 F (36.8 C)  99.4 F (37.4 C)  TempSrc:  Axillary  Oral  SpO2: 98% 95% 96% 96%  Weight:      Height:          Intake/Output from previous day: I/O last 3 completed shifts: In: 2665.8 [P.O.:320; I.V.:2345.8] Out: 750 [Urine:400; Blood:350]   Intake/Output this shift: No intake/output data recorded.   LABORATORY DATA:  Recent Labs  12/06/15 0552  WBC 8.6  HGB 11.0*  HCT 34.5*  PLT 165  NA 131*  K 4.4  CL 102  CO2 24  BUN 11  CREATININE 0.70  GLUCOSE 130*  CALCIUM 8.1*    Examination: Neurologically intact ABD soft Neurovascular intact Sensation intact distally Intact pulses distally Dorsiflexion/Plantar flexion intact Incision: no drainage No cellulitis present Compartment soft}  Assessment:   1 Day Post-Op Procedure(s) (LRB): TOTAL KNEE ARTHROPLASTY (Right) ADDITIONAL DIAGNOSIS: Expected Acute Blood Loss Anemia, hx DVT, obesity  Plan: PT/OT WBAT, CPM 5/hrs day until ROM 0-90 degrees, then D/C CPM DVT Prophylaxis:  SCDx72hrs, Eliquis 2.5mg  BID x 2 weeks DISCHARGE PLAN: Skilled Nursing Facility/Rehab, [address] DISCHARGE NEEDS: HHPT, CPM, Walker and 3-in-1 comode seat     Other Atienza J 12/06/2015, 7:20 AM [name], female   DOB: September 29, 1960, 55 y.o.   MRN: [id number]

## 2015-12-06 NOTE — Evaluation (Signed)
Physical Therapy Evaluation Patient Details Name: Brandi Rivas MRN: [id number] DOB: 09/05/1960 Today's Date: 12/06/2015   History of Present Illness  Admitted for RTKA;  has a past medical history of Acute urinary retention (12/10/2012); Arthritis; History of anemia; History of blood clots; Hypertension; Pneumonia (01/09/2011); Sinus congestion; Urinary frequency; Ventral hernia; and Wears glasses.  has a past surgical history that includes Arthroscopy knee w/ drilling (Bilateral); Ventral hernia repair (N/A, 12/09/2012); Insertion of mesh (N/A, 12/09/2012); Colonoscopy; Esophagogastroduodenoscopy; and Total knee arthroplasty (Left, 05/16/2015).  Clinical Impression   Pt is s/p TKA resulting in the deficits listed below (see PT Problem List). Plan is for SNF [facility] for further rehab; PT in agreement;  Pt will benefit from skilled PT to increase their independence and safety with mobility to allow discharge to the venue listed below.      Follow Up Recommendations SNF    Equipment Recommendations  None recommended by PT    Recommendations for Other Services       Precautions / Restrictions Precautions Precautions: Knee Restrictions Weight Bearing Restrictions: Yes RLE Weight Bearing: Weight bearing as tolerated      Mobility  Bed Mobility                  Transfers Overall transfer level: Needs assistance Equipment used: Rolling walker (2 wheeled) Transfers: Sit to/from Stand Sit to Stand: Mod assist         General transfer comment: Light mod assist to power up and steady; stood from recliner and 3in1 commode  Ambulation/Gait Ambulation/Gait assistance: Min guard Ambulation Distance (Feet): 20 Feet (to and from bathroom) Assistive device: Rolling walker (2 wheeled) Gait Pattern/deviations: Step-to pattern;Decreased stance time - left;Decreased step length - right Gait velocity: slow   General Gait Details: Cuse for sequence, optimal step length, and  to use RW to Morgan Stanleyunweigh painful R knee in stance  Stairs            Wheelchair Mobility    Modified Rankin (Stroke Patients Only)       Balance                                             Pertinent Vitals/Pain Pain Assessment: 0-10 Pain Score: 9  Pain Location: R knee Pain Descriptors / Indicators: Aching Pain Intervention(s): Limited activity within patient's tolerance;Monitored during session;RN gave pain meds during session;Repositioned    Home Living Family/patient expects to be discharged to:: Skilled nursing facility Living Arrangements: Children               Additional Comments: Children work during the day; she must be completely modified independent to d/c home    Prior Function Level of Independence: Independent               Hand Dominance        Extremity/Trunk Assessment   Upper Extremity Assessment: Overall WFL for tasks assessed           Lower Extremity Assessment: RLE deficits/detail RLE Deficits / Details: Decr AROM and strength, limited by pain       Communication   Communication: No difficulties  Cognition Arousal/Alertness: Awake/alert Behavior During Therapy: WFL for tasks assessed/performed Overall Cognitive Status: Within Functional Limits for tasks assessed                      General  Comments      Exercises     Assessment/Plan    PT Assessment Patient needs continued PT services  PT Problem List Decreased strength;Decreased range of motion;Decreased activity tolerance;Decreased balance;Decreased mobility;Decreased knowledge of use of DME;Decreased knowledge of precautions;Pain          PT Treatment Interventions DME instruction;Gait training;Functional mobility training;Therapeutic activities;Therapeutic exercise;Balance training;Patient/family education    PT Goals (Current goals can be found in the Care Plan section)  Acute Rehab PT Goals Patient Stated Goal: Walk without  pain; be able to usher at church PT Goal Formulation: With patient Time For Goal Achievement: 12/13/15 Potential to Achieve Goals: Good    Frequency 7X/week   Barriers to discharge        Co-evaluation               End of Session Equipment Utilized During Treatment: Gait belt Activity Tolerance: Patient tolerated treatment well Patient left: in chair;with call bell/phone within reach Nurse Communication: Mobility status         Time: 1120-1145 PT Time Calculation (min) (ACUTE ONLY): 25 min   Charges:   PT Evaluation $PT Eval Moderate Complexity: 1 Procedure PT Treatments $Gait Training: 8-22 mins   PT G Codes:        Olen PelGarrigan, Conley Pawling Hamff 12/06/2015, 1:44 PM  Van ClinesHolly Sriya Kroeze, [place]  Acute Rehabilitation Services Pager [phone] Office [phone]

## 2015-12-07 LAB — CBC
HCT: 36.8 % (ref 36.0–46.0)
Hemoglobin: 11.7 g/dL — ABNORMAL LOW (ref 12.0–15.0)
MCH: 29 pg (ref 26.0–34.0)
MCHC: 31.8 g/dL (ref 30.0–36.0)
MCV: 91.1 fL (ref 78.0–100.0)
Platelets: 167 10*3/uL (ref 150–400)
RBC: 4.04 MIL/uL (ref 3.87–5.11)
RDW: 12.3 % (ref 11.5–15.5)
WBC: 9.7 10*3/uL (ref 4.0–10.5)

## 2015-12-07 NOTE — Progress Notes (Signed)
[facility] Child psychotherapistsocial worker did not get insurance authorization for rehab, and that bed will not be ready today. Per social worker discharge should be ready for tomorrow. Left a message with receptionist.

## 2015-12-07 NOTE — Evaluation (Signed)
Occupational Therapy Evaluation Patient Details Name: Brandi Rivas MRN: [id number] DOB: 21-Aug-1960 Today's Date: 12/07/2015    History of Present Illness Admitted for RTKA;  has a past medical history of Acute urinary retention (12/10/2012); Arthritis; History of anemia; History of blood clots; Hypertension; Pneumonia (01/09/2011); Sinus congestion; Urinary frequency; Ventral hernia; and Wears glasses.  has a past surgical history that includes Arthroscopy knee w/ drilling (Bilateral); Ventral hernia repair (N/A, 12/09/2012); Insertion of mesh (N/A, 12/09/2012); Colonoscopy; Esophagogastroduodenoscopy; and Total knee arthroplasty (Left, 05/16/2015).   Clinical Impression   Pt reports she was independent with ADL PTA. Currently pt overall min assist for functional mobility and mod assist for LB ADL. Educated pt on knee precautions and use of zero degree knee (applied at end of session). Pt planning to d/c to SNF for continued rehab prior to return home; agree with SNF placement at this time. Will defer all further OT needs to the next venue of care. Please re-consult if needs change. Thank you for this referral.    Follow Up Recommendations  SNF;Supervision/Assistance - 24 hour    Equipment Recommendations  Other (comment) (TBD at next venue)    Recommendations for Other Services       Precautions / Restrictions Precautions Precautions: Knee Precaution Comments: Educated pt on knee precuations and use of zero degree knee Restrictions Weight Bearing Restrictions: Yes RLE Weight Bearing: Weight bearing as tolerated      Mobility Bed Mobility Overal bed mobility: Needs Assistance Bed Mobility: Supine to Sit     Supine to sit: Mod assist     General bed mobility comments: Mod assist for LLE to EOB.  Transfers Overall transfer level: Needs assistance Equipment used: Rolling walker (2 wheeled) Transfers: Sit to/from Stand Sit to Stand: Min assist         General  transfer comment: Min assist to boost up from EOB, min guard for sit to [facility] from [facility]. Pt with good hand placement and technique.    Balance Overall balance assessment: Needs assistance Sitting-balance support: Feet supported;Single extremity supported Sitting balance-Leahy Scale: Fair     Standing balance support: Bilateral upper extremity supported;During functional activity Standing balance-Leahy Scale: Poor Standing balance comment: UE support for balance                            ADL Overall ADL's : Needs assistance/impaired Eating/Feeding: Independent;Sitting   Grooming: Min guard;Standing;Wash/dry hands   Upper Body Bathing: Set up;Supervision/ safety;Sitting   Lower Body Bathing: Moderate assistance;Sit to/from stand   Upper Body Dressing : Set up;Supervision/safety;Sitting Upper Body Dressing Details (indicate cue type and reason): to don hospital gown Lower Body Dressing: Maximal assistance;Sit to/from stand   Toilet Transfer: Minimal assistance;Ambulation;BSC;RW   Toileting- Clothing Manipulation and Hygiene: Min guard;Sitting/lateral lean Toileting - Clothing Manipulation Details (indicate cue type and reason): for peri care only     Functional mobility during ADLs: Minimal assistance;Rolling walker General ADL Comments: Educated pt on no pillow under knee, use of zero degree knee.     Vision     Perception     Praxis      Pertinent Vitals/Pain Pain Assessment: 0-10 Pain Score: 6  Pain Location: R knee Pain Descriptors / Indicators: Aching;Grimacing;Guarding Pain Intervention(s): Monitored during session;Repositioned;Ice applied     Hand Dominance     Extremity/Trunk Assessment Upper Extremity Assessment Upper Extremity Assessment: Overall WFL for tasks assessed   Lower Extremity Assessment Lower Extremity Assessment: Defer  to PT evaluation   Cervical / Trunk Assessment Cervical / Trunk Assessment: Normal   Communication  Communication Communication: No difficulties   Cognition Arousal/Alertness: Awake/alert Behavior During Therapy: WFL for tasks assessed/performed Overall Cognitive Status: Within Functional Limits for tasks assessed                     General Comments       Exercises       Shoulder Instructions      Home Living Family/patient expects to be discharged to:: Skilled nursing facility                                 Additional Comments: [facility]      Prior Functioning/Environment Level of Independence: Independent                 OT Problem List:     OT Treatment/Interventions:      OT Goals(Current goals can be found in the care plan section) Acute Rehab OT Goals Patient Stated Goal: rehab before home OT Goal Formulation: All assessment and education complete, DC therapy  OT Frequency:     Barriers to D/C:            Co-evaluation              End of Session Equipment Utilized During Treatment: Rolling walker;Gait belt CPM Right Knee CPM Right Knee: Off   Activity Tolerance: Patient tolerated treatment well Patient left: in chair;with call bell/phone within reach   Time: 0802-0830 OT Time Calculation (min): 28 min Charges:  OT General Charges $OT Visit: 1 Procedure OT Evaluation $OT Eval Moderate Complexity: 1 Procedure OT Treatments $Self Care/Home Management : 8-22 mins G-Codes:     Gaye AlkenBailey A Ariston Grandison M.S., OTR/L Pager: [phone]  12/07/2015, 8:35 AM

## 2015-12-07 NOTE — Progress Notes (Signed)
Stopped by to visit with patient. Provided spiritual/emotional support and requested prayer. Patient is Brandi Rivas, and prayed with me throughout, Thank you, Jesus, Thank you, Darden RestaurantsLord Jesus. Chaplain available for follow-up.   12/07/15 1600  Clinical Encounter Type  Visited With Patient  Visit Type Initial;Psychological support;Spiritual support  Referral From Chaplain  Spiritual Encounters  Spiritual Needs Prayer;Emotional;Other (Comment) (spiritual support)  Stress Factors  Patient Stress Factors Health changes  Family Stress Factors None identified

## 2015-12-07 NOTE — Progress Notes (Signed)
[name]  MRN: [id number]  DOB/AGE:  54-Dec-1962 / 54 y.o.  2 Days Post-Op Procedure(s) (LRB): TOTAL KNEE ARTHROPLASTY (Right)    PROGRESS NOTE Subjective: Patient is alert, oriented, no Nausea, no Vomiting, yes passing gas. Taking PO well. Denies SOB, Chest or Calf Pain. Using Incentive Spirometer, PAS in place. Ambulate WBAT with pt walking 85 ft with therapy, CPM 10-40 Patient reports pain as 7/10 .    Objective: Vital signs in last 24 hours: Vitals:   12/06/15 0900 12/06/15 1419 12/06/15 2034 12/07/15 0713  BP:  133/78 (!) 130/92 119/76  Pulse:  (!) 115 (!) 113 (!) 114  Resp:  16 18 16   Temp:  98.7 F (37.1 C) 98.9 F (37.2 C) 98.7 F (37.1 C)  TempSrc:   Oral Oral  SpO2: 97% 98% 98% 94%  Weight:      Height:          Intake/Output from previous day: I/O last 3 completed shifts: In: 1225.8 [P.O.:380; I.V.:845.8] Out: -    Intake/Output this shift: No intake/output data recorded.   LABORATORY DATA:  Recent Labs  12/06/15 0552 12/07/15 0618  WBC 8.6 9.7  HGB 11.0* 11.7*  HCT 34.5* 36.8  PLT 165 167  NA 131*  --   K 4.4  --   CL 102  --   CO2 24  --   BUN 11  --   CREATININE 0.70  --   GLUCOSE 130*  --   CALCIUM 8.1*  --     Examination: Neurologically intact Neurovascular intact Sensation intact distally Intact pulses distally Dorsiflexion/Plantar flexion intact Incision: dressing C/D/I No cellulitis present Compartment soft}  Assessment:   2 Days Post-Op Procedure(s) (LRB): TOTAL KNEE ARTHROPLASTY (Right) ADDITIONAL DIAGNOSIS: Expected Acute Blood Loss Anemia,  hx DVT, obesity  Plan: PT/OT WBAT, CPM 5/hrs day until ROM 0-90 degrees, then D/C CPM DVT Prophylaxis:  SCDx72hrs, Eliquis 2.5 mg BID x 2 weeks DISCHARGE PLAN: Skilled Nursing Facility/Rehab DISCHARGE NEEDS: HHPT, CPM, Walker and 3-in-1 comode seat     Brandi Rivas R 12/07/2015, 7:50 AM

## 2015-12-07 NOTE — Discharge Summary (Addendum)
Patient ID: Batul D Koury MRN: [id number] DOB/AGE: 06-08-1960 55 y.o.  Admit date: 12/05/2015 Discharge date: 12/08/2015 Admission Diagnoses:  Principal Problem:   Primary osteoarthritis of right knee   Discharge Diagnoses:  Same  Past Medical History:  Diagnosis Date  . Acute urinary retention 12/10/2012  . Arthritis   . History of anemia    "while pregnant"  . History of blood clots   . Hypertension    PREVIOUSLY TOOK MED - BP STABLE - TAKEN OFF MED  . Pneumonia 01/09/2011  . Sinus congestion   . Urinary frequency   . Ventral hernia   . Wears glasses     Surgeries: Procedure(s): TOTAL KNEE ARTHROPLASTY on 12/05/2015   Consultants:   Discharged Condition: Improved  Hospital Course: Trudi D Eliezer ChampagneBrittian is an 55 y.o. female who was admitted 12/05/2015 for operative treatment ofPrimary osteoarthritis of right knee. Patient has severe unremitting pain that affects sleep, daily activities, and work/hobbies. After pre-op clearance the patient was taken to the operating room on 12/05/2015 and underwent  Procedure(s): TOTAL KNEE ARTHROPLASTY.    Patient was given perioperative antibiotics: Anti-infectives    Start     Dose/Rate Route Frequency Ordered Stop   12/05/15 0932  ceFAZolin (ANCEF) 2-4 GM/100ML-% IVPB    Comments:  Shireen Quanodd, Robert   : cabinet override      12/05/15 0932 12/05/15 1125   12/05/15 0927  ceFAZolin (ANCEF) IVPB 2g/100 mL premix     2 g 200 mL/hr over 30 Minutes Intravenous On call to O.R. 12/05/15 0927 12/05/15 1140       Patient was given sequential compression devices, early ambulation, and chemoprophylaxis to prevent DVT.  Patient benefited maximally from hospital stay and there were no complications.    Recent vital signs: Patient Vitals for the past 24 hrs:  BP Temp Temp src Pulse Resp SpO2  12/07/15 0713 119/76 98.7 F (37.1 C) Oral (!) 114 16 94 %  12/06/15 2034 (!) 130/92 98.9 F (37.2 C) Oral (!) 113 18 98 %  12/06/15 1419 133/78 98.7  F (37.1 C) - (!) 115 16 98 %  12/06/15 0900 - - - - - 97 %     Recent laboratory studies:  Recent Labs  12/06/15 0552 12/07/15 0618  WBC 8.6 9.7  HGB 11.0* 11.7*  HCT 34.5* 36.8  PLT 165 167  NA 131*  --   K 4.4  --   CL 102  --   CO2 24  --   BUN 11  --   CREATININE 0.70  --   GLUCOSE 130*  --   CALCIUM 8.1*  --      Discharge Medications:     Medication List    TAKE these medications   acetaminophen 500 MG tablet Commonly known as:  TYLENOL Take 500-1,000 mg by mouth every 6 (six) hours as needed.   albuterol 108 (90 Base) MCG/ACT inhaler Commonly known as:  PROVENTIL HFA;VENTOLIN HFA Inhale 1-2 puffs into the lungs every 6 (six) hours as needed for wheezing or shortness of breath.   apixaban 2.5 MG Tabs tablet Commonly known as:  ELIQUIS Take 1 tablet (2.5 mg total) by mouth 2 (two) times daily.   beclomethasone 40 MCG/ACT inhaler Commonly known as:  QVAR Inhale 2 puffs into the lungs every 12 (twelve) hours as needed.   diclofenac sodium 1 % Gel Commonly known as:  VOLTAREN Apply 1 application topically 3 (three) times daily as needed.   fluticasone 50 MCG/ACT nasal  spray Commonly known as:  FLONASE Place 2 sprays into the nose daily as needed for rhinitis.   HYDROcodone-acetaminophen 5-325 MG tablet Commonly known as:  NORCO/VICODIN Take 1 tablet by mouth every 4 (four) hours as needed for moderate pain. What changed:  when to take this   methocarbamol 500 MG tablet Commonly known as:  ROBAXIN Take 1 tablet (500 mg total) by mouth 2 (two) times daily with a meal.   traMADol 50 MG tablet Commonly known as:  ULTRAM Take 50 mg by mouth every 12 (twelve) hours as needed.       Diagnostic Studies: No results found.  Disposition: 01-Home or Self Care  Discharge Instructions    CPM    Complete by:  As directed    Continuous passive motion machine (CPM):      Use the CPM from 0 to 60  for 5 hours per day.      You may increase by 10 degrees  per day.  You may break it up into 2 or 3 sessions per day.      Use CPM for 2 weeks or until you are told to stop.   Call MD / Call 911    Complete by:  As directed    If you experience chest pain or shortness of breath, CALL 911 and be transported to the hospital emergency room.  If you develope a fever above 101 F, pus (white drainage) or increased drainage or redness at the wound, or calf pain, call your surgeon's office.   Constipation Prevention    Complete by:  As directed    Drink plenty of fluids.  Prune juice may be helpful.  You may use a stool softener, such as Colace (over the counter) 100 mg twice a day.  Use MiraLax (over the counter) for constipation as needed.   Diet - low sodium heart healthy    Complete by:  As directed    Driving restrictions    Complete by:  As directed    No driving for 2 weeks   Increase activity slowly as tolerated    Complete by:  As directed    Patient may shower    Complete by:  As directed    You may shower without a dressing once there is no drainage.  Do not wash over the wound.  If drainage remains, cover wound with plastic wrap and then shower.      Follow-up Information    Nestor LewandowskyOWAN,FRANK J, MD Follow up in 2 week(s).   Specialty:  Orthopedic Surgery Contact information: [address] [place] [address] [phone]            Signed: Vear ClockHILLIPS, Zehra Rucci R 12/07/2015, 7:53 AM

## 2015-12-07 NOTE — Progress Notes (Signed)
Physical Therapy Treatment Patient Details Name: Brandi Rivas MRN: [id number] DOB: Jul 10, 1960 Today's Date: 12/07/2015    History of Present Illness Admitted for RTKA;  has a past medical history of Acute urinary retention (12/10/2012); Arthritis; History of anemia; History of blood clots; Hypertension; Pneumonia (01/09/2011); Sinus congestion; Urinary frequency; Ventral hernia; and Wears glasses.  has a past surgical history that includes Arthroscopy knee w/ drilling (Bilateral); Ventral hernia repair (N/A, 12/09/2012); Insertion of mesh (N/A, 12/09/2012); Colonoscopy; Esophagogastroduodenoscopy; and Total knee arthroplasty (Left, 05/16/2015).    PT Comments    Patient is making gradual progress toward mobility goals. Patient tolerated increased gait distance and worked on knee flexion this session. Pt given HEP handout. Continue to progress as tolerated with anticipated d/c to SNF for further skilled PT services.    Follow Up Recommendations  SNF     Equipment Recommendations  None recommended by PT    Recommendations for Other Services       Precautions / Restrictions Precautions Precautions: Knee Precaution Comments: reviewed precautions Restrictions Weight Bearing Restrictions: Yes RLE Weight Bearing: Weight bearing as tolerated    Mobility  Bed Mobility Overal bed mobility: Needs Assistance Bed Mobility: Supine to Sit;Sit to Supine     Supine to sit: Min assist Sit to supine: Min assist   General bed mobility comments: pt OOB in chair upon arrival  Transfers Overall transfer level: Needs assistance Equipment used: Rolling walker (2 wheeled) Transfers: Sit to/from Stand Sit to Stand: Min assist;Min guard         General transfer comment: min A first trial from recliner to steady during transition of hand placement and positioning R LE for improved BOS; min guard from [facility] for safety; cues for hand placement and technique  Ambulation/Gait Ambulation/Gait  assistance: Min guard Ambulation Distance (Feet): 80 Feet Assistive device: Rolling walker (2 wheeled) Gait Pattern/deviations: Step-to pattern;Step-through pattern;Decreased stance time - right;Decreased stride length;Decreased weight shift to right Gait velocity: slow Gait velocity interpretation: Below normal speed for age/gender General Gait Details: cues for posture, safe use of AD, bilat step length and heel strike; pt with improved step through pattern and R knee flexion with increased distance   Stairs            Wheelchair Mobility    Modified Rankin (Stroke Patients Only)       Balance     Sitting balance-Leahy Scale: Fair       Standing balance-Leahy Scale: Poor                      Cognition Arousal/Alertness: Awake/alert Behavior During Therapy: WFL for tasks assessed/performed;Flat affect Overall Cognitive Status: Within Functional Limits for tasks assessed                      Exercises Total Joint Exercises Ankle Circles/Pumps: AROM;Both;20 reps Quad Sets: AROM;Both;20 reps Short Arc QuadBarbaraann Boys: AAROM;Right;10 reps Heel Slides: AAROM;Right;10 reps (significant flexion limitations) Straight Leg Raises: AAROM;Right;10 reps Knee Flexion: AROM;Right;Other (comment);10 reps;Seated;AAROM (worked on small increases in knee flexion with 10 sec holds) Goniometric ROM: approx 5 to 35 deg passivley    General Comments General comments (skin integrity, edema, etc.): pt given HEP handout and encouraged to work on knee flexion every hour of resting in knee extension      Pertinent Vitals/Pain Pain Assessment: 0-10 Pain Score: 6  Faces Pain Scale: Hurts whole lot Pain Location: R knee and thigh Pain Descriptors / Indicators: Aching;Guarding;Sore Pain Intervention(s): Limited  activity within patient's tolerance;Monitored during session;Repositioned;RN gave pain meds during session    Home Living                      Prior Function             PT Goals (current goals can now be found in the care plan section) Acute Rehab PT Goals Patient Stated Goal: get better PT Goal Formulation: With patient Time For Goal Achievement: 12/13/15 Potential to Achieve Goals: Good Progress towards PT goals: Progressing toward goals    Frequency    7X/week      PT Plan Current plan remains appropriate    Co-evaluation             End of Session Equipment Utilized During Treatment: Gait belt Activity Tolerance: Patient tolerated treatment well Patient left: in chair;with call bell/phone within reach     Time: [id number] PT Time Calculation (min) (ACUTE ONLY): 42 min  Charges:  $Gait Training: 8-22 mins $Therapeutic Exercise: 8-22 mins $Therapeutic Activity: 8-22 mins                    G Codes:      Derek MoundKellyn R Izaiyah Kleinman Mechel Schutter, PTA Pager: [phone]   12/07/2015, 3:18 PM

## 2015-12-07 NOTE — Progress Notes (Signed)
Orthopedic Tech Progress Note Patient Details:  Brandi Rivas 01/06/1961 [id number]  [name], female   DOB: 01/06/1961, 55 y.o.   MRN: [id number] Applied cpm 10-45  Brandi Rivas, Brandi Rivas 12/07/2015, 6:20 AM

## 2015-12-07 NOTE — Progress Notes (Signed)
Physical Therapy Treatment Patient Details Name: Brandi Rivas MRN: [id number] DOB: 1960/02/10 Today's Date: 12/07/2015    History of Present Illness Admitted for RTKA;  has a past medical history of Acute urinary retention (12/10/2012); Arthritis; History of anemia; History of blood clots; Hypertension; Pneumonia (01/09/2011); Sinus congestion; Urinary frequency; Ventral hernia; and Wears glasses.  has a past surgical history that includes Arthroscopy knee w/ drilling (Bilateral); Ventral hernia repair (N/A, 12/09/2012); Insertion of mesh (N/A, 12/09/2012); Colonoscopy; Esophagogastroduodenoscopy; and Total knee arthroplasty (Left, 05/16/2015).    PT Comments    Continuing work on progressive amb and activity tolerance, as well as knee therex and ROM; significantly limited by pain today, but Brandi Rivas is still very willing to participate; continue to recommend SNF for post-acute rehab  Follow Up Recommendations  SNF     Equipment Recommendations  None recommended by PT    Recommendations for Other Services       Precautions / Restrictions Precautions Precautions: Knee Precaution Comments: Educated pt on knee precuations and use of zero degree knee Restrictions RLE Weight Bearing: Weight bearing as tolerated    Mobility  Bed Mobility Overal bed mobility: Needs Assistance Bed Mobility: Supine to Sit;Sit to Supine     Supine to sit: Min assist Sit to supine: Min assist   General bed mobility comments: Min assist for RLE  Transfers Overall transfer level: Needs assistance Equipment used: Rolling walker (2 wheeled) Transfers: Sit to/from Stand Sit to Stand: Min assist;Min guard         General transfer comment: Min assist to boost up from EOB, min guard for sit to [facility]. Pt with good hand placement and technique.  Ambulation/Gait Ambulation/Gait assistance: Min guard;Supervision Ambulation Distance (Feet): 30 Feet Assistive device: Rolling walker (2  wheeled) Gait Pattern/deviations: Step-to pattern;Decreased stance time - right;Decreased step length - left Gait velocity: slow Gait velocity interpretation: Below normal speed for age/gender General Gait Details: Cues for sequence, optimal step length, and to use RW to unweigh painful R knee in stance; nice, stable RLE in stance   Stairs            Wheelchair Mobility    Modified Rankin (Stroke Patients Only)       Balance     Sitting balance-Leahy Scale: Fair       Standing balance-Leahy Scale: Poor                      Cognition Arousal/Alertness: Awake/alert Behavior During Therapy: WFL for tasks assessed/performed;Flat affect Overall Cognitive Status: Within Functional Limits for tasks assessed                      Exercises Total Joint Exercises Ankle Circles/Pumps: AROM;Both;20 reps Quad Sets: AROM;Both;20 reps Short Arc QuadBarbaraann Rivas: AAROM;Right;10 reps Heel Slides: AAROM;Right;10 reps (significant flexion limitations) Straight Leg Raises: AAROM;Right;10 reps Goniometric ROM: approx 5 to 35 deg passivley    General Comments        Pertinent Vitals/Pain Pain Assessment: Faces Faces Pain Scale: Hurts whole lot Pain Location: R Knee Pain Descriptors / Indicators: Aching;Grimacing Pain Intervention(s): Monitored during session    Home Living                      Prior Function            PT Goals (current goals can now be found in the care plan section) Acute Rehab PT Goals Patient Stated Goal: rehab before  home PT Goal Formulation: With patient Time For Goal Achievement: 12/13/15 Potential to Achieve Goals: Good Progress towards PT goals: Progressing toward goals (though limited by pain this session)    Frequency    7X/week      PT Plan Current plan remains appropriate    Co-evaluation             End of Session Equipment Utilized During Treatment: Gait belt Activity Tolerance: Patient limited by  pain Patient left: in chair;with call bell/phone within reach     Time: 1156-1229 PT Time Calculation (min) (ACUTE ONLY): 33 min  Charges:  $Gait Training: 8-22 mins $Therapeutic Exercise: 8-22 mins                    G Codes:      Brandi Rivas, Brandi Rivas 12/07/2015, 1:37 PM  Brandi Rivas, [place]  Acute Rehabilitation Services Pager [phone] Office [phone]

## 2015-12-08 ENCOUNTER — Encounter (HOSPITAL_COMMUNITY): Payer: Self-pay

## 2015-12-08 LAB — CBC
HCT: 34 % — ABNORMAL LOW (ref 36.0–46.0)
Hemoglobin: 10.8 g/dL — ABNORMAL LOW (ref 12.0–15.0)
MCH: 29 pg (ref 26.0–34.0)
MCHC: 31.8 g/dL (ref 30.0–36.0)
MCV: 91.4 fL (ref 78.0–100.0)
Platelets: 152 10*3/uL (ref 150–400)
RBC: 3.72 MIL/uL — ABNORMAL LOW (ref 3.87–5.11)
RDW: 12.5 % (ref 11.5–15.5)
WBC: 8.1 10*3/uL (ref 4.0–10.5)

## 2015-12-08 NOTE — Progress Notes (Signed)
  [facility] Was finally able to be connected to DON to leave msg for her to call [phone] to receive report on pt being admitted to their facility today.

## 2015-12-08 NOTE — Progress Notes (Addendum)
SW [facility] states to send the pt to facility at 1:00pm. [facility] SW made nurse and pt aware.   Nurse Report : [phone]  Crista CurbBrittney Saladin Petrelli, MSW [phone] 12/08/2015 11:50 AM

## 2015-12-08 NOTE — Progress Notes (Signed)
Orthopedic Tech Progress Note Patient Details:  Brandi Rivas 1960/03/28 [id number]  Patient ID: [name], female   DOB: 1960/03/28, 55 y.o.   MRN: [id number] Applied cpm 10-45  Trinna PostMartinez, Naylani Bradner J 12/08/2015, 6:28 AM

## 2015-12-08 NOTE — Progress Notes (Signed)
[name]  MRN: [id number]  DOB/AGE:  1960-03-15 / 55 y.o.  3 Days Post-Op Procedure(s) (LRB): TOTAL KNEE ARTHROPLASTY (Right)    PROGRESS NOTE Subjective: Patient is alert, oriented, no Nausea, no Vomiting, yes passing gas. Taking PO well. Denies SOB, Chest or Calf Pain. Using Incentive Spirometer, PAS in place. Ambulate 80 ft, CPM 0-60 Patient reports pain as 4/10 .    Objective: Vital signs in last 24 hours: Vitals:   12/07/15 1456 12/07/15 2005 12/07/15 2124 12/08/15 0500  BP: 111/73  123/85 114/81  Pulse: (!) 119  (!) 114 (!) 114  Resp: 17  18 18   Temp: 98.8 F (37.1 C)  99.3 F (37.4 C) 99 F (37.2 C)  TempSrc: Oral  Oral Oral  SpO2: 95% 96% 97% 99%  Weight:      Height:          Intake/Output from previous day: I/O last 3 completed shifts: In: 480 [P.O.:480] Out: -    Intake/Output this shift: No intake/output data recorded.   LABORATORY DATA:  Recent Labs  12/06/15 0552 12/07/15 0618  WBC 8.6 9.7  HGB 11.0* 11.7*  HCT 34.5* 36.8  PLT 165 167  NA 131*  --   K 4.4  --   CL 102  --   CO2 24  --   BUN 11  --   CREATININE 0.70  --   GLUCOSE 130*  --   CALCIUM 8.1*  --     Examination: Neurologically intact ABD soft Neurovascular intact Sensation intact distally Intact pulses distally Dorsiflexion/Plantar flexion intact Incision: dressing C/D/I No cellulitis present Compartment soft}  Assessment:   3 Days Post-Op Procedure(s) (LRB): TOTAL KNEE ARTHROPLASTY (Right) ADDITIONAL DIAGNOSIS: Expected Acute Blood Loss Anemia, hx PE  Plan: PT/OT WBAT, CPM 5/hrs day until ROM 0-90 degrees, then D/C CPM DVT Prophylaxis:  SCDx72hrs, ASA 325 mg BID x 2 weeks DISCHARGE PLAN: Skilled Nursing Facility/Rehab, [facility] today DISCHARGE NEEDS: HHPT, CPM, Walker and 3-in-1 comode seat     Brandi Rivas J 12/08/2015, 7:11 AM [name], female   DOB: 1960-03-15, 55 y.o.   MRN: [id number]

## 2015-12-09 ENCOUNTER — Non-Acute Institutional Stay (SKILLED_NURSING_FACILITY): Payer: BLUE CROSS/BLUE SHIELD | Admitting: Adult Health

## 2015-12-09 ENCOUNTER — Encounter: Payer: Self-pay | Admitting: Adult Health

## 2015-12-09 DIAGNOSIS — R2681 Unsteadiness on feet: Secondary | ICD-10-CM | POA: Diagnosis not present

## 2015-12-09 DIAGNOSIS — J309 Allergic rhinitis, unspecified: Secondary | ICD-10-CM

## 2015-12-09 DIAGNOSIS — M25561 Pain in right knee: Secondary | ICD-10-CM | POA: Diagnosis not present

## 2015-12-09 DIAGNOSIS — K5901 Slow transit constipation: Secondary | ICD-10-CM

## 2015-12-09 DIAGNOSIS — M25661 Stiffness of right knee, not elsewhere classified: Secondary | ICD-10-CM | POA: Diagnosis not present

## 2015-12-09 DIAGNOSIS — I10 Essential (primary) hypertension: Secondary | ICD-10-CM | POA: Diagnosis not present

## 2015-12-09 DIAGNOSIS — M1711 Unilateral primary osteoarthritis, right knee: Secondary | ICD-10-CM | POA: Diagnosis not present

## 2015-12-09 DIAGNOSIS — E871 Hypo-osmolality and hyponatremia: Secondary | ICD-10-CM

## 2015-12-09 DIAGNOSIS — M6281 Muscle weakness (generalized): Secondary | ICD-10-CM | POA: Diagnosis not present

## 2015-12-09 DIAGNOSIS — D62 Acute posthemorrhagic anemia: Secondary | ICD-10-CM

## 2015-12-09 NOTE — Progress Notes (Signed)
[name], female   DOB: 1960-09-03, 55 y.o.   MRN: [id number]    DATE:    12/09/15  MRN:  [id number]  BIRTHDAY: 1960-09-03  Facility:  Nursing Home Location:  [facility]  Nursing Home Room Number: 703-P  LEVEL OF CARE:  SNF [phone]  Contact Information    Name Relation Home Work [employer]   Vroom,Shawn Son [phone]  [phone]   Rush,Cora Mother [phone]  [phone]   Scicchitano,Tasha Daughter [phone]  [phone]   No name specified           Code Status History    Date Active Date Inactive Code Status Order ID Comments User Context   12/05/2015  1:36 PM 12/08/2015  5:18 PM Full Code [id number]  Gean BirchwoodFrank Rowan, MD Inpatient   04/18/2013  1:09 AM 04/20/2013  4:23 PM Full Code [id number]  Therisa DoyneAnastassia Doutova, MD Inpatient   12/09/2012 11:54 AM 12/11/2012  2:40 PM Full Code [id number]  Karie SodaSteven Gross, MD Inpatient   10/20/2011  2:33 AM 10/20/2011  3:09 PM Full Code [id number]  Vida RollerBrian D Miller, MD ED   01/10/2011  2:04 AM 01/12/2011  7:28 PM Full Code [id number]  Hiram ComberJOLLY, TRAVIS E ED       Chief Complaint  Patient presents with  . Hospitalization Follow-up    HISTORY OF PRESENT ILLNESS:  This is a 55 year old female who has been [facility] 12/05/15 to 12/07/15. She has PMH of urinary frequency, ventral hernia, sinus congestion and hypertension. She has  Primary osteoarthritis of right knee for which she had total knee arthroplasty on 12/05/15.  She was seen in her room today and complained of 10/10 pain on her right knee and constipation.  She has been admitted for a short-term rehabilitation.    PAST MEDICAL HISTORY:  Past Medical History:  Diagnosis Date  . Acute urinary retention 12/10/2012  . Arthritis   . History of anemia    "while pregnant"  . History of blood clots   . Hypertension    PREVIOUSLY TOOK MED - BP STABLE - TAKEN OFF MED  . Pneumonia 01/09/2011  .  Sinus congestion   . Urinary frequency   . Ventral hernia   . Wears glasses      CURRENT MEDICATIONS: Reviewed  Patient's Medications  New Prescriptions   No medications on file  Previous Medications   ACETAMINOPHEN (TYLENOL) 500 MG TABLET    Take 1,000 mg by mouth every 6 (six) hours as needed.    ALBUTEROL (PROVENTIL HFA;VENTOLIN HFA) 108 (90 BASE) MCG/ACT INHALER    Inhale 2 puffs into the lungs every 6 (six) hours as needed for wheezing or shortness of breath.   APIXABAN (ELIQUIS) 2.5 MG TABS TABLET    Take 1 tablet (2.5 mg total) by mouth 2 (two) times daily.   BECLOMETHASONE (QVAR) 40 MCG/ACT INHALER    Inhale 2 puffs into the lungs every 12 (twelve) hours as needed.    DICLOFENAC SODIUM (VOLTAREN) 1 % GEL    Apply 1 application topically 3 (three) times daily as needed.   FLUTICASONE (FLONASE) 50 MCG/ACT NASAL SPRAY    Place 2 sprays into the nose daily as needed for rhinitis.   HYDROCODONE-ACETAMINOPHEN (NORCO/VICODIN) 5-325 MG TABLET    Take 1 tablet by mouth every 4 (four) hours as needed for moderate pain.   METHOCARBAMOL (ROBAXIN) 500 MG TABLET    Take 1 tablet (500 mg total) by  mouth 2 (two) times daily with a meal.   TRAMADOL (ULTRAM) 50 MG TABLET    Take 50 mg by mouth every 12 (twelve) hours as needed.  Modified Medications   No medications on file  Discontinued Medications   ALBUTEROL (PROVENTIL HFA;VENTOLIN HFA) 108 (90 BASE) MCG/ACT INHALER    Inhale 1-2 puffs into the lungs every 6 (six) hours as needed for wheezing or shortness of breath.     Allergies  Allergen Reactions  . Oxycodone Itching and Rash     REVIEW OF SYSTEMS:  GENERAL: no change in appetite, no fatigue, no weight changes, no fever, chills or weakness EYES: Denies change in vision, dry eyes, eye pain, itching or discharge EARS: Denies change in hearing, ringing in ears, or earache NOSE: Denies nasal congestion or epistaxis MOUTH and THROAT: Denies oral discomfort, gingival pain or bleeding,  pain from teeth or hoarseness   RESPIRATORY: no cough, SOB, DOE, wheezing, hemoptysis CARDIAC: no chest pain,  or palpitations GI: no abdominal pain, diarrhea, heart burn, nausea or vomiting, +constipation GU: Denies dysuria, frequency, hematuria, incontinence, or discharge PSYCHIATRIC: Denies feeling of depression or anxiety. No report of hallucinations, insomnia, paranoia, or agitation    PHYSICAL EXAMINATION  GENERAL APPEARANCE: Well nourished. In no acute distress. Obese SKIN:  Right knee surgical incision has aquacel dressing, dry and no erythema HEAD: Normal in size and contour. No evidence of trauma EYES: Lids open and close normally. No blepharitis, entropion or ectropion. PERRL. Conjunctivae are clear and sclerae are white. Lenses are without opacity EARS: Pinnae are normal. Patient hears normal voice tunes of the examiner MOUTH and THROAT: Lips are without lesions. Oral mucosa is moist and without lesions. Tongue is normal in shape, size, and color and without lesions NECK: supple, trachea midline, no neck masses, no thyroid tenderness, no thyromegaly LYMPHATICS: no LAN in the neck, no supraclavicular LAN RESPIRATORY: breathing is even & unlabored, BS CTAB CARDIAC: RRR, no murmur,no extra heart sounds, RLE 1+ edema GI: abdomen soft, normal BS, no masses, no tenderness, no hepatomegaly, no splenomegaly EXTREMITIES:  Able to move X 4 extremities PSYCHIATRIC: Alert and oriented X 3. Affect and behavior are appropriate  LABS/RADIOLOGY: Labs reviewed: Basic Metabolic Panel:  Recent Labs  57/84/6902/12/23 0702 05/20/15 11/28/15 1239 12/06/15 0552  NA 133* 135* 139 131*  K 4.0 3.5 3.5 4.4  CL 103  --  109 102  CO2 23  --  23 24  GLUCOSE 138*  --  86 130*  BUN 8 11 9 11   CREATININE 0.78 0.7 0.76 0.70  CALCIUM 8.3*  --  8.6* 8.1*   Liver Function Tests:  Recent Labs  05/20/15  AST 11*  ALT 8  ALKPHOS 39   CBC:  Recent Labs  05/04/15 1525  05/20/15 11/28/15 1239  12/06/15 0552 12/07/15 0618 12/08/15 0726  WBC 4.8  < > 4.4 5.4 8.6 9.7 8.1  NEUTROABS 2.1  --  2 2.9  --   --   --   HGB 11.5*  < > 8.4* 11.8* 11.0* 11.7* 10.8*  HCT 37.3  < > 28* 38.0 34.5* 36.8 34.0*  MCV 89.0  < >  --  92.9 91.0 91.1 91.4  PLT 194  < > 211 201 165 167 152  < > = values in this interval not displayed.    ASSESSMENT/PLAN:  Unsteady gait - for rehabilitation, PT and OT, for therapeutic strengthening exercises; fall precaution  OA of right knee S/P total knee arthroplasty - for  rehabilitation, PT and OT, for therapeutic strengthening exercises; continue Ultram 50 mg 1 tab PO Q 12 hours PRN, Tylenol ES 500 mg 1 caplet PO Q 6 hours PRN and Norcon 5/325 mg 1 tab PO Q 4 hours PRN for pain; physiatry consult; Eliquis 2.5 mg 1 tab PO BID for DVT prophylaxis; Robaxin 500 mg 1 tab PO Q 6 hours PRN for muscle spasm; follow-up with orthopedics in 2 weeks  Allergic rhinitis - continue Flonase 50 mcg 2 sprays into nose Q D PRN  Constipation - start Senna-S 8.6-50 mg 2 tabs PO BID and Miralax 17 gm PO BID  Anemia, acute blood loss - re-check CBC Lab Results  Component Value Date   HGB 10.8 (L) 12/08/2015   Hyponatremia - Na 131; will monitor  Hypertension - on no medications @ this time; will monitor     Goals of care:  Short-term rehabilitation     Kenard Gower, NP [facility] [phone]

## 2015-12-11 DIAGNOSIS — R600 Localized edema: Secondary | ICD-10-CM | POA: Diagnosis not present

## 2015-12-12 DIAGNOSIS — D509 Iron deficiency anemia, unspecified: Secondary | ICD-10-CM | POA: Diagnosis not present

## 2015-12-12 DIAGNOSIS — M6281 Muscle weakness (generalized): Secondary | ICD-10-CM | POA: Diagnosis not present

## 2015-12-12 DIAGNOSIS — D649 Anemia, unspecified: Secondary | ICD-10-CM | POA: Diagnosis not present

## 2015-12-12 DIAGNOSIS — M25661 Stiffness of right knee, not elsewhere classified: Secondary | ICD-10-CM | POA: Diagnosis not present

## 2015-12-12 DIAGNOSIS — M25561 Pain in right knee: Secondary | ICD-10-CM | POA: Diagnosis not present

## 2015-12-12 DIAGNOSIS — R2681 Unsteadiness on feet: Secondary | ICD-10-CM | POA: Diagnosis not present

## 2015-12-12 LAB — CBC AND DIFFERENTIAL
HCT: 30 % — AB (ref 36–46)
Hemoglobin: 9.5 g/dL — AB (ref 12.0–16.0)
Neutrophils Absolute: 4 /uL
Platelets: 254 10*3/uL (ref 150–399)
WBC: 6.6 10^3/mL

## 2015-12-12 LAB — BASIC METABOLIC PANEL
BUN: 2 mg/dL — AB (ref 4–21)
Creatinine: 0.6 mg/dL (ref 0.5–1.1)
Glucose: 96 mg/dL
Potassium: 4.4 mmol/L (ref 3.4–5.3)
Sodium: 136 mmol/L — AB (ref 137–147)

## 2015-12-12 LAB — HEPATIC FUNCTION PANEL
ALT: 46 U/L — AB (ref 7–35)
AST: 32 U/L (ref 13–35)
Alkaline Phosphatase: 71 U/L (ref 25–125)
Bilirubin, Total: 1.8 mg/dL

## 2015-12-13 ENCOUNTER — Non-Acute Institutional Stay (SKILLED_NURSING_FACILITY): Payer: BLUE CROSS/BLUE SHIELD | Admitting: Internal Medicine

## 2015-12-13 ENCOUNTER — Encounter: Payer: Self-pay | Admitting: Internal Medicine

## 2015-12-13 DIAGNOSIS — D62 Acute posthemorrhagic anemia: Secondary | ICD-10-CM

## 2015-12-13 DIAGNOSIS — M25561 Pain in right knee: Secondary | ICD-10-CM | POA: Diagnosis not present

## 2015-12-13 DIAGNOSIS — I2782 Chronic pulmonary embolism: Secondary | ICD-10-CM

## 2015-12-13 DIAGNOSIS — E871 Hypo-osmolality and hyponatremia: Secondary | ICD-10-CM | POA: Diagnosis not present

## 2015-12-13 DIAGNOSIS — R2681 Unsteadiness on feet: Secondary | ICD-10-CM

## 2015-12-13 DIAGNOSIS — K5901 Slow transit constipation: Secondary | ICD-10-CM | POA: Diagnosis not present

## 2015-12-13 DIAGNOSIS — M1711 Unilateral primary osteoarthritis, right knee: Secondary | ICD-10-CM

## 2015-12-13 DIAGNOSIS — I10 Essential (primary) hypertension: Secondary | ICD-10-CM

## 2015-12-13 DIAGNOSIS — R600 Localized edema: Secondary | ICD-10-CM | POA: Diagnosis not present

## 2015-12-13 DIAGNOSIS — M6281 Muscle weakness (generalized): Secondary | ICD-10-CM | POA: Diagnosis not present

## 2015-12-13 NOTE — Progress Notes (Signed)
LOCATION: [facility]  PCP: Lupe Carney, MD   Code Status: Full Code  Goals of care: Advanced Directive information Advanced Directives 11/28/2015  Does patient have an advance directive? No  Would patient like information on creating an advanced directive? No - patient declined information  Pre-existing out of facility DNR order (yellow form or pink MOST form) -       Extended Emergency Contact Information Primary Emergency Contact: Schubach,Shawn Address: [address]          [place], [place] [address] [place] Home Phone: [phone] Mobile Phone: [phone] Relation: Son Secondary Emergency Contact: Ebony [place] of [place] Home Phone: [phone] Mobile Phone: [phone] Relation: Mother   Allergies  Allergen Reactions  . Oxycodone Itching and Rash    Chief Complaint  Patient presents with  . New Admit To SNF    New Admission Visit     HPI:  Patient is a 55 y.o. female seen today for short term rehabilitation post hospital admission from 12/05/15-12/08/15 with right knee osteoarthritis. She underwent right total knee arthroplasty. She is seen in her room today.   Review of Systems:  Constitutional: Negative for fever, chills, diaphoresis.  Energy level is slowly coming back. HENT: Negative for  congestion, nasal discharge, difficulty swallowing. Positive for occasional headaches.   Eyes: Negative for blurred vision, double vision and discharge.  Respiratory: Negative for cough, shortness of breath and wheezing.   Cardiovascular: Negative for chest pain, palpitations, leg swelling.  Gastrointestinal: Negative for heartburn, nausea, vomiting, abdominal pain. Last bowel movement was today.  Genitourinary: Negative for dysuria and flank pain.  Musculoskeletal: Negative for back pain, fall in the facility.  Skin: Negative for itching, rash.  Neurological: Negative for  dizziness. Psychiatric/Behavioral: Negative for depression   Past Medical History:  Diagnosis Date  . Acute urinary retention 12/10/2012  . Arthritis   . History of anemia    "while pregnant"  . History of blood clots   . Hypertension    PREVIOUSLY TOOK MED - BP STABLE - TAKEN OFF MED  . Pneumonia 01/09/2011  . Sinus congestion   . Urinary frequency   . Ventral hernia   . Wears glasses    Past Surgical History:  Procedure Laterality Date  . ARTHROSCOPY KNEE W/ DRILLING Bilateral   . COLONOSCOPY    . ESOPHAGOGASTRODUODENOSCOPY    . INSERTION OF MESH N/A 12/09/2012   Procedure: INSERTION OF MESH;  Surgeon: Ardeth Sportsman, MD;  Location: [facility];  Service: General;  Laterality: N/A;  . TOTAL KNEE ARTHROPLASTY Left 05/16/2015   Procedure: TOTAL KNEE ARTHROPLASTY;  Surgeon: Gean Birchwood, MD;  Location: [facility] OR;  Service: Orthopedics;  Laterality: Left;  . TOTAL KNEE ARTHROPLASTY Right 12/05/2015   Procedure: TOTAL KNEE ARTHROPLASTY;  Surgeon: Gean Birchwood, MD;  Location: [facility] OR;  Service: Orthopedics;  Laterality: Right;  . VENTRAL HERNIA REPAIR N/A 12/09/2012   Procedure: LAPAROSCOPIC VENTRAL WALL HERNIA REPAIR;  Surgeon: Ardeth Sportsman, MD;  Location: [facility];  Service: General;  Laterality: N/A;   Social History:   reports that she has never smoked. She has never used smokeless tobacco. She reports that she does not drink alcohol or use drugs.  Family History  Problem Relation Age of Onset  . Hypertension Mother   . Diabetes type II Mother   . Hypertension Father   . Colon cancer Father     Medications:  Medication List       Accurate as of 12/13/15  4:38 PM. Always use your most recent med list.          acetaminophen 500 MG tablet Commonly known as:  TYLENOL Take 1,000 mg by mouth every 6 (six) hours as needed.   albuterol 108 (90 Base) MCG/ACT inhaler Commonly known as:  PROVENTIL HFA;VENTOLIN HFA Inhale 2 puffs into the lungs every 6 (six) hours as needed for  wheezing or shortness of breath.   apixaban 2.5 MG Tabs tablet Commonly known as:  ELIQUIS Take 1 tablet (2.5 mg total) by mouth 2 (two) times daily.   beclomethasone 40 MCG/ACT inhaler Commonly known as:  QVAR Inhale 2 puffs into the lungs every 12 (twelve) hours as needed.   diclofenac sodium 1 % Gel Commonly known as:  VOLTAREN Apply 1 application topically 3 (three) times daily as needed.   fluticasone 50 MCG/ACT nasal spray Commonly known as:  FLONASE Place 2 sprays into the nose daily as needed for rhinitis.   HYDROcodone-acetaminophen 5-325 MG tablet Commonly known as:  NORCO/VICODIN Take 1-2 tablets by mouth every 4 (four) hours as needed for moderate pain.   methocarbamol 500 MG tablet Commonly known as:  ROBAXIN Take 1 tablet (500 mg total) by mouth 2 (two) times daily with a meal.   polyethylene glycol packet Commonly known as:  MIRALAX / GLYCOLAX Take 17 g by mouth 2 (two) times daily.   sennosides-docusate sodium 8.6-50 MG tablet Commonly known as:  SENOKOT-S Take 2 tablets by mouth 2 (two) times daily.   traMADol 50 MG tablet Commonly known as:  ULTRAM Take 50 mg by mouth every 12 (twelve) hours as needed.       Immunizations: Immunization History  Administered Date(s) Administered  . Influenza Split 01/10/2011  . Influenza,inj,Quad PF,36+ Mos 12/10/2012  . PPD Test 05/18/2015, 12/09/2015  . Pneumococcal Polysaccharide-23 01/10/2011     Physical Exam: Vitals:   12/13/15 1459  BP: 117/79  Pulse: 89  Resp: 18  Temp: 99.5 F (37.5 C)  TempSrc: Oral  SpO2: 99%  Weight: 227 lb (103 kg)  Height: 5\' 6"  (1.676 m)   Body mass index is 36.64 kg/m.  General- elderly female, obese, in no acute distress Head- normocephalic, atraumatic Nose- no nasal discharge Throat- moist mucus membrane Eyes- PERRLA, EOMI, no pallor, no icterus, no discharge, normal conjunctiva, normal sclera Neck- no cervical lymphadenopathy Cardiovascular- normal s1,s2, no  murmur, 1+ right leg edema Respiratory- bilateral clear to auscultation, no wheeze, no rhonchi, no crackles, no use of accessory muscles Abdomen- bowel sounds present, soft, non tender Musculoskeletal- able to move all 4 extremities, limited right knee ROM Neurological- alert and oriented to person, place and time Skin- warm and dry, right knee surgical incision with aquacel dressing with minimal drainage Psychiatry- normal mood and affect    Labs reviewed: Basic Metabolic Panel:  Recent Labs  09/81/1902/12/23 0702 05/20/15 11/28/15 1239 12/06/15 0552  NA 133* 135* 139 131*  K 4.0 3.5 3.5 4.4  CL 103  --  109 102  CO2 23  --  23 24  GLUCOSE 138*  --  86 130*  BUN 8 11 9 11   CREATININE 0.78 0.7 0.76 0.70  CALCIUM 8.3*  --  8.6* 8.1*   Liver Function Tests:  Recent Labs  05/20/15  AST 11*  ALT 8  ALKPHOS 39   No results for input(s): LIPASE, AMYLASE in the last 8760 hours. No results for input(s): AMMONIA  in the last 8760 hours. CBC:  Recent Labs  05/04/15 1525  05/20/15 11/28/15 1239 12/06/15 0552 12/07/15 0618 12/08/15 0726  WBC 4.8  < > 4.4 5.4 8.6 9.7 8.1  NEUTROABS 2.1  --  2 2.9  --   --   --   HGB 11.5*  < > 8.4* 11.8* 11.0* 11.7* 10.8*  HCT 37.3  < > 28* 38.0 34.5* 36.8 34.0*  MCV 89.0  < >  --  92.9 91.0 91.1 91.4  PLT 194  < > 211 201 165 167 152  < > = values in this interval not displayed.    Assessment/Plan  Unsteady gait Will have patient work with PT/OT as tolerated to regain strength and restore function.  Fall precautions are in place.  Right knee OA S/p right total knee arthroplasty. Will have her work with physical therapy and occupational therapy team to help with gait training and muscle strengthening exercises.fall precautions. Skin care. Encourage to be out of bed. Continue norco 5-325 mg 1-2 tab q4h prn pain and tramadol 50 mg q12h prn. Followed by PMR. Has orthopedic follow up. Continue robaxin 500 mg bid for muscle spasm. Add ted hose to  help with leg edema. Continue eliquis for dvt prophylaxis  Blood loss anemia Post op, monitor cbc  Hyponatremia Monitor bmp  Constipation On senokot s 2 tab bid and miralax bid, monitor  HTN Stable, off all meds, monitor  Chronic PE Breathing stable, continue eliquis and monitor   Goals of care: short term rehabilitation   Labs/tests ordered: cbc, bmp  Family/ staff Communication: reviewed care plan with patient and nursing supervisor    Oneal GroutMAHIMA Jack Mineau, MD Internal Medicine [facility] [address] [place], [place] [address] Cell Phone (Monday-Friday 8 am - 5 pm): [phone] On Call: [phone] and follow prompts after 5 pm and on weekends Office Phone: [phone] Office Fax: [phone]

## 2015-12-14 DIAGNOSIS — M6281 Muscle weakness (generalized): Secondary | ICD-10-CM | POA: Diagnosis not present

## 2015-12-14 DIAGNOSIS — R600 Localized edema: Secondary | ICD-10-CM | POA: Diagnosis not present

## 2015-12-14 DIAGNOSIS — M25561 Pain in right knee: Secondary | ICD-10-CM | POA: Diagnosis not present

## 2015-12-14 DIAGNOSIS — R2681 Unsteadiness on feet: Secondary | ICD-10-CM | POA: Diagnosis not present

## 2015-12-15 DIAGNOSIS — M6281 Muscle weakness (generalized): Secondary | ICD-10-CM | POA: Diagnosis not present

## 2015-12-15 DIAGNOSIS — M25561 Pain in right knee: Secondary | ICD-10-CM | POA: Diagnosis not present

## 2015-12-15 DIAGNOSIS — R600 Localized edema: Secondary | ICD-10-CM | POA: Diagnosis not present

## 2015-12-15 DIAGNOSIS — R2681 Unsteadiness on feet: Secondary | ICD-10-CM | POA: Diagnosis not present

## 2015-12-19 ENCOUNTER — Encounter: Payer: Self-pay | Admitting: Adult Health

## 2015-12-19 ENCOUNTER — Non-Acute Institutional Stay (SKILLED_NURSING_FACILITY): Payer: BLUE CROSS/BLUE SHIELD | Admitting: Adult Health

## 2015-12-19 DIAGNOSIS — D62 Acute posthemorrhagic anemia: Secondary | ICD-10-CM | POA: Diagnosis not present

## 2015-12-19 DIAGNOSIS — I10 Essential (primary) hypertension: Secondary | ICD-10-CM | POA: Diagnosis not present

## 2015-12-19 DIAGNOSIS — M1711 Unilateral primary osteoarthritis, right knee: Secondary | ICD-10-CM | POA: Diagnosis not present

## 2015-12-19 DIAGNOSIS — M6281 Muscle weakness (generalized): Secondary | ICD-10-CM | POA: Diagnosis not present

## 2015-12-19 DIAGNOSIS — R2681 Unsteadiness on feet: Secondary | ICD-10-CM | POA: Diagnosis not present

## 2015-12-19 DIAGNOSIS — M25561 Pain in right knee: Secondary | ICD-10-CM | POA: Diagnosis not present

## 2015-12-19 DIAGNOSIS — R600 Localized edema: Secondary | ICD-10-CM | POA: Diagnosis not present

## 2015-12-19 DIAGNOSIS — K5901 Slow transit constipation: Secondary | ICD-10-CM

## 2015-12-19 DIAGNOSIS — I2782 Chronic pulmonary embolism: Secondary | ICD-10-CM | POA: Diagnosis not present

## 2015-12-19 DIAGNOSIS — J309 Allergic rhinitis, unspecified: Secondary | ICD-10-CM | POA: Diagnosis not present

## 2015-12-19 NOTE — Progress Notes (Signed)
[name], female   DOB: [birth year], 55 y.o.   MRN: [id number]    DATE:    12/19/15  MRN:  [id number]  BIRTHDAY: [birth year]  Facility:  Nursing Home Location:  [facility]  Nursing Home Room Number: 703-P  LEVEL OF CARE:  SNF [id number](31)  Contact Information    Name Relation Home Work [employer]   Passe,Shawn Son [phone]  [phone]   Rush,Cora Mother [phone]  [phone]   Yahnke,Tasha Daughter [phone]  [phone]   No name specified           Code Status History    Date Active Date Inactive Code Status Order ID Comments User Context   12/05/2015  1:36 PM 12/08/2015  5:18 PM Full Code [id number]  Gean BirchwoodFrank Rowan, MD Inpatient   04/18/2013  1:09 AM 04/20/2013  4:23 PM Full Code [id number]  Therisa DoyneAnastassia Doutova, MD Inpatient   12/09/2012 11:54 AM 12/11/2012  2:40 PM Full Code [id number]  Karie SodaSteven Gross, MD Inpatient   10/20/2011  2:33 AM 10/20/2011  3:09 PM Full Code [id number]  Vida RollerBrian D Miller, MD ED   01/10/2011  2:04 AM 01/12/2011  7:28 PM Full Code [id number]  Hiram ComberJOLLY, TRAVIS E ED       Chief Complaint  Patient presents with  . Discharge Note    HISTORY OF PRESENT ILLNESS:  This is a 55 year old female who is for discharge home and will have outpatient care.  [facility] 12/05/15 to 12/07/15. She has PMH of urinary frequency, ventral hernia, sinus congestion and hypertension. She has  Primary osteoarthritis of right knee for which she had total knee arthroplasty on 12/05/15.  Patient was admitted to this facility for short-term rehabilitation after the patient's recent hospitalization.  Patient has completed SNF rehabilitation and therapy has cleared the patient for discharge.    PAST MEDICAL HISTORY:  Past Medical History:  Diagnosis Date  . Acute urinary retention 12/10/2012  . Arthritis   . History of anemia    "while pregnant"  . History of blood clots    . Hypertension    PREVIOUSLY TOOK MED - BP STABLE - TAKEN OFF MED  . Pneumonia 01/09/2011  . Sinus congestion   . Urinary frequency   . Ventral hernia   . Wears glasses      CURRENT MEDICATIONS: Reviewed  Patient's Medications  New Prescriptions   No medications on file  Previous Medications   ACETAMINOPHEN (TYLENOL) 500 MG TABLET    Take 500 mg by mouth every 6 (six) hours as needed.    ALBUTEROL (PROVENTIL HFA;VENTOLIN HFA) 108 (90 BASE) MCG/ACT INHALER    Inhale 2 puffs into the lungs every 6 (six) hours as needed for wheezing or shortness of breath.   APIXABAN (ELIQUIS) 2.5 MG TABS TABLET    Take 1 tablet (2.5 mg total) by mouth 2 (two) times daily.   BECLOMETHASONE (QVAR) 40 MCG/ACT INHALER    Inhale 2 puffs into the lungs every 12 (twelve) hours as needed.    DICLOFENAC SODIUM (VOLTAREN) 1 % GEL    Apply 1 application topically 3 (three) times daily as needed.   FLUTICASONE (FLONASE) 50 MCG/ACT NASAL SPRAY    Place 2 sprays into the nose daily as needed for rhinitis.   HYDROCODONE-ACETAMINOPHEN (NORCO/VICODIN) 5-325 MG TABLET    Take 1-2 tablets by mouth every 4 (four) hours as needed for moderate pain.   METHOCARBAMOL (ROBAXIN) 500  MG TABLET    Take 1 tablet (500 mg total) by mouth 2 (two) times daily with a meal.   POLYETHYLENE GLYCOL (MIRALAX / GLYCOLAX) PACKET    Take 17 g by mouth 2 (two) times daily.   SENNOSIDES-DOCUSATE SODIUM (SENOKOT-S) 8.6-50 MG TABLET    Take 2 tablets by mouth 2 (two) times daily.   TRAMADOL (ULTRAM) 50 MG TABLET    Take 50 mg by mouth every 12 (twelve) hours as needed.  Modified Medications   No medications on file  Discontinued Medications   No medications on file     Allergies  Allergen Reactions  . Oxycodone Itching and Rash     REVIEW OF SYSTEMS:  GENERAL: no change in appetite, no fatigue, no weight changes, no fever, chills or weakness EYES: Denies change in vision, dry eyes, eye pain, itching or discharge EARS: Denies change in  hearing, ringing in ears, or earache NOSE: Denies nasal congestion or epistaxis MOUTH and THROAT: Denies oral discomfort, gingival pain or bleeding, pain from teeth or hoarseness   RESPIRATORY: no cough, SOB, DOE, wheezing, hemoptysis CARDIAC: no chest pain,  or palpitations GI: no abdominal pain, diarrhea, heart burn, nausea or vomiting, +constipation GU: Denies dysuria, frequency, hematuria, incontinence, or discharge PSYCHIATRIC: Denies feeling of depression or anxiety. No report of hallucinations, insomnia, paranoia, or agitation    PHYSICAL EXAMINATION  GENERAL APPEARANCE: Well nourished. In no acute distress. Obese SKIN:  Right knee surgical incision is healed HEAD: Normal in size and contour. No evidence of trauma EYES: Lids open and close normally. No blepharitis, entropion or ectropion. PERRL. Conjunctivae are clear and sclerae are white. Lenses are without opacity EARS: Pinnae are normal. Patient hears normal voice tunes of the examiner MOUTH and THROAT: Lips are without lesions. Oral mucosa is moist and without lesions. Tongue is normal in shape, size, and color and without lesions NECK: supple, trachea midline, no neck masses, no thyroid tenderness, no thyromegaly LYMPHATICS: no LAN in the neck, no supraclavicular LAN RESPIRATORY: breathing is even & unlabored, BS CTAB CARDIAC: RRR, no murmur,no extra heart sounds, RLE 1+ edema GI: abdomen soft, normal BS, no masses, no tenderness, no hepatomegaly, no splenomegaly EXTREMITIES:  Able to move X 4 extremities PSYCHIATRIC: Alert and oriented X 3. Affect and behavior are appropriate  LABS/RADIOLOGY: Labs reviewed: Basic Metabolic Panel:  Recent Labs  98/12/9102/11/17 0702  11/28/15 1239 12/06/15 0552 12/12/15  NA 133*  < > 139 131* 136*  K 4.0  < > 3.5 4.4 4.4  CL 103  --  109 102  --   CO2 23  --  23 24  --   GLUCOSE 138*  --  86 130*  --   BUN 8  < > 9 11 2*  CREATININE 0.78  < > 0.76 0.70 0.6  CALCIUM 8.3*  --  8.6* 8.1*   --   < > = values in this interval not displayed. Liver Function Tests:  Recent Labs  05/20/15 12/12/15  AST 11* 32  ALT 8 46*  ALKPHOS 39 71   CBC:  Recent Labs  05/20/15 11/28/15 1239 12/06/15 0552 12/07/15 0618 12/08/15 0726 12/12/15  WBC 4.4 5.4 8.6 9.7 8.1 6.6  NEUTROABS 2 2.9  --   --   --  4  HGB 8.4* 11.8* 11.0* 11.7* 10.8* 9.5*  HCT 28* 38.0 34.5* 36.8 34.0* 30*  MCV  --  92.9 91.0 91.1 91.4  --   PLT 211 201 165 167 152 254  ASSESSMENT/PLAN:  Unsteady gait - for rehabilitation, PT and OT, for therapeutic strengthening exercises; fall precaution  OA of right knee S/P total knee arthroplasty - for outpatient rehabilitation; continue Ultram 50 mg 1 tab PO Q 12 hours PRN, Tylenol ES 500 mg 1 caplet PO Q 6 hours PRN and Norcon 5/325 mg 1-2 tabs PO Q 4 hours PRN for pain; Eliquis 2.5 mg 1 tab PO BID for DVT prophylaxis; Robaxin 500 mg 1 tab PO BID PRN and discontinue Robaxin BID for muscle spasm; follow-up with orthopedics   Allergic rhinitis - continue Flonase 50 mcg 2 sprays into nose Q D PRN  Constipation - continue Senna-S 8.6-50 mg 2 tabs PO BID and Miralax 17 gm PO BID  Anemia, acute blood loss - stable Lab Results  Component Value Date   HGB 9.5 (A) 12/12/2015    Hypertension - on no medications @ this time; stable  Chronic PE - no SOB; continue Eliquis 2.5 mg PO BID    I have filled out patient's discharge paperwork and written prescriptions.  Patient will have outpatient rehabilitation.  DME provided: None  Total discharge time: Less than 30 minutes  Discharge time involved coordination of the discharge process with social worker, nursing staff and therapy department.    Kenard Gower, NP [employer] [phone]

## 2015-12-20 DIAGNOSIS — M25561 Pain in right knee: Secondary | ICD-10-CM | POA: Diagnosis not present

## 2015-12-27 DIAGNOSIS — M25561 Pain in right knee: Secondary | ICD-10-CM | POA: Diagnosis not present

## 2015-12-27 DIAGNOSIS — M25661 Stiffness of right knee, not elsewhere classified: Secondary | ICD-10-CM | POA: Diagnosis not present

## 2015-12-27 DIAGNOSIS — Z96651 Presence of right artificial knee joint: Secondary | ICD-10-CM | POA: Diagnosis not present

## 2016-01-02 DIAGNOSIS — M25661 Stiffness of right knee, not elsewhere classified: Secondary | ICD-10-CM | POA: Diagnosis not present

## 2016-01-02 DIAGNOSIS — Z96651 Presence of right artificial knee joint: Secondary | ICD-10-CM | POA: Diagnosis not present

## 2016-01-02 DIAGNOSIS — M25561 Pain in right knee: Secondary | ICD-10-CM | POA: Diagnosis not present

## 2016-01-03 DIAGNOSIS — Z96651 Presence of right artificial knee joint: Secondary | ICD-10-CM | POA: Diagnosis not present

## 2016-01-03 DIAGNOSIS — M25561 Pain in right knee: Secondary | ICD-10-CM | POA: Diagnosis not present

## 2016-01-04 DIAGNOSIS — Z96651 Presence of right artificial knee joint: Secondary | ICD-10-CM | POA: Diagnosis not present

## 2016-01-04 DIAGNOSIS — M25661 Stiffness of right knee, not elsewhere classified: Secondary | ICD-10-CM | POA: Diagnosis not present

## 2016-01-04 DIAGNOSIS — M25561 Pain in right knee: Secondary | ICD-10-CM | POA: Diagnosis not present

## 2016-01-05 DIAGNOSIS — H18601 Keratoconus, unspecified, right eye: Secondary | ICD-10-CM | POA: Diagnosis not present

## 2016-01-09 DIAGNOSIS — M25561 Pain in right knee: Secondary | ICD-10-CM | POA: Diagnosis not present

## 2016-01-09 DIAGNOSIS — M25661 Stiffness of right knee, not elsewhere classified: Secondary | ICD-10-CM | POA: Diagnosis not present

## 2016-01-09 DIAGNOSIS — Z96651 Presence of right artificial knee joint: Secondary | ICD-10-CM | POA: Diagnosis not present

## 2016-01-11 DIAGNOSIS — M25561 Pain in right knee: Secondary | ICD-10-CM | POA: Diagnosis not present

## 2016-01-11 DIAGNOSIS — Z96651 Presence of right artificial knee joint: Secondary | ICD-10-CM | POA: Diagnosis not present

## 2016-01-11 DIAGNOSIS — M25661 Stiffness of right knee, not elsewhere classified: Secondary | ICD-10-CM | POA: Diagnosis not present

## 2016-01-16 DIAGNOSIS — M25561 Pain in right knee: Secondary | ICD-10-CM | POA: Diagnosis not present

## 2016-01-16 DIAGNOSIS — M25661 Stiffness of right knee, not elsewhere classified: Secondary | ICD-10-CM | POA: Diagnosis not present

## 2016-01-16 DIAGNOSIS — Z96651 Presence of right artificial knee joint: Secondary | ICD-10-CM | POA: Diagnosis not present

## 2016-01-17 DIAGNOSIS — R350 Frequency of micturition: Secondary | ICD-10-CM | POA: Diagnosis not present

## 2016-01-18 DIAGNOSIS — M25561 Pain in right knee: Secondary | ICD-10-CM | POA: Diagnosis not present

## 2016-01-18 DIAGNOSIS — Z96651 Presence of right artificial knee joint: Secondary | ICD-10-CM | POA: Diagnosis not present

## 2016-01-18 DIAGNOSIS — M25661 Stiffness of right knee, not elsewhere classified: Secondary | ICD-10-CM | POA: Diagnosis not present

## 2016-01-23 DIAGNOSIS — M25561 Pain in right knee: Secondary | ICD-10-CM | POA: Diagnosis not present

## 2016-01-23 DIAGNOSIS — Z96651 Presence of right artificial knee joint: Secondary | ICD-10-CM | POA: Diagnosis not present

## 2016-01-23 DIAGNOSIS — M25661 Stiffness of right knee, not elsewhere classified: Secondary | ICD-10-CM | POA: Diagnosis not present

## 2016-01-25 DIAGNOSIS — M25661 Stiffness of right knee, not elsewhere classified: Secondary | ICD-10-CM | POA: Diagnosis not present

## 2016-01-25 DIAGNOSIS — M25561 Pain in right knee: Secondary | ICD-10-CM | POA: Diagnosis not present

## 2016-01-25 DIAGNOSIS — Z96651 Presence of right artificial knee joint: Secondary | ICD-10-CM | POA: Diagnosis not present

## 2016-01-31 DIAGNOSIS — M25661 Stiffness of right knee, not elsewhere classified: Secondary | ICD-10-CM | POA: Diagnosis not present

## 2016-01-31 DIAGNOSIS — M25561 Pain in right knee: Secondary | ICD-10-CM | POA: Diagnosis not present

## 2016-01-31 DIAGNOSIS — Z96651 Presence of right artificial knee joint: Secondary | ICD-10-CM | POA: Diagnosis not present

## 2016-02-02 DIAGNOSIS — M25661 Stiffness of right knee, not elsewhere classified: Secondary | ICD-10-CM | POA: Diagnosis not present

## 2016-02-02 DIAGNOSIS — Z96651 Presence of right artificial knee joint: Secondary | ICD-10-CM | POA: Diagnosis not present

## 2016-02-02 DIAGNOSIS — M25561 Pain in right knee: Secondary | ICD-10-CM | POA: Diagnosis not present

## 2016-02-28 ENCOUNTER — Other Ambulatory Visit: Payer: Self-pay | Admitting: Adult Health

## 2016-03-07 DIAGNOSIS — Z96651 Presence of right artificial knee joint: Secondary | ICD-10-CM | POA: Diagnosis not present

## 2016-03-08 ENCOUNTER — Other Ambulatory Visit (HOSPITAL_COMMUNITY): Payer: Self-pay | Admitting: Orthopedic Surgery

## 2016-03-08 ENCOUNTER — Ambulatory Visit (HOSPITAL_COMMUNITY)
Admission: RE | Admit: 2016-03-08 | Discharge: 2016-03-08 | Disposition: A | Discharge status: Home / Self Care | Payer: BLUE CROSS/BLUE SHIELD | Source: Ambulatory Visit | Attending: Family Medicine | Admitting: Family Medicine

## 2016-03-08 DIAGNOSIS — M79661 Pain in right lower leg: Secondary | ICD-10-CM | POA: Diagnosis not present

## 2016-03-08 DIAGNOSIS — M7989 Other specified soft tissue disorders: Secondary | ICD-10-CM | POA: Diagnosis not present

## 2016-03-08 NOTE — Progress Notes (Signed)
*  PRELIMINARY RESULTS* Vascular Ultrasound Right lower extremity venous duplex has been completed.  Preliminary findings: no evidence of DVT. Superficial thrombosis noted in the right small saphenous vein.     Farrel DemarkJill Eunice, RDMS, RVT  03/08/2016, 11:51 AM

## 2016-03-14 DIAGNOSIS — Z96651 Presence of right artificial knee joint: Secondary | ICD-10-CM | POA: Diagnosis not present

## 2016-03-14 DIAGNOSIS — M25561 Pain in right knee: Secondary | ICD-10-CM | POA: Diagnosis not present

## 2016-04-02 ENCOUNTER — Other Ambulatory Visit: Payer: Self-pay | Admitting: Family Medicine

## 2016-04-02 ENCOUNTER — Other Ambulatory Visit (HOSPITAL_COMMUNITY)
Admission: RE | Admit: 2016-04-02 | Discharge: 2016-04-02 | Disposition: A | Discharge status: Home / Self Care | Payer: BLUE CROSS/BLUE SHIELD | Source: Ambulatory Visit | Attending: Family Medicine | Admitting: Family Medicine

## 2016-04-02 DIAGNOSIS — M150 Primary generalized (osteo)arthritis: Secondary | ICD-10-CM | POA: Diagnosis not present

## 2016-04-02 DIAGNOSIS — Z0001 Encounter for general adult medical examination with abnormal findings: Secondary | ICD-10-CM | POA: Diagnosis not present

## 2016-04-02 DIAGNOSIS — R079 Chest pain, unspecified: Secondary | ICD-10-CM | POA: Diagnosis not present

## 2016-04-02 DIAGNOSIS — I82401 Acute embolism and thrombosis of unspecified deep veins of right lower extremity: Secondary | ICD-10-CM | POA: Diagnosis not present

## 2016-04-02 DIAGNOSIS — Z124 Encounter for screening for malignant neoplasm of cervix: Secondary | ICD-10-CM | POA: Insufficient documentation

## 2016-04-02 DIAGNOSIS — R0600 Dyspnea, unspecified: Secondary | ICD-10-CM | POA: Diagnosis not present

## 2016-04-04 DIAGNOSIS — Z96651 Presence of right artificial knee joint: Secondary | ICD-10-CM | POA: Diagnosis not present

## 2016-04-04 DIAGNOSIS — Z96652 Presence of left artificial knee joint: Secondary | ICD-10-CM | POA: Diagnosis not present

## 2016-04-04 DIAGNOSIS — M25562 Pain in left knee: Secondary | ICD-10-CM | POA: Diagnosis not present

## 2016-04-04 DIAGNOSIS — M25561 Pain in right knee: Secondary | ICD-10-CM | POA: Diagnosis not present

## 2016-04-04 LAB — CYTOLOGY - PAP: Diagnosis: NEGATIVE

## 2016-05-01 DIAGNOSIS — M25561 Pain in right knee: Secondary | ICD-10-CM | POA: Diagnosis not present

## 2016-06-18 NOTE — Progress Notes (Signed)
This encounter was created in error - please disregard.

## 2016-07-03 DIAGNOSIS — M25561 Pain in right knee: Secondary | ICD-10-CM | POA: Diagnosis not present

## 2016-07-03 DIAGNOSIS — M25562 Pain in left knee: Secondary | ICD-10-CM | POA: Diagnosis not present

## 2016-07-31 ENCOUNTER — Other Ambulatory Visit: Payer: Self-pay | Admitting: Family Medicine

## 2016-07-31 DIAGNOSIS — Z1231 Encounter for screening mammogram for malignant neoplasm of breast: Secondary | ICD-10-CM

## 2016-08-24 ENCOUNTER — Ambulatory Visit
Admission: RE | Admit: 2016-08-24 | Discharge: 2016-08-24 | Disposition: A | Discharge status: Home / Self Care | Payer: BLUE CROSS/BLUE SHIELD | Source: Ambulatory Visit | Attending: Family Medicine | Admitting: Family Medicine

## 2016-08-24 DIAGNOSIS — Z1231 Encounter for screening mammogram for malignant neoplasm of breast: Secondary | ICD-10-CM | POA: Diagnosis not present

## 2016-08-24 DIAGNOSIS — R05 Cough: Secondary | ICD-10-CM | POA: Diagnosis not present

## 2016-08-24 DIAGNOSIS — R32 Unspecified urinary incontinence: Secondary | ICD-10-CM | POA: Diagnosis not present

## 2016-08-24 DIAGNOSIS — J069 Acute upper respiratory infection, unspecified: Secondary | ICD-10-CM | POA: Diagnosis not present

## 2016-08-27 DIAGNOSIS — R05 Cough: Secondary | ICD-10-CM | POA: Diagnosis not present

## 2016-08-27 DIAGNOSIS — R0602 Shortness of breath: Secondary | ICD-10-CM | POA: Diagnosis not present

## 2016-09-27 ENCOUNTER — Other Ambulatory Visit: Payer: Self-pay | Admitting: Adult Health

## 2016-10-01 ENCOUNTER — Other Ambulatory Visit: Payer: Self-pay | Admitting: Adult Health

## 2016-10-01 DIAGNOSIS — R350 Frequency of micturition: Secondary | ICD-10-CM | POA: Diagnosis not present

## 2016-10-01 DIAGNOSIS — I8290 Acute embolism and thrombosis of unspecified vein: Secondary | ICD-10-CM | POA: Diagnosis not present

## 2016-10-01 DIAGNOSIS — R8290 Unspecified abnormal findings in urine: Secondary | ICD-10-CM | POA: Diagnosis not present

## 2016-10-03 ENCOUNTER — Other Ambulatory Visit: Payer: Self-pay | Admitting: Adult Health

## 2016-10-12 ENCOUNTER — Other Ambulatory Visit: Payer: Self-pay | Admitting: Adult Health

## 2016-10-22 DIAGNOSIS — N3000 Acute cystitis without hematuria: Secondary | ICD-10-CM | POA: Diagnosis not present

## 2016-10-27 ENCOUNTER — Ambulatory Visit (INDEPENDENT_AMBULATORY_CARE_PROVIDER_SITE_OTHER): Payer: Worker's Compensation

## 2016-10-27 ENCOUNTER — Encounter (HOSPITAL_COMMUNITY): Payer: Self-pay | Admitting: Emergency Medicine

## 2016-10-27 ENCOUNTER — Ambulatory Visit (HOSPITAL_COMMUNITY)
Admission: EM | Admit: 2016-10-27 | Discharge: 2016-10-27 | Disposition: A | Discharge status: Home / Self Care | Payer: Worker's Compensation | Attending: Family Medicine | Admitting: Family Medicine

## 2016-10-27 DIAGNOSIS — W19XXXA Unspecified fall, initial encounter: Secondary | ICD-10-CM | POA: Diagnosis not present

## 2016-10-27 DIAGNOSIS — M79641 Pain in right hand: Secondary | ICD-10-CM | POA: Diagnosis not present

## 2016-10-27 DIAGNOSIS — S6991XA Unspecified injury of right wrist, hand and finger(s), initial encounter: Secondary | ICD-10-CM | POA: Diagnosis not present

## 2016-10-27 MED ORDER — NAPROXEN 375 MG PO TABS: 375.0000 mg | ORAL_TABLET | Freq: Two times a day (BID) | ORAL | 0 refills | Status: DC

## 2016-10-27 NOTE — ED Provider Notes (Signed)
[facility] CARE CENTER   [id number] 10/27/16 Arrival Time: 1200  ASSESSMENT & PLAN:  1. Hand injury, right, initial encounter     Meds ordered this encounter  Medications  . naproxen (NAPROSYN) 375 MG tablet    Sig: Take 1 tablet (375 mg total) by mouth 2 (two) times daily.    Dispense:  20 tablet    Refill:  0   No fracture seen on x-rays today. To f/u with [facility] on Sept 24th for further evaluation. Reviewed expectations re: course of current medical issues. Questions answered. Outlined signs and symptoms indicating need for more acute intervention. Patient verbalized understanding. After Visit Summary given.   SUBJECTIVE:  Brandi Rivas is a 56 y.o. female who presents with complaint of R hand pain after a fall at work approximately 3-4 days ago. Reports that she tripped and fell forward catching herself mainly with R hand. Gradual onset of R hand discomfort with swelling. Stable. No worsening. OTC ibuprofen  prn without much help. No sensation changes. No previous injury to R hand reported.  ROS: As per HPI.   OBJECTIVE:  Vitals:   10/27/16 1231  BP: 123/88  Pulse: 68  Resp: (!) 22  Temp: 98.1 F (36.7 C)  TempSrc: Oral  SpO2: 100%    General appearance: alert; no distress Abdomen: soft, non-tender Back: no CVA tenderness Extremities: bilateral healed scars of both anterior knees; generalized tenderness of R upper forearm, mostly in hand and fingers; not able to pinpoint specific area of tenderness but states mostly over hand; normal capillary refill; normal sensation Skin: warm and dry Psychological: alert and cooperative; normal mood and affect  Imaging: Dg Hand Complete Right  Result Date: 10/27/2016 CLINICAL DATA:  Recent fall with right hand pain, initial encounter EXAM: RIGHT HAND - COMPLETE 3+ VIEW COMPARISON:  None. FINDINGS: There is no evidence of fracture or dislocation. There is no evidence of arthropathy or other focal bone  abnormality. Soft tissues are unremarkable. IMPRESSION: No acute abnormality noted. Electronically Signed   By: Alcide Clever M.D.   On: 10/27/2016 13:15    Allergies  Allergen Reactions  . Oxycodone Itching and Rash    Past Medical History:  Diagnosis Date  . Acute urinary retention 12/10/2012  . Arthritis   . History of anemia    "while pregnant"  . History of blood clots   . Hypertension    PREVIOUSLY TOOK MED - BP STABLE - TAKEN OFF MED  . Pneumonia 01/09/2011  . Sinus congestion   . Urinary frequency   . Ventral hernia   . Wears glasses    Social History   Social History  . Marital status: Single    Spouse name: N/A  . Number of children: N/A  . Years of education: N/A   Occupational History  . CASHIER [employer]   Social History Main Topics  . Smoking status: Never Smoker  . Smokeless tobacco: Never Used  . Alcohol use No  . Drug use: No  . Sexual activity: Not on file   Other Topics Concern  . Not on file   Social History Narrative  . No narrative on file   Family History  Problem Relation Age of Onset  . Hypertension Mother   . Diabetes type II Mother   . Hypertension Father   . Colon cancer Father    Past Surgical History:  Procedure Laterality Date  . ARTHROSCOPY KNEE W/ DRILLING Bilateral   . COLONOSCOPY    .  ESOPHAGOGASTRODUODENOSCOPY    . INSERTION OF MESH N/A 12/09/2012   Procedure: INSERTION OF MESH;  Surgeon: Ardeth Sportsman, MD;  Location: [facility];  Service: General;  Laterality: N/A;  . TOTAL KNEE ARTHROPLASTY Left 05/16/2015   Procedure: TOTAL KNEE ARTHROPLASTY;  Surgeon: Gean Birchwood, MD;  Location: [facility] OR;  Service: Orthopedics;  Laterality: Left;  . TOTAL KNEE ARTHROPLASTY Right 12/05/2015   Procedure: TOTAL KNEE ARTHROPLASTY;  Surgeon: Gean Birchwood, MD;  Location: [facility] OR;  Service: Orthopedics;  Laterality: Right;  . VENTRAL HERNIA REPAIR N/A 12/09/2012   Procedure: LAPAROSCOPIC VENTRAL WALL HERNIA REPAIR;  Surgeon:  Ardeth Sportsman, MD;  Location: [facility];  Service: General;  Laterality: Vertis Kelch, MD 10/27/16 1328

## 2016-10-27 NOTE — ED Triage Notes (Addendum)
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  Patient has not been seen by a doctor for this.  Today right arm pain and both legs , and lower back  Are hurting.  History of blood clots.  Right arm is swollen compared to left arm and hand

## 2016-11-05 DIAGNOSIS — N390 Urinary tract infection, site not specified: Secondary | ICD-10-CM | POA: Diagnosis not present

## 2016-11-12 ENCOUNTER — Other Ambulatory Visit: Payer: Self-pay | Admitting: Adult Health

## 2017-01-01 DIAGNOSIS — Z96652 Presence of left artificial knee joint: Secondary | ICD-10-CM | POA: Diagnosis not present

## 2017-01-01 DIAGNOSIS — M25561 Pain in right knee: Secondary | ICD-10-CM | POA: Diagnosis not present

## 2017-01-01 DIAGNOSIS — M25562 Pain in left knee: Secondary | ICD-10-CM | POA: Diagnosis not present

## 2017-01-01 DIAGNOSIS — Z96651 Presence of right artificial knee joint: Secondary | ICD-10-CM | POA: Diagnosis not present

## 2017-02-09 ENCOUNTER — Ambulatory Visit (HOSPITAL_COMMUNITY)
Admission: EM | Admit: 2017-02-09 | Discharge: 2017-02-09 | Disposition: A | Discharge status: Home / Self Care | Payer: BLUE CROSS/BLUE SHIELD | Attending: Family Medicine | Admitting: Family Medicine

## 2017-02-09 ENCOUNTER — Ambulatory Visit (INDEPENDENT_AMBULATORY_CARE_PROVIDER_SITE_OTHER): Payer: BLUE CROSS/BLUE SHIELD

## 2017-02-09 ENCOUNTER — Encounter (HOSPITAL_COMMUNITY): Payer: Self-pay | Admitting: Emergency Medicine

## 2017-02-09 DIAGNOSIS — R079 Chest pain, unspecified: Secondary | ICD-10-CM | POA: Diagnosis not present

## 2017-02-09 DIAGNOSIS — R05 Cough: Secondary | ICD-10-CM

## 2017-02-09 DIAGNOSIS — J069 Acute upper respiratory infection, unspecified: Secondary | ICD-10-CM | POA: Diagnosis not present

## 2017-02-09 DIAGNOSIS — R059 Cough, unspecified: Secondary | ICD-10-CM

## 2017-02-09 MED ORDER — AZITHROMYCIN 250 MG PO TABS: 250.0000 mg | ORAL_TABLET | Freq: Every day | ORAL | 0 refills | Status: DC

## 2017-02-09 NOTE — ED Triage Notes (Signed)
PT C/O: cold sx associated w/prod cough, chest pain due to cough  Requesting a chest x-ray  ONSET: 10 days  DENIES: fevers, chills  TAKING MEDS: OTC cough meds   A&O x4... NAD... Ambulatory

## 2017-02-09 NOTE — ED Provider Notes (Signed)
[facility] CARE CENTER   [id number] 02/09/17 Arrival Time: 1246  ASSESSMENT & PLAN:  1. Cough   2. Upper respiratory tract infection, unspecified type     Meds ordered this encounter  Medications  . azithromycin (ZITHROMAX) 250 MG tablet    Sig: Take 1 tablet (250 mg total) by mouth daily. Take first 2 tablets together, then 1 every day until finished.    Dispense:  6 tablet    Refill:  0   No evidence of obvious PNA on CXR today. Given the duration of her symptoms I am treating with azithromycin. OTC symptom care as needed. Ensure adequate fluid intake and rest. Will f/u if not improving after antibiotic.  Reviewed expectations re: course of current medical issues. Questions answered. Outlined signs and symptoms indicating need for more acute intervention. Patient verbalized understanding. After Visit Summary given.   SUBJECTIVE: History from: patient.  Dasha D Creegan is a 57 y.o. female who presents with complaint of nasal congestion, post-nasal drainage, and a persistent dry cough. Onset abrupt, approximately 10 days ago. Overall fatigued with body aches. SOB: none. Wheezing: none. Fever: unsure; "feel chilled at night". Overall normal PO intake without n/v. Sick contacts: no. OTC treatment: cough med with some relief.  Received flu shot this year: no. Social History   Tobacco Use  Smoking Status Never Smoker  Smokeless Tobacco Never Used    ROS: As per HPI.   OBJECTIVE:  Vitals:   02/09/17 1349  BP: (!) 154/98  Pulse: 71  Resp: 18  Temp: 98 F (36.7 C)  TempSrc: Oral  SpO2: 100%     General appearance: alert; appears fatigued HEENT: nasal congestion; clear runny nose; throat irritation secondary to post-nasal drainage Neck: supple without LAD Lungs: clear to auscultation bilaterally; cough: mild; no respiratory distress Skin: warm and dry Psychological: alert and cooperative; normal mood and affect  Imaging: Dg Chest 2 View  Result Date:  02/09/2017 CLINICAL DATA:  Acute cough and chest pain for 2 days. EXAM: CHEST  2 VIEW COMPARISON:  05/04/2015 FINDINGS: Cardiomegaly again noted. Mild peribronchial thickening is unchanged. There is no evidence of focal airspace disease, pulmonary edema, suspicious pulmonary nodule/mass, pleural effusion, or pneumothorax. No acute bony abnormalities are identified. IMPRESSION: Cardiomegaly without evidence of acute cardiopulmonary disease. Mild chronic peribronchial thickening. Electronically Signed   By: Harmon Pier M.D.   On: 02/09/2017 14:19    Allergies  Allergen Reactions  . Oxycodone Itching and Rash    Past Medical History:  Diagnosis Date  . Acute urinary retention 12/10/2012  . Arthritis   . History of anemia    "while pregnant"  . History of blood clots   . Hypertension    PREVIOUSLY TOOK MED - BP STABLE - TAKEN OFF MED  . Pneumonia 01/09/2011  . Sinus congestion   . Urinary frequency   . Ventral hernia   . Wears glasses    Family History  Problem Relation Age of Onset  . Hypertension Mother   . Diabetes type II Mother   . Hypertension Father   . Colon cancer Father    Social History   Socioeconomic History  . Marital status: Single    Spouse name: Not on file  . Number of children: Not on file  . Years of education: Not on file  . Highest education level: Not on file  Social Needs  . Financial resource strain: Not on file  . Food insecurity - worry: Not on file  . Food insecurity -  inability: Not on file  . Transportation needs - medical: Not on file  . Transportation needs - non-medical: Not on file  Occupational History  . Occupation: LobbyistCASHIER    Employer: [employer]    Comment: [employer]  Tobacco Use  . Smoking status: Never Smoker  . Smokeless tobacco: Never Used  Substance and Sexual Activity  . Alcohol use: No  . Drug use: No  . Sexual activity: Not on file  Other Topics Concern  . Not on file  Social History Narrative  . Not on file            Mardella LaymanHagler, Dejan Angert, MD 02/09/17 1430

## 2017-02-18 DIAGNOSIS — Z23 Encounter for immunization: Secondary | ICD-10-CM | POA: Diagnosis not present

## 2017-02-18 DIAGNOSIS — R05 Cough: Secondary | ICD-10-CM | POA: Diagnosis not present

## 2017-03-21 DIAGNOSIS — M25561 Pain in right knee: Secondary | ICD-10-CM | POA: Diagnosis not present

## 2017-03-21 DIAGNOSIS — M25562 Pain in left knee: Secondary | ICD-10-CM | POA: Diagnosis not present

## 2017-03-26 ENCOUNTER — Other Ambulatory Visit (HOSPITAL_COMMUNITY): Payer: Self-pay | Admitting: Orthopedic Surgery

## 2017-03-26 DIAGNOSIS — M25561 Pain in right knee: Secondary | ICD-10-CM

## 2017-03-26 DIAGNOSIS — M25562 Pain in left knee: Principal | ICD-10-CM

## 2017-04-08 ENCOUNTER — Encounter (HOSPITAL_COMMUNITY)
Admission: RE | Admit: 2017-04-08 | Discharge: 2017-04-08 | Disposition: A | Discharge status: Home / Self Care | Payer: BLUE CROSS/BLUE SHIELD | Source: Ambulatory Visit | Attending: Orthopedic Surgery | Admitting: Orthopedic Surgery

## 2017-04-08 ENCOUNTER — Ambulatory Visit (HOSPITAL_COMMUNITY)
Admission: RE | Admit: 2017-04-08 | Discharge: 2017-04-08 | Disposition: A | Discharge status: Home / Self Care | Payer: BLUE CROSS/BLUE SHIELD | Source: Ambulatory Visit | Attending: Orthopedic Surgery | Admitting: Orthopedic Surgery

## 2017-04-08 DIAGNOSIS — M25562 Pain in left knee: Secondary | ICD-10-CM | POA: Insufficient documentation

## 2017-04-08 DIAGNOSIS — M25561 Pain in right knee: Secondary | ICD-10-CM | POA: Diagnosis not present

## 2017-04-08 DIAGNOSIS — R937 Abnormal findings on diagnostic imaging of other parts of musculoskeletal system: Secondary | ICD-10-CM | POA: Insufficient documentation

## 2017-04-08 MED ORDER — TECHNETIUM TC 99M MEDRONATE IV KIT
21.4000 | PACK | Freq: Once | INTRAVENOUS | Status: AC | PRN
  Administered 2017-04-08: 21.4 via INTRAVENOUS

## 2017-04-11 IMAGING — MG DIGITAL SCREENING BILATERAL MAMMOGRAM WITH CAD
4 series · 4 of 4 positions shown · non-contrast
Comparison: Previous exam(s).

CLINICAL DATA: Screening.

EXAM:
DIGITAL SCREENING BILATERAL MAMMOGRAM WITH CAD

[L CC]
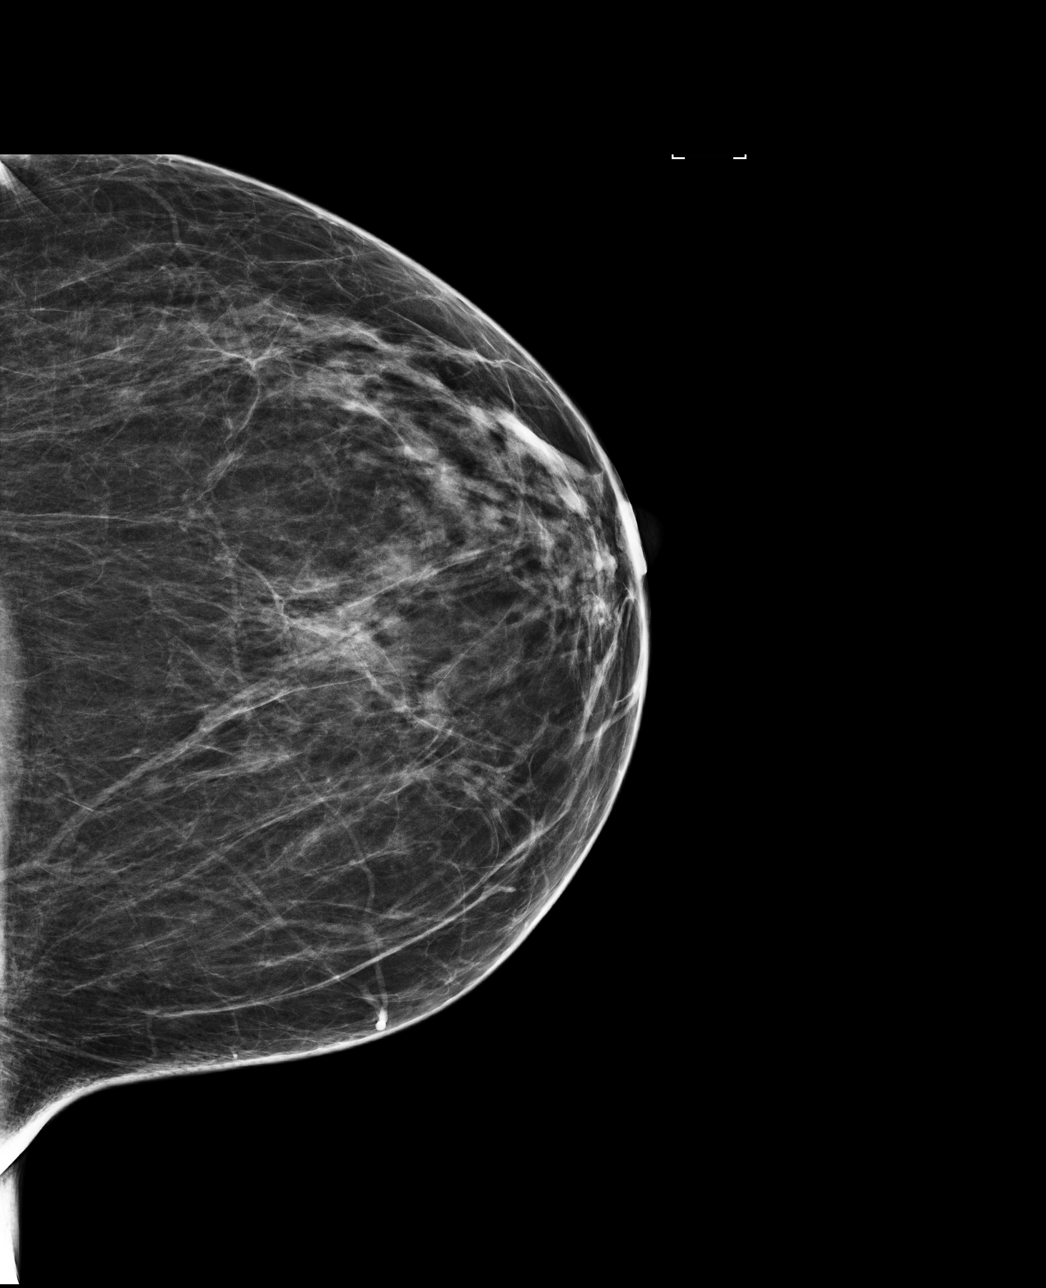

[L MLO]
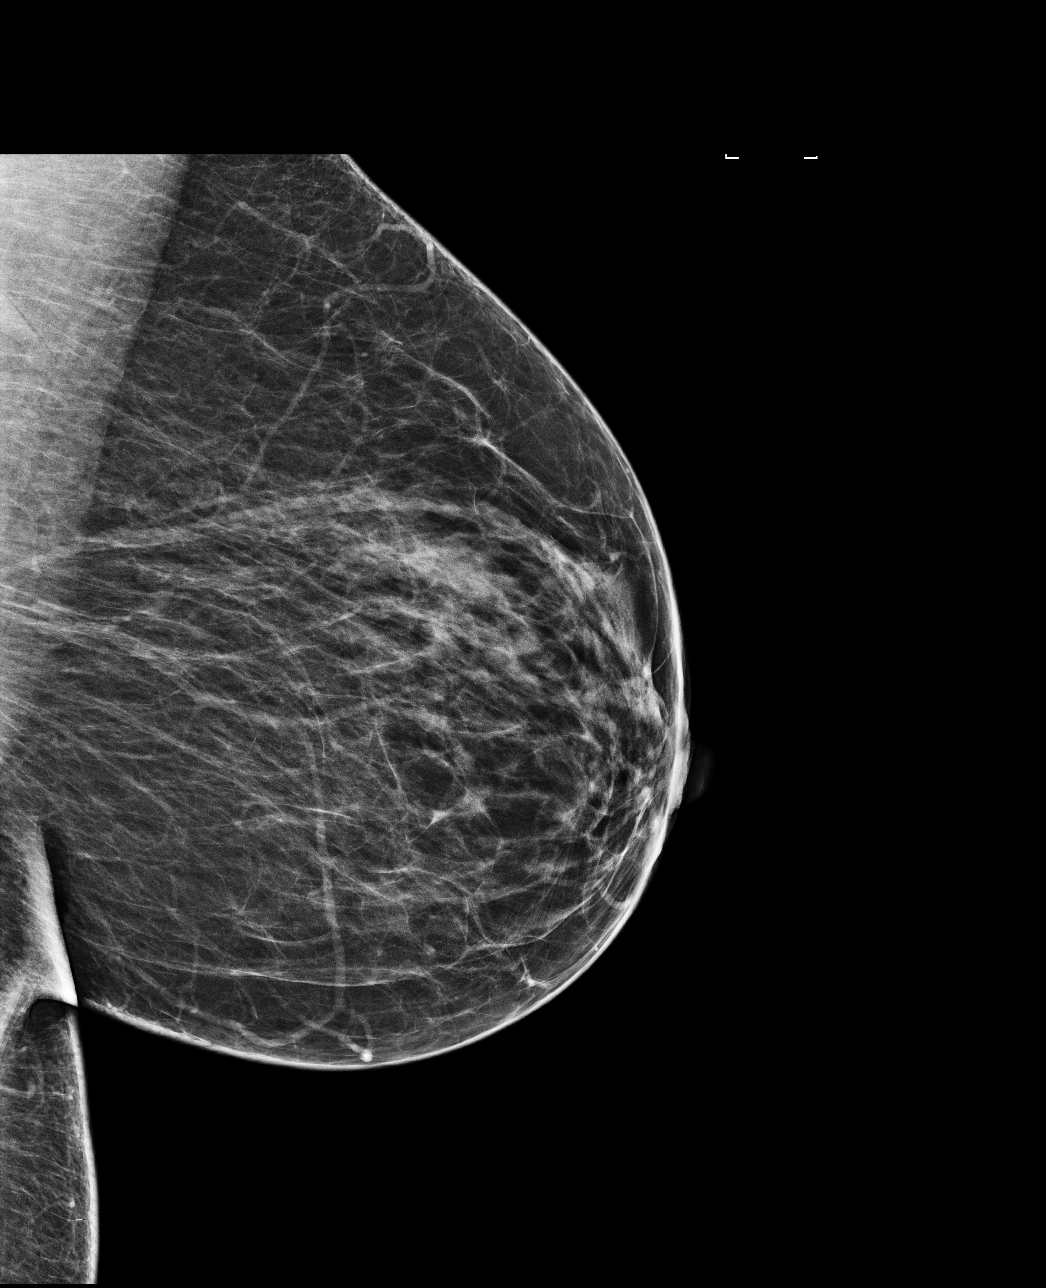

[R CC]
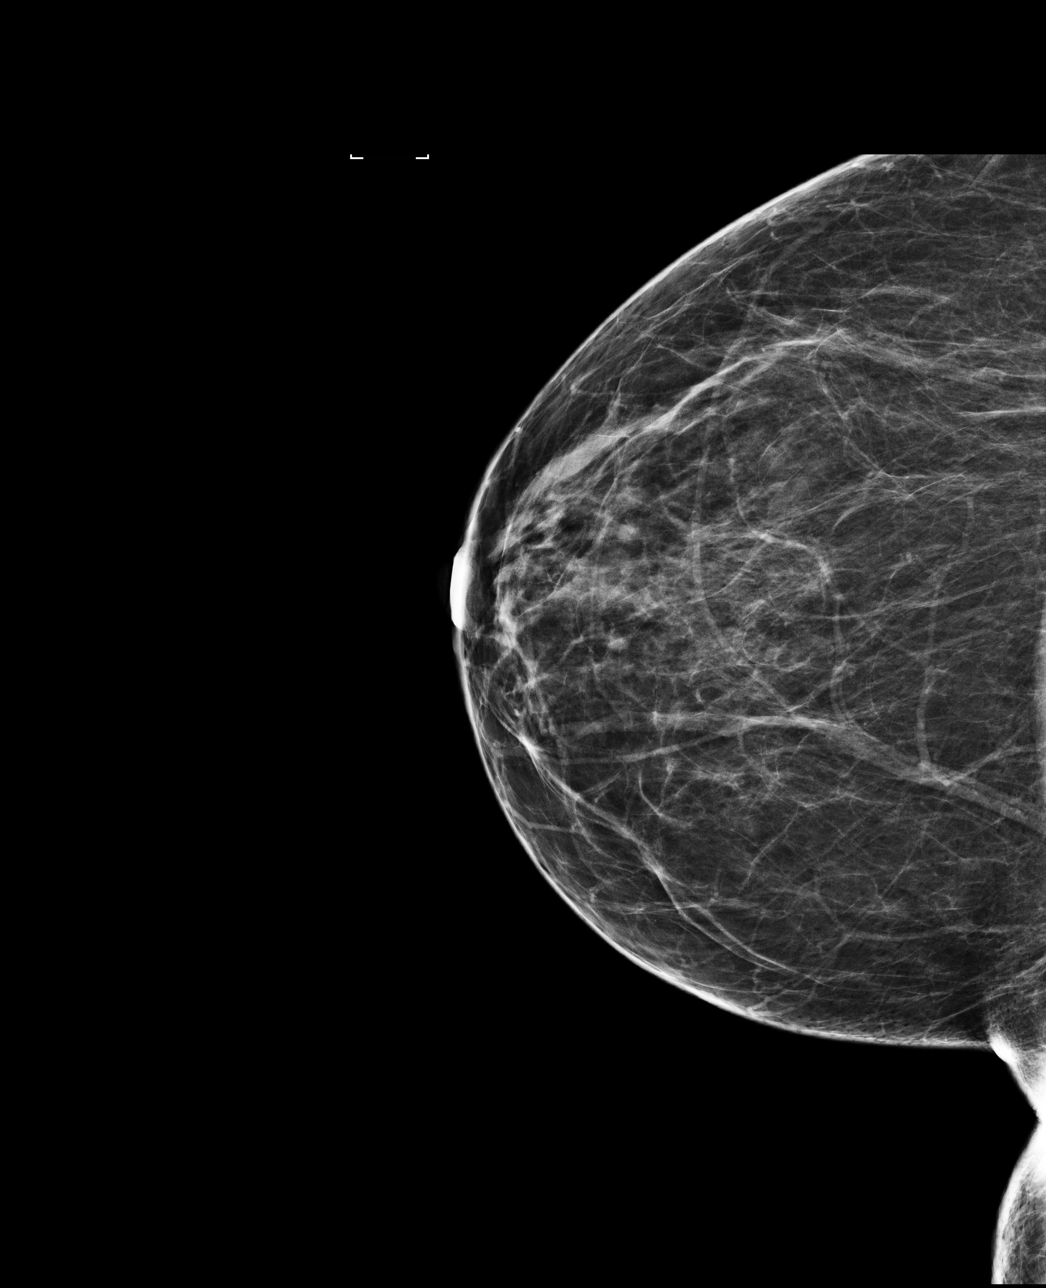

[R MLO]
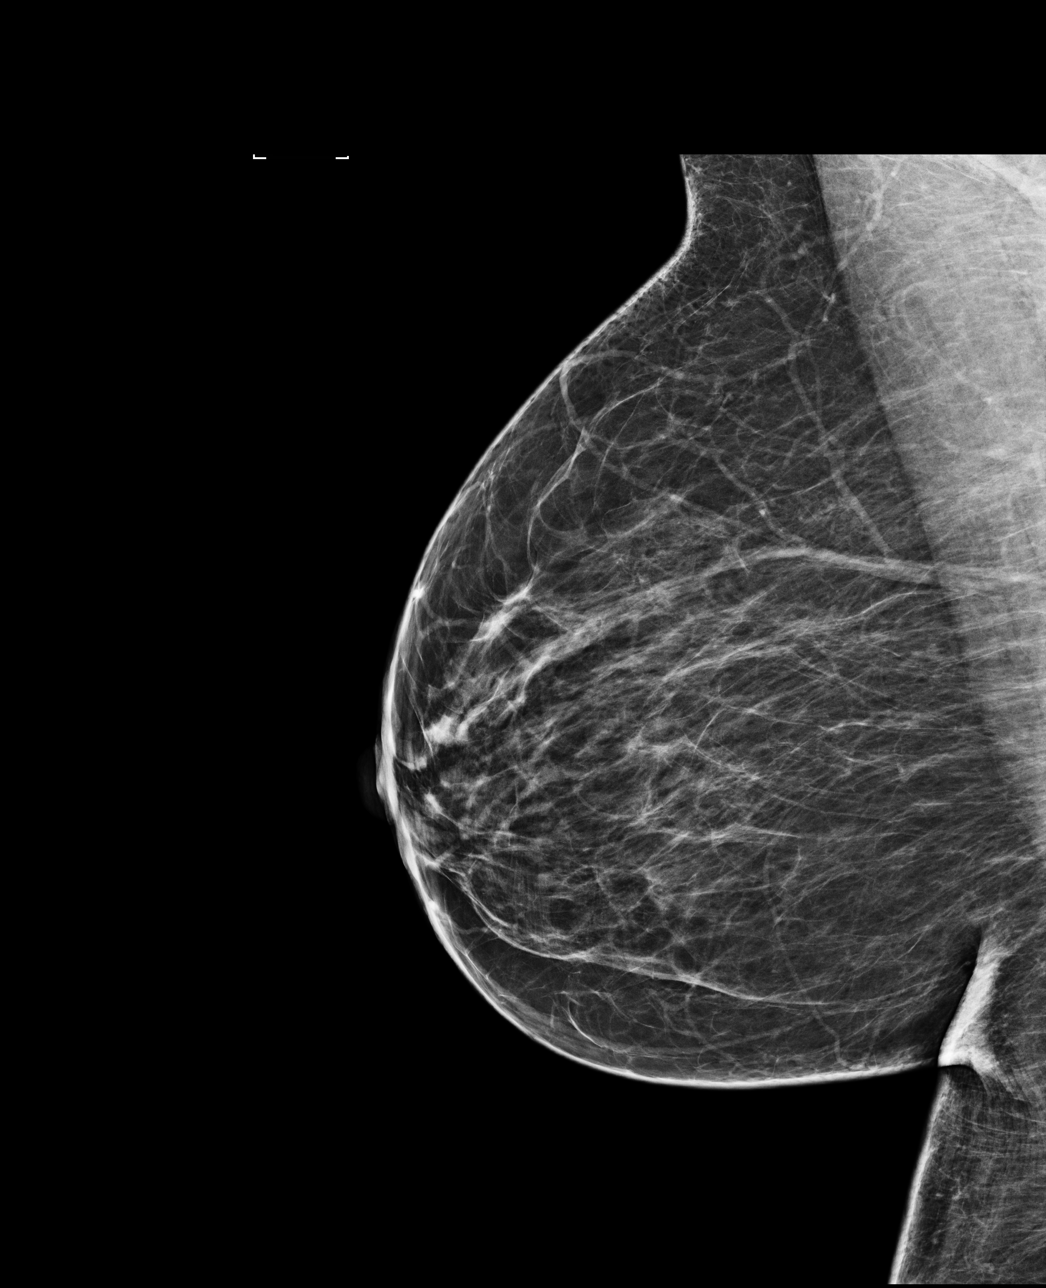

[4 of 4 positions shown; findings below may reference images not displayed]

ACR Breast Density Category b: There are scattered areas of
fibroglandular density.
FINDINGS: There are no findings suspicious for malignancy. Images were
processed with CAD.
IMPRESSION: No mammographic evidence of malignancy. A result letter of this
screening mammogram will be mailed directly to the patient.

RECOMMENDATION:
Screening mammogram in one year. (Code:AS-G-LCT)

BI-RADS CATEGORY  1: Negative.

## 2017-04-16 DIAGNOSIS — M25562 Pain in left knee: Secondary | ICD-10-CM | POA: Diagnosis not present

## 2017-04-16 DIAGNOSIS — M25561 Pain in right knee: Secondary | ICD-10-CM | POA: Diagnosis not present

## 2017-04-25 DIAGNOSIS — Z1159 Encounter for screening for other viral diseases: Secondary | ICD-10-CM | POA: Diagnosis not present

## 2017-04-25 DIAGNOSIS — Z0001 Encounter for general adult medical examination with abnormal findings: Secondary | ICD-10-CM | POA: Diagnosis not present

## 2017-04-25 DIAGNOSIS — E7800 Pure hypercholesterolemia, unspecified: Secondary | ICD-10-CM | POA: Diagnosis not present

## 2017-04-25 DIAGNOSIS — R609 Edema, unspecified: Secondary | ICD-10-CM | POA: Diagnosis not present

## 2017-05-06 ENCOUNTER — Emergency Department (HOSPITAL_COMMUNITY)
Admission: EM | Admit: 2017-05-06 | Discharge: 2017-05-06 | Disposition: A | Discharge status: Home / Self Care | Payer: BLUE CROSS/BLUE SHIELD | Attending: Emergency Medicine | Admitting: Emergency Medicine

## 2017-05-06 ENCOUNTER — Other Ambulatory Visit: Payer: Self-pay

## 2017-05-06 ENCOUNTER — Encounter (HOSPITAL_COMMUNITY): Payer: Self-pay | Admitting: Emergency Medicine

## 2017-05-06 DIAGNOSIS — Z96653 Presence of artificial knee joint, bilateral: Secondary | ICD-10-CM | POA: Insufficient documentation

## 2017-05-06 DIAGNOSIS — Z79899 Other long term (current) drug therapy: Secondary | ICD-10-CM | POA: Diagnosis not present

## 2017-05-06 DIAGNOSIS — M7989 Other specified soft tissue disorders: Secondary | ICD-10-CM | POA: Insufficient documentation

## 2017-05-06 DIAGNOSIS — R2241 Localized swelling, mass and lump, right lower limb: Secondary | ICD-10-CM | POA: Diagnosis not present

## 2017-05-06 DIAGNOSIS — I10 Essential (primary) hypertension: Secondary | ICD-10-CM | POA: Diagnosis not present

## 2017-05-06 MED ORDER — ENOXAPARIN SODIUM 120 MG/0.8ML ~~LOC~~ SOLN
110.0000 mg | Freq: Once | SUBCUTANEOUS | Status: AC
  Administered 2017-05-06: 110 mg via SUBCUTANEOUS
  Filled 2017-05-06: qty 0.8

## 2017-05-06 NOTE — ED Notes (Signed)
ED Provider at bedside. 

## 2017-05-06 NOTE — ED Triage Notes (Signed)
Pt c/o RLE swelling and "heaviness" with hx of DVT's.  Pt states edema has been present since knee replacement in October.  Pt states she has been off blood thinners x 2 years. Pt states she called surgeon today and was told to come to ED.

## 2017-05-06 NOTE — ED Notes (Signed)
Patient verbalizes understanding of discharge instructions. Opportunity for questioning and answers were provided. Armband removed by staff, pt discharged from ED. E signature not working in room at this time.

## 2017-05-06 NOTE — ED Notes (Signed)
Pt notified to be at admitting office for vascular study tomorrow at 8am

## 2017-05-06 NOTE — Discharge Instructions (Addendum)
Elevate the right leg above your heart as much as possible.  Use heat on the right lower leg 3 or 4 times a day.  Return here as recommended for the ultrasound to be evaluated for a blood clot as a source of your discomfort.

## 2017-05-06 NOTE — ED Provider Notes (Signed)
[facility] EMERGENCY DEPARTMENT Provider Note   CSN: [id number] Arrival date & time: 05/06/17  1857     History   Chief Complaint Chief Complaint  Patient presents with  . Leg Swelling    HPI Brandi Rivas is a 57 y.o. female.  She complains of tenderness and swelling right lower leg, for 1 month.  The symptoms are worsening.  No recent trauma.  Prior right knee surgery.  She denies shortness of breath, fever, chills, nausea, vomiting, weakness or dizziness.  She is able to ambulate.  There are no other known modifying factors  HPI  Past Medical History:  Diagnosis Date  . Acute urinary retention 12/10/2012  . Arthritis   . History of anemia    "while pregnant"  . History of blood clots   . Hypertension    PREVIOUSLY TOOK MED - BP STABLE - TAKEN OFF MED  . Pneumonia 01/09/2011  . Sinus congestion   . Urinary frequency   . Ventral hernia   . Wears glasses     Patient Active Problem List   Diagnosis Date Noted  . Primary osteoarthritis of right knee 12/02/2015  . Primary osteoarthritis of left knee 05/14/2015  . Pulmonary emboli (HCC) 04/18/2013  . Pulmonary embolus (HCC) 04/17/2013  . Constipation, chronic 12/29/2012  . Hypertension   . Ventral hernia - periumbilical - s/p lap repair 12/09/2012 11/03/2012  . Obesity (BMI 30-39.9) 11/03/2012    Past Surgical History:  Procedure Laterality Date  . ARTHROSCOPY KNEE W/ DRILLING Bilateral   . COLONOSCOPY    . ESOPHAGOGASTRODUODENOSCOPY    . INSERTION OF MESH N/A 12/09/2012   Procedure: INSERTION OF MESH;  Surgeon: Ardeth Sportsman, MD;  Location: [facility];  Service: General;  Laterality: N/A;  . TOTAL KNEE ARTHROPLASTY Left 05/16/2015   Procedure: TOTAL KNEE ARTHROPLASTY;  Surgeon: Gean Birchwood, MD;  Location: [facility] OR;  Service: Orthopedics;  Laterality: Left;  . TOTAL KNEE ARTHROPLASTY Right 12/05/2015   Procedure: TOTAL KNEE ARTHROPLASTY;  Surgeon: Gean Birchwood, MD;  Location: [facility] OR;  Service:  Orthopedics;  Laterality: Right;  . VENTRAL HERNIA REPAIR N/A 12/09/2012   Procedure: LAPAROSCOPIC VENTRAL WALL HERNIA REPAIR;  Surgeon: Ardeth Sportsman, MD;  Location: [facility];  Service: General;  Laterality: N/A;     OB History   None      Home Medications    Prior to Admission medications   Medication Sig Start Date End Date Taking? Authorizing Provider  acetaminophen (TYLENOL) 500 MG tablet Take 1,000 mg by mouth every 6 (six) hours as needed for headache (pain).    Yes [provider]  albuterol (PROVENTIL HFA;VENTOLIN HFA) 108 (90 Base) MCG/ACT inhaler Inhale 2 puffs into the lungs every 6 (six) hours as needed for wheezing or shortness of breath.   Yes [provider]  diclofenac sodium (VOLTAREN) 1 % GEL Apply 1 application topically 3 (three) times daily as needed (pain).    Yes [provider]  fluticasone (FLONASE) 50 MCG/ACT nasal spray Place 2 sprays into both nostrils daily as needed for allergies or rhinitis.    Yes [provider]  fluticasone furoate-vilanterol (BREO ELLIPTA) 100-25 MCG/INH AEPB Inhale 1 puff into the lungs daily.   Yes [provider]  ibuprofen (ADVIL,MOTRIN) 200 MG tablet Take 200 mg by mouth every 6 (six) hours as needed for headache (pain).    Yes [provider]  ibuprofen (ADVIL,MOTRIN) 800 MG tablet Take 800 mg by mouth every 8 (  eight) hours as needed for headache (pain).   Yes [provider]  methocarbamol (ROBAXIN) 500 MG tablet Take 1 tablet (500 mg total) by mouth 2 (two) times daily with a meal. Patient not taking: Reported on 05/06/2017 12/05/15   Allena KatzPhillips, Eric K, PA-C  naproxen (NAPROSYN) 375 MG tablet Take 1 tablet (375 mg total) by mouth 2 (two) times daily. Patient not taking: Reported on 05/06/2017 10/27/16   Mardella LaymanHagler, Brian, MD    Family History Family History  Problem Relation Age of Onset  . Hypertension Mother   . Diabetes type II Mother   . Hypertension Father   . Colon  cancer Father     Social History Social History   Tobacco Use  . Smoking status: Never Smoker  . Smokeless tobacco: Never Used  Substance Use Topics  . Alcohol use: No  . Drug use: No     Allergies   Oxycodone   Review of Systems Review of Systems  All other systems reviewed and are negative.    Physical Exam Updated Vital Signs BP (!) 131/93   Pulse 69   Temp 98.2 F (36.8 C) (Oral)   Resp 16   Ht 5\' 6"  (1.676 m)   Wt 108.4 kg (239 lb)   LMP 04/24/2017   SpO2 98%   BMI 38.58 kg/m   Physical Exam  Constitutional: She is oriented to person, place, and time. She appears well-developed and well-nourished. No distress.  HENT:  Head: Normocephalic and atraumatic.  Eyes: Pupils are equal, round, and reactive to light. Conjunctivae and EOM are normal.  Neck: Normal range of motion and phonation normal. Neck supple.  Cardiovascular: Normal rate.  Pulmonary/Chest: Effort normal.  Musculoskeletal:  Right lower leg mild tenderness with swelling, no popliteal cord or significant tenderness.  Neurovascular intact distally in both feet.  Neurological: She is alert and oriented to person, place, and time. She exhibits normal muscle tone.  Skin: Skin is warm and dry.  Psychiatric: She has a normal mood and affect. Her behavior is normal. Judgment and thought content normal.  Nursing note and vitals reviewed.    ED Treatments / Results  Labs (all labs ordered are listed, but only abnormal results are displayed) Labs Reviewed - No data to display  EKG None  Radiology No results found.  Procedures Procedures (including critical care time)  Medications Ordered in ED Medications  enoxaparin (LOVENOX) injection 110 mg (110 mg Subcutaneous Given 05/06/17 2250)     Initial Impression / Assessment and Plan / ED Course  I have reviewed the triage vital signs and the nursing notes.  Pertinent labs & imaging results that were available during my care of the patient  were reviewed by me and considered in my medical decision making (see chart for details).      No data found.  At D/C Reevaluation with update and discussion. After initial assessment and treatment, an updated evaluation reveals no further c/o. Findings discussed and all questions answered. Mancel BaleElliott Kaydenn Mclear   MDM- Right leg swelling with hx DVT. Doubt PE or cellulitis.  Nursing Notes Reviewed/ Care Coordinated Applicable Imaging Reviewed Interpretation of Laboratory Data incorporated into ED treatment  The patient appears reasonably screened and/or stabilized for discharge and I doubt any other medical condition or other [facility] requiring further screening, evaluation, or treatment in the ED at this time prior to discharge.  Plan: Home Medications- usual; Home Treatments- rest, fluids; return here if the recommended treatment, does not improve the symptoms; Recommended  follow up- DVT study in AM,  PCP prn    Final Clinical Impressions(s) / ED Diagnoses   Final diagnoses:  Leg swelling    ED Discharge Orders        Ordered    VAS [place] LOWER EXTREMITY VENOUS (DVT)  Status:  Canceled     05/06/17 2306    LE VENOUS  Status:  Canceled     05/06/17 2309    LE VENOUS     05/06/17 2311       Mancel Bale, MD 05/08/17 2044

## 2017-05-07 ENCOUNTER — Ambulatory Visit (HOSPITAL_COMMUNITY)
Admission: RE | Admit: 2017-05-07 | Discharge: 2017-05-07 | Disposition: A | Discharge status: Home / Self Care | Payer: BLUE CROSS/BLUE SHIELD | Source: Ambulatory Visit | Attending: Emergency Medicine | Admitting: Emergency Medicine

## 2017-05-07 ENCOUNTER — Encounter (HOSPITAL_COMMUNITY): Payer: Self-pay | Admitting: Emergency Medicine

## 2017-05-07 DIAGNOSIS — M7989 Other specified soft tissue disorders: Secondary | ICD-10-CM | POA: Insufficient documentation

## 2017-05-07 NOTE — Progress Notes (Signed)
*  Preliminary Results* Right lower extremity venous duplex completed. Right lower extremity is negative for deep vein thrombosis. There is no evidence of right Baker's cyst.  05/07/2017 9:06 AM  Gertie FeyMichelle Grady Lucci, BS, RVT, RDCS, RDMS

## 2017-05-08 DIAGNOSIS — H18601 Keratoconus, unspecified, right eye: Secondary | ICD-10-CM | POA: Diagnosis not present

## 2017-05-09 DIAGNOSIS — M7989 Other specified soft tissue disorders: Secondary | ICD-10-CM | POA: Diagnosis not present

## 2017-06-11 DIAGNOSIS — Z09 Encounter for follow-up examination after completed treatment for conditions other than malignant neoplasm: Secondary | ICD-10-CM | POA: Diagnosis not present

## 2017-06-11 DIAGNOSIS — Z96651 Presence of right artificial knee joint: Secondary | ICD-10-CM | POA: Diagnosis not present

## 2017-06-11 DIAGNOSIS — M25562 Pain in left knee: Secondary | ICD-10-CM | POA: Diagnosis not present

## 2017-06-11 DIAGNOSIS — M25561 Pain in right knee: Secondary | ICD-10-CM | POA: Diagnosis not present

## 2017-06-11 DIAGNOSIS — Z96652 Presence of left artificial knee joint: Secondary | ICD-10-CM | POA: Diagnosis not present

## 2017-07-25 ENCOUNTER — Other Ambulatory Visit: Payer: Self-pay | Admitting: Family Medicine

## 2017-07-25 DIAGNOSIS — Z1231 Encounter for screening mammogram for malignant neoplasm of breast: Secondary | ICD-10-CM

## 2017-08-26 ENCOUNTER — Ambulatory Visit: Payer: Self-pay

## 2017-09-10 DIAGNOSIS — M25561 Pain in right knee: Secondary | ICD-10-CM | POA: Diagnosis not present

## 2017-09-10 DIAGNOSIS — Z96652 Presence of left artificial knee joint: Secondary | ICD-10-CM | POA: Diagnosis not present

## 2017-09-10 DIAGNOSIS — M25562 Pain in left knee: Secondary | ICD-10-CM | POA: Diagnosis not present

## 2017-09-10 DIAGNOSIS — Z96651 Presence of right artificial knee joint: Secondary | ICD-10-CM | POA: Diagnosis not present

## 2017-09-16 ENCOUNTER — Ambulatory Visit
Admission: RE | Admit: 2017-09-16 | Discharge: 2017-09-16 | Disposition: A | Discharge status: Home / Self Care | Payer: BLUE CROSS/BLUE SHIELD | Source: Ambulatory Visit | Attending: Family Medicine | Admitting: Family Medicine

## 2017-09-16 DIAGNOSIS — Z1231 Encounter for screening mammogram for malignant neoplasm of breast: Secondary | ICD-10-CM

## 2017-12-21 DIAGNOSIS — M25561 Pain in right knee: Secondary | ICD-10-CM | POA: Diagnosis not present

## 2017-12-21 DIAGNOSIS — Z96652 Presence of left artificial knee joint: Secondary | ICD-10-CM | POA: Diagnosis not present

## 2017-12-21 DIAGNOSIS — Z09 Encounter for follow-up examination after completed treatment for conditions other than malignant neoplasm: Secondary | ICD-10-CM | POA: Diagnosis not present

## 2017-12-21 DIAGNOSIS — M25562 Pain in left knee: Secondary | ICD-10-CM | POA: Diagnosis not present

## 2017-12-21 DIAGNOSIS — Z96651 Presence of right artificial knee joint: Secondary | ICD-10-CM | POA: Diagnosis not present

## 2018-01-08 DIAGNOSIS — B349 Viral infection, unspecified: Secondary | ICD-10-CM | POA: Diagnosis not present

## 2018-01-08 DIAGNOSIS — R0981 Nasal congestion: Secondary | ICD-10-CM | POA: Diagnosis not present

## 2018-01-08 DIAGNOSIS — R05 Cough: Secondary | ICD-10-CM | POA: Diagnosis not present

## 2018-03-05 DIAGNOSIS — N3000 Acute cystitis without hematuria: Secondary | ICD-10-CM | POA: Diagnosis not present

## 2018-05-02 DIAGNOSIS — E668 Other obesity: Secondary | ICD-10-CM | POA: Diagnosis not present

## 2018-05-02 DIAGNOSIS — Z0000 Encounter for general adult medical examination without abnormal findings: Secondary | ICD-10-CM | POA: Diagnosis not present

## 2018-05-02 DIAGNOSIS — R7309 Other abnormal glucose: Secondary | ICD-10-CM | POA: Diagnosis not present

## 2018-05-02 DIAGNOSIS — Z1331 Encounter for screening for depression: Secondary | ICD-10-CM | POA: Diagnosis not present

## 2018-05-02 DIAGNOSIS — M1990 Unspecified osteoarthritis, unspecified site: Secondary | ICD-10-CM | POA: Diagnosis not present

## 2018-05-02 DIAGNOSIS — E048 Other specified nontoxic goiter: Secondary | ICD-10-CM | POA: Diagnosis not present

## 2018-05-02 DIAGNOSIS — I10 Essential (primary) hypertension: Secondary | ICD-10-CM | POA: Diagnosis not present

## 2018-07-14 ENCOUNTER — Other Ambulatory Visit: Payer: Self-pay

## 2018-07-14 ENCOUNTER — Ambulatory Visit (HOSPITAL_COMMUNITY)
Admission: EM | Admit: 2018-07-14 | Discharge: 2018-07-14 | Disposition: A | Discharge status: Home / Self Care | Payer: BLUE CROSS/BLUE SHIELD | Attending: Family Medicine | Admitting: Family Medicine

## 2018-07-14 ENCOUNTER — Encounter (HOSPITAL_COMMUNITY): Payer: Self-pay

## 2018-07-14 DIAGNOSIS — M25512 Pain in left shoulder: Secondary | ICD-10-CM

## 2018-07-14 DIAGNOSIS — R002 Palpitations: Secondary | ICD-10-CM | POA: Diagnosis not present

## 2018-07-14 DIAGNOSIS — N3090 Cystitis, unspecified without hematuria: Secondary | ICD-10-CM

## 2018-07-14 LAB — POCT URINALYSIS DIP (DEVICE)
Bilirubin Urine: NEGATIVE
Glucose, UA: NEGATIVE mg/dL
Ketones, ur: NEGATIVE mg/dL
Nitrite: NEGATIVE
Protein, ur: NEGATIVE mg/dL
Specific Gravity, Urine: >=1.03 (ref 1.005–1.030)
Urobilinogen, UA: 0.2 mg/dL (ref 0.0–1.0)
pH: 5.5 (ref 5.0–8.0)

## 2018-07-14 MED ORDER — NAPROXEN 500 MG PO TABS: 500.0000 mg | ORAL_TABLET | Freq: Two times a day (BID) | ORAL | 0 refills | Status: AC

## 2018-07-14 MED ORDER — NITROFURANTOIN MONOHYD MACRO 100 MG PO CAPS: 100.0000 mg | ORAL_CAPSULE | Freq: Two times a day (BID) | ORAL | 0 refills | Status: AC

## 2018-07-14 MED ORDER — PHENAZOPYRIDINE HCL 97.2 MG PO TABS: 97.0000 mg | ORAL_TABLET | Freq: Three times a day (TID) | ORAL | 0 refills | Status: AC | PRN

## 2018-07-14 NOTE — ED Provider Notes (Signed)
[place]    CSN: [id number] Arrival date & time: 07/14/18  1517     History   Chief Complaint Chief Complaint  Patient presents with  . Appointment    1530  . Dysuria    HPI Brandi Rivas is a 58 y.o. female.   HPI  Patient has a couple of complaints.  She usually has a PCP, however, her insurance changed at work and she had to change providers.  Because of COVID-19 she is having difficulty establishing with a new doctor.  She has made an appointment but it is in the future.  When she called to tell them she was having problems, they recommended urgent care. First problem: Dysuria and frequency.  She thinks it is a bladder infection.  She has a lot of pain at the end of urination.  She is had bladder infections before.  No abdominal pain.  No flank pain.  No hematuria.  No fever or chills.  No nausea or vomiting.  No vaginal discharge.  Patient is postmenopausal, no vaginal bleeding Second problem is left shoulder pain.  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  She started noticing left shoulder pain while at work.  No accident or injury.  She points to her left deltoid region.  No numbness or weakness. Third problem is palpitations.  She states for the last week she has had a few spells of rapid heartbeat.  She states that last anything from a few seconds up to 20 minutes.  It makes her feel uncomfortable and panicky.  No chest pain, no shortness of breath, no lightheadedness.  She does not take any nutritional supplements.  No over-the-counter medicines.  No Sudafed or stimulants.  She has a very moderate caffeine intake, 1 or 2 beverages a day.  She denies stress. She denies hypertension, diabetes, hyperlipidemia, or heart disease.  She does have a family history of all of the above.  Past Medical History:  Diagnosis Date  . Acute urinary retention 12/10/2012  . Arthritis   . History of anemia    "while  pregnant"  . History of blood clots   . Hypertension    PREVIOUSLY TOOK MED - BP STABLE - TAKEN OFF MED  . Pneumonia 01/09/2011  . Sinus congestion   . Urinary frequency   . Ventral hernia   . Wears glasses     Patient Active Problem List   Diagnosis Date Noted  . Primary osteoarthritis of right knee 12/02/2015  . Primary osteoarthritis of left knee 05/14/2015  . Pulmonary emboli ([place]) 04/18/2013  . Pulmonary embolus ([place]) 04/17/2013  . Constipation, chronic 12/29/2012  . Hypertension   . Ventral hernia - periumbilical - s/p lap repair 12/09/2012 11/03/2012  . Obesity (BMI 30-39.9) 11/03/2012    Past Surgical History:  Procedure Laterality Date  . ARTHROSCOPY KNEE W/ DRILLING Bilateral   . COLONOSCOPY    . ESOPHAGOGASTRODUODENOSCOPY    . INSERTION OF MESH N/A 12/09/2012   Procedure: INSERTION OF MESH;  Surgeon: Adin Hector, MD;  Location: [facility];  Service: General;  Laterality: N/A;  . TOTAL KNEE ARTHROPLASTY Left 05/16/2015   Procedure: TOTAL KNEE ARTHROPLASTY;  Surgeon: Frederik Pear, MD;  Location: [facility];  Service: Orthopedics;  Laterality: Left;  . TOTAL KNEE ARTHROPLASTY Right 12/05/2015   Procedure: TOTAL KNEE ARTHROPLASTY;  Surgeon: Frederik Pear, MD;  Location: [facility];  Service: Orthopedics;  Laterality: Right;  .  VENTRAL HERNIA REPAIR N/A 12/09/2012   Procedure: LAPAROSCOPIC VENTRAL WALL HERNIA REPAIR;  Surgeon: Ardeth SportsmanSteven C. Gross, MD;  Location: [facility];  Service: General;  Laterality: N/A;    OB History   No obstetric history on file.      Home Medications    Prior to Admission medications   Medication Sig Start Date End Date Taking? Authorizing Provider  acetaminophen (TYLENOL) 500 MG tablet Take 1,000 mg by mouth every 6 (six) hours as needed for headache (pain).     [provider]  albuterol (PROVENTIL HFA;VENTOLIN HFA) 108 (90 Base) MCG/ACT inhaler Inhale 2 puffs into the lungs every 6 (six) hours as needed for wheezing or shortness of breath.     [provider]  fluticasone (FLONASE) 50 MCG/ACT nasal spray Place 2 sprays into both nostrils daily as needed for allergies or rhinitis.     [provider]  naproxen (NAPROSYN) 500 MG tablet Take 1 tablet (500 mg total) by mouth 2 (two) times daily. 07/14/18   Eustace MooreNelson, Marselino Slayton Sue, MD  nitrofurantoin, macrocrystal-monohydrate, (MACROBID) 100 MG capsule Take 1 capsule (100 mg total) by mouth 2 (two) times daily. 07/14/18   Eustace MooreNelson, Annisten Manchester Sue, MD  phenazopyridine (PYRIDIUM) 97 MG tablet Take 1 tablet (97 mg total) by mouth 3 (three) times daily as needed for pain. 07/14/18   Eustace MooreNelson, Taylorann Tkach Sue, MD    Family History Family History  Problem Relation Age of Onset  . Hypertension Mother   . Diabetes type II Mother   . Hypertension Father   . Colon cancer Father     Social History Social History   Tobacco Use  . Smoking status: Never Smoker  . Smokeless tobacco: Never Used  Substance Use Topics  . Alcohol use: No  . Drug use: No     Allergies   Oxycodone   Review of Systems Review of Systems  Constitutional: Negative for chills and fever.  HENT: Negative for ear pain and sore throat.   Eyes: Negative for pain and visual disturbance.  Respiratory: Negative for cough, chest tightness and shortness of breath.   Cardiovascular: Positive for leg swelling. Negative for chest pain and palpitations.  Gastrointestinal: Negative for abdominal pain and vomiting.  Genitourinary: Positive for dysuria and frequency. Negative for flank pain, hematuria, menstrual problem and vaginal discharge.  Musculoskeletal: Positive for arthralgias. Negative for back pain.  Skin: Negative for color change and rash.  Neurological: Negative for dizziness, seizures, syncope and light-headedness.  Psychiatric/Behavioral: The patient is not nervous/anxious.   All other systems reviewed and are negative.    Physical Exam Triage Vital Signs ED Triage Vitals  Enc Vitals Group     BP 07/14/18  1543 125/86     Pulse Rate 07/14/18 1543 81     Resp 07/14/18 1543 16     Temp 07/14/18 1543 97.8 F (36.6 C)     Temp Source 07/14/18 1543 Temporal     SpO2 07/14/18 1543 97 %     Weight --      Height --      Head Circumference --      Peak Flow --      Pain Score 07/14/18 1610 10     Pain Loc --      Pain Edu? --      Excl. in GC? --    No data found.  Updated Vital Signs BP 125/86 (BP Location: Left Arm)   Pulse 81   Temp 97.8 F (36.6 C) (  Temporal)   Resp 16   LMP 07/13/2018   SpO2 97%     Physical Exam Constitutional:      General: She is not in acute distress.    Appearance: She is well-developed. She is obese. She is not ill-appearing.  HENT:     Head: Normocephalic and atraumatic.     Nose: Nose normal.     Mouth/Throat:     Mouth: Mucous membranes are moist.  Eyes:     Conjunctiva/sclera: Conjunctivae normal.     Pupils: Pupils are equal, round, and reactive to light.  Neck:     Musculoskeletal: Normal range of motion.  Cardiovascular:     Rate and Rhythm: Normal rate and regular rhythm.     Heart sounds: Normal heart sounds. No murmur.  Pulmonary:     Effort: Pulmonary effort is normal. No respiratory distress.     Breath sounds: Normal breath sounds.  Abdominal:     General: There is no distension.     Palpations: Abdomen is soft.     Tenderness: There is no abdominal tenderness. There is no right CVA tenderness or left CVA tenderness.  Musculoskeletal: Normal range of motion.     Comments: Left shoulder has limited range of motion, especially to  ex tension and abduction.  Good rotation.  Normal grip strength.  No tenderness palpation.  Skin:    General: Skin is warm and dry.  Neurological:     Mental Status: She is alert.      UC Treatments / Results  Labs (all labs ordered are listed, but only abnormal results are displayed) Labs Reviewed  POCT URINALYSIS DIP (DEVICE) - Abnormal; Notable for the following components:      Result Value    Hgb urine dipstick TRACE (*)    Leukocytes,Ua SMALL (*)    All other components within normal limits    EKG None  Radiology No results found.  Procedures Procedures (including critical care time)  Medications Ordered in UC Medications - No data to display  Initial Impression / Assessment and Plan / UC Course  I have reviewed the triage vital signs and the nursing notes.  Pertinent labs & imaging results that were available during my care of the patient were reviewed by me and considered in my medical decision making (see chart for details).     Reviewed with patient that her EKG is normal.  Normal intervals, rate and rhythm.  No ST changes.  So at this time, no evidence of heart disease.  I explained to her that although this is good news, she is still having symptoms from palpitations that need to be followed up.  If they get worse instead of better she should go to the ER.  Otherwise take this issue up with her family doctor and see about getting a monitor Final Clinical Impressions(s) / UC Diagnoses   Final diagnoses:  Palpitations  Cystitis  Pain in joint of left shoulder     Discharge Instructions     For the bladder infection: Drink plenty of fluids Take nitrofurantoin 2 times a day Take the Pyridium plus as needed for urinary pain.  This will stain your urine orange.  For the shoulder pain: Take Naprosyn 2 times a day with food Avoid overhead lifting, if you are able, for a week  For the palpitations: Your EKG is completely normal You need to follow-up with a primary care doctor for additional testing If you have a prolonged period of palpitations  with chest pain, shortness of breath, or symptoms that you need to be seen in the emergency room or call 911   ED Prescriptions    Medication Sig Dispense Auth. Provider   naproxen (NAPROSYN) 500 MG tablet Take 1 tablet (500 mg total) by mouth 2 (two) times daily. 30 tablet Eustace MooreNelson, Aeron Lheureux Sue, MD    nitrofurantoin, macrocrystal-monohydrate, (MACROBID) 100 MG capsule Take 1 capsule (100 mg total) by mouth 2 (two) times daily. 10 capsule Eustace MooreNelson, Ritvik Mczeal Sue, MD   phenazopyridine (PYRIDIUM) 97 MG tablet Take 1 tablet (97 mg total) by mouth 3 (three) times daily as needed for pain. 10 tablet Eustace MooreNelson, Travas Schexnayder Sue, MD     Controlled Substance Prescriptions [place] Controlled Substance Registry consulted? Not Applicable   Eustace MooreNelson, Ebb Carelock Sue, MD 07/14/18 1735

## 2018-07-14 NOTE — Discharge Instructions (Addendum)
For the bladder infection: Drink plenty of fluids Take nitrofurantoin 2 times a day Take the Pyridium plus as needed for urinary pain.  This will stain your urine orange.  For the shoulder pain: Take Naprosyn 2 times a day with food Avoid overhead lifting, if you are able, for a week  For the palpitations: Your EKG is completely normal You need to follow-up with a primary care doctor for additional testing If you have a prolonged period of palpitations with chest pain, shortness of breath, or symptoms that you need to be seen in the emergency room or call 911

## 2018-07-14 NOTE — ED Triage Notes (Signed)
Pt presents with complaints of frequent urination and dysuria since Saturday.   Patient also states that for 1 week she has been having left upper arm pain, reports doing a lot of heavy lifting at work. Pt also endorses palpitations off and on x 1 week. Denies any chest pain reports mild shortness of breath with ambulation that is normal for her.

## 2018-10-07 DIAGNOSIS — M25512 Pain in left shoulder: Secondary | ICD-10-CM | POA: Diagnosis not present

## 2018-10-07 DIAGNOSIS — I10 Essential (primary) hypertension: Secondary | ICD-10-CM | POA: Diagnosis not present

## 2018-10-07 DIAGNOSIS — M7552 Bursitis of left shoulder: Secondary | ICD-10-CM | POA: Diagnosis not present

## 2018-11-05 DIAGNOSIS — I10 Essential (primary) hypertension: Secondary | ICD-10-CM | POA: Diagnosis not present

## 2018-11-12 DIAGNOSIS — I10 Essential (primary) hypertension: Secondary | ICD-10-CM | POA: Diagnosis not present

## 2018-11-14 ENCOUNTER — Other Ambulatory Visit: Payer: Self-pay

## 2018-11-14 ENCOUNTER — Ambulatory Visit (HOSPITAL_BASED_OUTPATIENT_CLINIC_OR_DEPARTMENT_OTHER)
Admit: 2018-11-14 | Discharge: 2018-11-14 | Disposition: A | Discharge status: Home / Self Care | Payer: BLUE CROSS/BLUE SHIELD

## 2018-11-14 ENCOUNTER — Encounter (HOSPITAL_COMMUNITY): Payer: Self-pay

## 2018-11-14 ENCOUNTER — Ambulatory Visit (HOSPITAL_COMMUNITY)
Admission: EM | Admit: 2018-11-14 | Discharge: 2018-11-14 | Disposition: A | Discharge status: Home / Self Care | Payer: BLUE CROSS/BLUE SHIELD | Attending: Emergency Medicine | Admitting: Emergency Medicine

## 2018-11-14 DIAGNOSIS — M79609 Pain in unspecified limb: Secondary | ICD-10-CM

## 2018-11-14 DIAGNOSIS — M7989 Other specified soft tissue disorders: Secondary | ICD-10-CM

## 2018-11-14 DIAGNOSIS — M79604 Pain in right leg: Secondary | ICD-10-CM | POA: Insufficient documentation

## 2018-11-14 DIAGNOSIS — Z86718 Personal history of other venous thrombosis and embolism: Secondary | ICD-10-CM | POA: Diagnosis not present

## 2018-11-14 MED ORDER — HYDROCODONE-ACETAMINOPHEN 5-325 MG PO TABS: 1.0000 | ORAL_TABLET | Freq: Four times a day (QID) | ORAL | 0 refills | Status: DC | PRN

## 2018-11-14 MED ORDER — DICLOFENAC POTASSIUM 50 MG PO TABS: 50.0000 mg | ORAL_TABLET | Freq: Two times a day (BID) | ORAL | 0 refills | Status: AC

## 2018-11-14 NOTE — ED Triage Notes (Signed)
Patient presents to Urgent Care with complaints of right leg pain since 2 weeks ago. Patient reports she has a hx of blood clots, one of which traveled to her lungs. Pt is not on blood thinners at this time.

## 2018-11-14 NOTE — ED Notes (Signed)
Pt is being wheeled down to the ED entrance by [facility] staff for a outpatient DVT study to rule out DVT in right leg.

## 2018-11-14 NOTE — ED Provider Notes (Signed)
[place]    CSN: [id number] Arrival date & time: 11/14/18  1618      History   Chief Complaint Chief Complaint  Patient presents with   Leg Pain    HPI Brandi Rivas is a 58 y.o. female history of previous DVT/PE, osteoarthritis, hypertension presenting today for evaluation of right leg pain and swelling.  Patient states that over the past 2 weeks she has had increased pain and swelling in her right leg.  States that the pain is posteriorly as well as anteriorly.  She does work 5 days a week for 8 hours on her feet, works at [employer].  She tries to wear compression stockings as well as elevate her feet after work.  She has not had a blood clot in a few years as well as not been on blood thinners in a few years.  She cannot relate the exact timeframe.  She has not noticed any redness.  She has tried using topical Voltaren as well as taking tizanidine without relief.  She also notes that she has varicose veins and unsure if the pain is related to this.  HPI  Past Medical History:  Diagnosis Date   Acute urinary retention 12/10/2012   Arthritis    History of anemia    "while pregnant"   History of blood clots    Hypertension    PREVIOUSLY TOOK MED - BP STABLE - TAKEN OFF MED   Pneumonia 01/09/2011   Sinus congestion    Urinary frequency    Ventral hernia    Wears glasses     Patient Active Problem List   Diagnosis Date Noted   Primary osteoarthritis of right knee 12/02/2015   Primary osteoarthritis of left knee 05/14/2015   Pulmonary emboli ([facility]) 04/18/2013   Pulmonary embolus ([place]) 04/17/2013   Constipation, chronic 12/29/2012   Hypertension    Ventral hernia - periumbilical - s/p lap repair 12/09/2012 11/03/2012   Obesity (BMI 30-39.9) 11/03/2012    Past Surgical History:  Procedure Laterality Date   ARTHROSCOPY KNEE W/ DRILLING Bilateral    COLONOSCOPY     ESOPHAGOGASTRODUODENOSCOPY     INSERTION OF MESH N/A 12/09/2012   Procedure: INSERTION OF MESH;  Surgeon: Adin Hector, MD;  Location: [facility];  Service: General;  Laterality: N/A;   TOTAL KNEE ARTHROPLASTY Left 05/16/2015   Procedure: TOTAL KNEE ARTHROPLASTY;  Surgeon: Frederik Pear, MD;  Location: [place];  Service: Orthopedics;  Laterality: Left;   TOTAL KNEE ARTHROPLASTY Right 12/05/2015   Procedure: TOTAL KNEE ARTHROPLASTY;  Surgeon: Frederik Pear, MD;  Location: [place];  Service: Orthopedics;  Laterality: Right;   VENTRAL HERNIA REPAIR N/A 12/09/2012   Procedure: LAPAROSCOPIC VENTRAL WALL HERNIA REPAIR;  Surgeon: Adin Hector, MD;  Location[facility];  Service: General;  Laterality: N/A;    OB History   No obstetric history on file.      Home Medications    Prior to Admission medications   Medication Sig Start Date End Date Taking? Authorizing Provider  losartan (COZAAR) 50 MG tablet  11/13/18  Yes [provider]  acetaminophen (TYLENOL) 500 MG tablet Take 1,000 mg by mouth every 6 (six) hours as needed for headache (pain).     [provider]  albuterol (PROVENTIL HFA;VENTOLIN HFA) 108 (90 Base) MCG/ACT inhaler Inhale 2 puffs into the lungs every 6 (six) hours as needed for wheezing or shortness of breath.    [provider]  diclofenac (CATAFLAM) 50 MG tablet  Take 1 tablet (50 mg total) by mouth 2 (two) times daily. 11/14/18   Loxley Cibrian C, PA-C  fluticasone (FLONASE) 50 MCG/ACT nasal spray Place 2 sprays into both nostrils daily as needed for allergies or rhinitis.     [provider]  HYDROcodone-acetaminophen (NORCO/VICODIN) 5-325 MG tablet Take 1 tablet by mouth every 6 (six) hours as needed for severe pain. 11/14/18   Jomarion Mish C, PA-C  naproxen (NAPROSYN) 500 MG tablet Take 1 tablet (500 mg total) by mouth 2 (two) times daily. 07/14/18   Eustace Moore, MD  nitrofurantoin, macrocrystal-monohydrate, (MACROBID) 100 MG capsule Take 1 capsule (100 mg total) by mouth 2 (two) times daily. 07/14/18    Eustace Moore, MD  phenazopyridine (PYRIDIUM) 97 MG tablet Take 1 tablet (97 mg total) by mouth 3 (three) times daily as needed for pain. 07/14/18   Eustace Moore, MD    Family History Family History  Problem Relation Age of Onset   Hypertension Mother    Diabetes type II Mother    Hypertension Father    Colon cancer Father     Social History Social History   Tobacco Use   Smoking status: Never Smoker   Smokeless tobacco: Never Used  Substance Use Topics   Alcohol use: No   Drug use: No     Allergies   Oxycodone   Review of Systems Review of Systems  Constitutional: Negative for fatigue and fever.  HENT: Negative for mouth sores.   Eyes: Negative for visual disturbance.  Respiratory: Negative for shortness of breath.   Cardiovascular: Positive for leg swelling. Negative for chest pain.  Gastrointestinal: Negative for abdominal pain, nausea and vomiting.  Genitourinary: Negative for genital sores.  Musculoskeletal: Negative for arthralgias and joint swelling.  Skin: Negative for color change, rash and wound.  Neurological: Negative for dizziness, weakness, light-headedness and headaches.     Physical Exam Triage Vital Signs ED Triage Vitals  Enc Vitals Group     BP 11/14/18 1637 (!) 135/92     Pulse Rate 11/14/18 1637 81     Resp 11/14/18 1637 17     Temp 11/14/18 1637 98.6 F (37 C)     Temp Source 11/14/18 1637 Oral     SpO2 11/14/18 1637 96 %     Weight --      Height --      Head Circumference --      Peak Flow --      Pain Score 11/14/18 1635 9     Pain Loc --      Pain Edu? --      Excl. in GC? --    No data found.  Updated Vital Signs BP (!) 135/92 (BP Location: Left Arm)    Pulse 81    Temp 98.6 F (37 C) (Oral)    Resp 17    SpO2 96%   Visual Acuity Right Eye Distance:   Left Eye Distance:   Bilateral Distance:    Right Eye Near:   Left Eye Near:    Bilateral Near:     Physical Exam Vitals signs and nursing note  reviewed.  Constitutional:      Appearance: She is well-developed.     Comments: No acute distress  HENT:     Head: Normocephalic and atraumatic.     Nose: Nose normal.  Eyes:     Conjunctiva/sclera: Conjunctivae normal.  Neck:     Musculoskeletal: Neck supple.  Cardiovascular:  Rate and Rhythm: Normal rate.  Pulmonary:     Effort: Pulmonary effort is normal. No respiratory distress.     Comments: Speaking in full sentences, breathing comfortably at rest, no coughing Abdominal:     General: There is no distension.  Musculoskeletal: Normal range of motion.     Comments: Right lower leg does appear slightly more swollen compared to left, proximal calf measuring approximately 49 cm compared to 46 on left.  Notable varicosities to anterior lower leg as well as posteriorly, mild posterior calf tenderness extending anteriorly over shin   Skin:    General: Skin is warm and dry.     Comments: No obvious erythema or discoloration to leg  Neurological:     Mental Status: She is alert and oriented to person, place, and time.      UC Treatments / Results  Labs (all labs ordered are listed, but only abnormal results are displayed) Labs Reviewed - No data to display  EKG   Radiology No results found.  Procedures Procedures (including critical care time)  Medications Ordered in UC Medications - No data to display  Initial Impression / Assessment and Plan / UC Course  I have reviewed the triage vital signs and the nursing notes.  Pertinent labs & imaging results that were available during my care of the patient were reviewed by me and considered in my medical decision making (see chart for details).     Patient with right lower leg pain, given history will send for ultrasound to rule out DVT although presentation not classic.  Advised if ultrasound positive for DVT will initiate back on Xarelto and have follow-up with PCP.  At any point developing shortness of breath, chest  pain to follow-up in emergency room.  If ultrasound negative, discussed using anti-inflammatories, will initiate on diclofenac 50 mg twice daily, continue compression and elevation, advised warm compresses for varicosities.  Did provide 6 tablets of hydrocodone to use for severe pain.  Discussed patient's allergy of itching with oxycodone, discussed this is a possible reaction with hydrocodone as well given cross-reactivity, patient believes she has tolerated hydrocodone before without reaction.  Does not like tramadol.  Advised if she has any allergic reaction symptoms to stop medicine immediately if having difficulty breathing to be seen emergently.  Discussed risks with taking hydrocodone, advised only at home or bedtime as causes drowsiness.  Registry checked and has not had any prescriptions for narcotics in the past few years.  Feel benefit greater than risk.  Discussed strict return precautions. Patient verbalized understanding and is agreeable with plan.  Final Clinical Impressions(s) / UC Diagnoses   Final diagnoses:  Right leg pain     Discharge Instructions     We are sending you for ultrasound to check for blood clot If there is no blood clot please take diclofenac twice daily Continue private compression stockings, elevate feet after work Warm compresses to varicose veins May use hydrocodone for severe pain, do not drive or work after taking, will cause drowsiness  Please follow-up if symptoms not resolving or worsening   ED Prescriptions    Medication Sig Dispense Auth. Provider   diclofenac (CATAFLAM) 50 MG tablet Take 1 tablet (50 mg total) by mouth 2 (two) times daily. 30 tablet Cebastian Neis C, PA-C   HYDROcodone-acetaminophen (NORCO/VICODIN) 5-325 MG tablet Take 1 tablet by mouth every 6 (six) hours as needed for severe pain. 6 tablet Leonilda Cozby, SteamboatHallie C, PA-C     I have reviewed the  PDMP during this encounter.   Lew Dawes, PA-C 11/14/18 1720

## 2018-11-14 NOTE — Progress Notes (Signed)
Right lower extremity venous duplex completed. Refer to "CV Proc" under chart review to view preliminary results.  11/14/2018 6:02 PM Maudry Mayhew, MHA, RVT, RDCS, RDMS

## 2018-11-14 NOTE — Discharge Instructions (Signed)
We are sending you for ultrasound to check for blood clot If there is no blood clot please take diclofenac twice daily Continue private compression stockings, elevate feet after work Warm compresses to varicose veins May use hydrocodone for severe pain, do not drive or work after taking, will cause drowsiness  Please follow-up if symptoms not resolving or worsening

## 2018-11-17 ENCOUNTER — Telehealth (HOSPITAL_COMMUNITY): Payer: Self-pay | Admitting: Emergency Medicine

## 2018-11-17 NOTE — Telephone Encounter (Signed)
Patient contacted and made aware of    results, all questions answered   

## 2018-12-19 DIAGNOSIS — Z1231 Encounter for screening mammogram for malignant neoplasm of breast: Secondary | ICD-10-CM | POA: Diagnosis not present

## 2018-12-23 DIAGNOSIS — R300 Dysuria: Secondary | ICD-10-CM | POA: Diagnosis not present

## 2018-12-23 DIAGNOSIS — N390 Urinary tract infection, site not specified: Secondary | ICD-10-CM | POA: Diagnosis not present

## 2018-12-23 DIAGNOSIS — M545 Low back pain: Secondary | ICD-10-CM | POA: Diagnosis not present

## 2019-01-19 DIAGNOSIS — I10 Essential (primary) hypertension: Secondary | ICD-10-CM | POA: Diagnosis not present

## 2019-01-19 DIAGNOSIS — R05 Cough: Secondary | ICD-10-CM | POA: Diagnosis not present

## 2019-06-17 ENCOUNTER — Other Ambulatory Visit: Payer: Self-pay

## 2019-06-17 ENCOUNTER — Encounter (HOSPITAL_COMMUNITY): Payer: Self-pay

## 2019-06-17 ENCOUNTER — Ambulatory Visit (HOSPITAL_COMMUNITY)
Admission: EM | Admit: 2019-06-17 | Discharge: 2019-06-17 | Disposition: A | Discharge status: Home / Self Care | Payer: Worker's Compensation | Attending: Family Medicine | Admitting: Family Medicine

## 2019-06-17 DIAGNOSIS — M79641 Pain in right hand: Secondary | ICD-10-CM

## 2019-06-17 DIAGNOSIS — T23171A Burn of first degree of right wrist, initial encounter: Secondary | ICD-10-CM

## 2019-06-17 MED ORDER — HYDROCODONE-ACETAMINOPHEN 5-325 MG PO TABS: 1.0000 | ORAL_TABLET | Freq: Three times a day (TID) | ORAL | 0 refills | Status: DC | PRN

## 2019-06-17 MED ORDER — HYDROCODONE-ACETAMINOPHEN 5-325 MG PO TABS: 2.0000 | ORAL_TABLET | ORAL | 0 refills | Status: AC | PRN

## 2019-06-17 NOTE — ED Provider Notes (Signed)
[facility]    CSN: [id number] Arrival date & time: 06/17/19  1549      History   Chief Complaint Chief Complaint  Patient presents with  . Hand Burn    HPI Brandi Rivas is a 59 y.o. female.  She is presenting with a burn of the right wrist on the dorsal wrist. This occurred at work yesterday. She hasn't lost feeling. She was burnt on the tea after making it. She has warmth over the area. The pain is moderate to severe. Localized to the wrist.   HPI  Past Medical History:  Diagnosis Date  . Acute urinary retention 12/10/2012  . Arthritis   . History of anemia    "while pregnant"  . History of blood clots   . Hypertension    PREVIOUSLY TOOK MED - BP STABLE - TAKEN OFF MED  . Pneumonia 01/09/2011  . Sinus congestion   . Urinary frequency   . Ventral hernia   . Wears glasses     Patient Active Problem List   Diagnosis Date Noted  . Primary osteoarthritis of right knee 12/02/2015  . Primary osteoarthritis of left knee 05/14/2015  . Pulmonary emboli (HCC) 04/18/2013  . Pulmonary embolus (HCC) 04/17/2013  . Constipation, chronic 12/29/2012  . Hypertension   . Ventral hernia - periumbilical - s/p lap repair 12/09/2012 11/03/2012  . Obesity (BMI 30-39.9) 11/03/2012    Past Surgical History:  Procedure Laterality Date  . ARTHROSCOPY KNEE W/ DRILLING Bilateral   . COLONOSCOPY    . ESOPHAGOGASTRODUODENOSCOPY    . INSERTION OF MESH N/A 12/09/2012   Procedure: INSERTION OF MESH;  Surgeon: Ardeth Sportsman, MD;  Location: [facility];  Service: General;  Laterality: N/A;  . TOTAL KNEE ARTHROPLASTY Left 05/16/2015   Procedure: TOTAL KNEE ARTHROPLASTY;  Surgeon: Gean Birchwood, MD;  Location: [facility];  Service: Orthopedics;  Laterality: Left;  . TOTAL KNEE ARTHROPLASTY Right 12/05/2015   Procedure: TOTAL KNEE ARTHROPLASTY;  Surgeon: Gean Birchwood, MD;  Location: [facility];  Service: Orthopedics;  Laterality: Right;  . VENTRAL HERNIA REPAIR N/A 12/09/2012   Procedure: LAPAROSCOPIC  VENTRAL WALL HERNIA REPAIR;  Surgeon: Ardeth Sportsman, MD;  Location: [facility];  Service: General;  Laterality: N/A;    OB History   No obstetric history on file.      Home Medications    Prior to Admission medications   Medication Sig Start Date End Date Taking? Authorizing Provider  albuterol (PROVENTIL HFA;VENTOLIN HFA) 108 (90 Base) MCG/ACT inhaler Inhale 2 puffs into the lungs every 6 (six) hours as needed for wheezing or shortness of breath.    [provider]  diclofenac (CATAFLAM) 50 MG tablet Take 1 tablet (50 mg total) by mouth 2 (two) times daily. 11/14/18   Wieters, Hallie C, PA-C  fluticasone (FLONASE) 50 MCG/ACT nasal spray Place 2 sprays into both nostrils daily as needed for allergies or rhinitis.     [provider]  HYDROcodone-acetaminophen (NORCO/VICODIN) 5-325 MG tablet Take 2 tablets by mouth every 4 (four) hours as needed. 06/17/19   Myra Rude, MD  losartan (COZAAR) 50 MG tablet  11/13/18   [provider]  naproxen (NAPROSYN) 500 MG tablet Take 1 tablet (500 mg total) by mouth 2 (two) times daily. 07/14/18   Eustace Moore, MD  nitrofurantoin, macrocrystal-monohydrate, (MACROBID) 100 MG capsule Take 1 capsule (100 mg total) by mouth 2 (two) times daily. 07/14/18   Eustace Moore, MD  phenazopyridine (PYRIDIUM) 97  MG tablet Take 1 tablet (97 mg total) by mouth 3 (three) times daily as needed for pain. 07/14/18   Eustace Moore, MD    Family History Family History  Problem Relation Age of Onset  . Hypertension Mother   . Diabetes type II Mother   . Hypertension Father   . Colon cancer Father     Social History Social History   Tobacco Use  . Smoking status: Never Smoker  . Smokeless tobacco: Never Used  Substance Use Topics  . Alcohol use: No  . Drug use: No     Allergies   Oxycodone   Review of Systems Review of Systems  See HPI   Physical Exam Triage Vital Signs ED Triage Vitals  Enc Vitals Group      BP 06/17/19 1636 119/73     Pulse Rate 06/17/19 1636 76     Resp 06/17/19 1636 18     Temp 06/17/19 1636 98.2 F (36.8 C)     Temp Source 06/17/19 1636 Oral     SpO2 06/17/19 1636 100 %     Weight 06/17/19 1634 232 lb (105.2 kg)     Height --      Head Circumference --      Peak Flow --      Pain Score 06/17/19 1634 10     Pain Loc --      Pain Edu? --      Excl. in GC? --    No data found.  Updated Vital Signs BP 119/73 (BP Location: Right Arm)   Pulse 76   Temp 98.2 F (36.8 C) (Oral)   Resp 18   Wt 105.2 kg   LMP 05/21/2019   SpO2 100%   BMI 37.45 kg/m   Visual Acuity Right Eye Distance:   Left Eye Distance:   Bilateral Distance:    Right Eye Near:   Left Eye Near:    Bilateral Near:     Physical Exam Gen: NAD, alert, cooperative with exam, well-appearing ENT: normal lips, normal nasal mucosa,  Eye: normal EOM, normal conjunctiva and lids Skin: no rashes, no areas of induration  Neuro: normal tone, normal sensation to touch Psych:  normal insight, alert and oriented MSK:  Right wrist:  Erythema over the right wrist with no skin break down  Warmth appreciated.  Has good range of motion of wrist and fingers  No blister  neurovascularly intact          UC Treatments / Results  Labs (all labs ordered are listed, but only abnormal results are displayed) Labs Reviewed - No data to display  EKG   Radiology No results found.  Procedures Procedures (including critical care time)  Medications Ordered in UC Medications - No data to display  Initial Impression / Assessment and Plan / UC Course  I have reviewed the triage vital signs and the nursing notes.  Pertinent labs & imaging results that were available during my care of the patient were reviewed by me and considered in my medical decision making (see chart for details).     Ms. Angevine is a 59 yo F that is presenting with a superfisicial burn on the right dorsal aspect of the wrist  after a injury sustained at work.  It appears to be superficial in nature.  Having erythema on exam.  Counseled on compartment syndrome and the symptoms associated with that.  Provided Norco for severe pain.  Provided work note.  Given indications to  follow-up for ongoing care.  Given indications to seek more immediate care.  Final Clinical Impressions(s) / UC Diagnoses   Final diagnoses:  Right hand pain  Burn erythema of right wrist, initial encounter     Discharge Instructions     Please remove your jewelery  Please keep the area cool  Please go to the emergency department if your hand becomes numb or you can't move it.     ED Prescriptions    Medication Sig Dispense Auth. Provider   HYDROcodone-acetaminophen (NORCO/VICODIN) 5-325 MG tablet  (Status: Discontinued) Take 1 tablet by mouth every 8 (eight) hours as needed for moderate pain. 15 tablet Rosemarie Ax, MD   HYDROcodone-acetaminophen (NORCO/VICODIN) 5-325 MG tablet Take 2 tablets by mouth every 4 (four) hours as needed. 10 tablet Rosemarie Ax, MD     I have reviewed the PDMP during this encounter.   Rosemarie Ax, MD 06/17/19 [phone]

## 2019-06-17 NOTE — ED Triage Notes (Signed)
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

## 2019-06-17 NOTE — Discharge Instructions (Signed)
Please remove your jewelery  Please keep the area cool  Please go to the emergency department if your hand becomes numb or you can't move it.

## 2019-06-18 ENCOUNTER — Telehealth: Payer: Self-pay | Admitting: Family Medicine

## 2019-06-18 NOTE — Telephone Encounter (Signed)
Called and informed patient to take norco q8 PRN for severe pain as opposed to 2 tablets every 4 hours.   Myra Rude, MD [facility] 06/18/2019, 12:19 PM

## 2020-07-06 ENCOUNTER — Emergency Department (HOSPITAL_BASED_OUTPATIENT_CLINIC_OR_DEPARTMENT_OTHER): Payer: BLUE CROSS/BLUE SHIELD

## 2020-07-06 ENCOUNTER — Other Ambulatory Visit: Payer: Self-pay

## 2020-07-06 ENCOUNTER — Emergency Department (HOSPITAL_COMMUNITY)
Admission: EM | Admit: 2020-07-06 | Discharge: 2020-07-06 | Disposition: A | Discharge status: Home / Self Care | Payer: BLUE CROSS/BLUE SHIELD | Attending: Emergency Medicine | Admitting: Emergency Medicine

## 2020-07-06 DIAGNOSIS — I10 Essential (primary) hypertension: Secondary | ICD-10-CM | POA: Diagnosis not present

## 2020-07-06 DIAGNOSIS — Z96653 Presence of artificial knee joint, bilateral: Secondary | ICD-10-CM | POA: Diagnosis not present

## 2020-07-06 DIAGNOSIS — M7989 Other specified soft tissue disorders: Secondary | ICD-10-CM

## 2020-07-06 DIAGNOSIS — I872 Venous insufficiency (chronic) (peripheral): Secondary | ICD-10-CM | POA: Insufficient documentation

## 2020-07-06 LAB — I-STAT CHEM 8, ED
BUN: 17 mg/dL (ref 6–20)
Calcium, Ion: 1.24 mmol/L (ref 1.15–1.40)
Chloride: 104 mmol/L (ref 98–111)
Creatinine, Ser: 0.7 mg/dL (ref 0.44–1.00)
Glucose, Bld: 89 mg/dL (ref 70–99)
HCT: 39 % (ref 36.0–46.0)
Hemoglobin: 13.3 g/dL (ref 12.0–15.0)
Potassium: 4 mmol/L (ref 3.5–5.1)
Sodium: 140 mmol/L (ref 135–145)
TCO2: 25 mmol/L (ref 22–32)

## 2020-07-06 NOTE — ED Triage Notes (Signed)
Pt arrives with R leg swelling and pain that is new when she woke up this morning. Denies recent long travel.

## 2020-07-06 NOTE — ED Provider Notes (Signed)
[facility] EMERGENCY DEPARTMENT Provider Note   CSN: [id number] Arrival date & time: 07/06/20  1614     History Chief Complaint  Patient presents with  . Leg Pain    Brandi Rivas is a 60 y.o. female.  Patient is a 60 year old female with prior history of PE and DVT no longer on anticoagulation, hypertension, bilateral knee replacements who is presenting today with complaints of right leg swelling and discomfort.  Patient noticed it this morning when she woke up but it has occurred intermittently in the past.  Patient does a job where she stands most of the time but states she does wear compression socks when she is at work.  The swelling does seem to be better after she has slept in in the morning and worsens throughout the day.  She has had no recent fall or trauma.  She denies any chest pain or shortness of breath.  She has had no recent change in blood pressure medications or prolonged immobilization.  Patient seen in triage and had labs and imaging.  The history is provided by the patient.  Leg Pain Location:  Leg      Past Medical History:  Diagnosis Date  . Acute urinary retention 12/10/2012  . Arthritis   . History of anemia    "while pregnant"  . History of blood clots   . Hypertension    PREVIOUSLY TOOK MED - BP STABLE - TAKEN OFF MED  . Pneumonia 01/09/2011  . Sinus congestion   . Urinary frequency   . Ventral hernia   . Wears glasses     Patient Active Problem List   Diagnosis Date Noted  . Primary osteoarthritis of right knee 12/02/2015  . Primary osteoarthritis of left knee 05/14/2015  . Pulmonary emboli (HCC) 04/18/2013  . Pulmonary embolus (HCC) 04/17/2013  . Constipation, chronic 12/29/2012  . Hypertension   . Ventral hernia - periumbilical - s/p lap repair 12/09/2012 11/03/2012  . Obesity (BMI 30-39.9) 11/03/2012    Past Surgical History:  Procedure Laterality Date  . ARTHROSCOPY KNEE W/ DRILLING Bilateral   . COLONOSCOPY     . ESOPHAGOGASTRODUODENOSCOPY    . INSERTION OF MESH N/A 12/09/2012   Procedure: INSERTION OF MESH;  Surgeon: Ardeth Sportsman, MD;  Location: [facility];  Service: General;  Laterality: N/A;  . TOTAL KNEE ARTHROPLASTY Left 05/16/2015   Procedure: TOTAL KNEE ARTHROPLASTY;  Surgeon: Gean Birchwood, MD;  Location: [facility];  Service: Orthopedics;  Laterality: Left;  . TOTAL KNEE ARTHROPLASTY Right 12/05/2015   Procedure: TOTAL KNEE ARTHROPLASTY;  Surgeon: Gean Birchwood, MD;  Location: [facility];  Service: Orthopedics;  Laterality: Right;  . VENTRAL HERNIA REPAIR N/A 12/09/2012   Procedure: LAPAROSCOPIC VENTRAL WALL HERNIA REPAIR;  Surgeon: Ardeth Sportsman, MD;  Location: [facility];  Service: General;  Laterality: N/A;     OB History   No obstetric history on file.     Family History  Problem Relation Age of Onset  . Hypertension Mother   . Diabetes type II Mother   . Hypertension Father   . Colon cancer Father     Social History   Tobacco Use  . Smoking status: Never Smoker  . Smokeless tobacco: Never Used  Vaping Use  . Vaping Use: Never used  Substance Use Topics  . Alcohol use: No  . Drug use: No    Home Medications Prior to Admission medications   Medication Sig Start Date End Date Taking? Authorizing Provider  albuterol (PROVENTIL HFA;VENTOLIN HFA) 108 (90 Base) MCG/ACT inhaler Inhale 2 puffs into the lungs every 6 (six) hours as needed for wheezing or shortness of breath.    [provider]  diclofenac (CATAFLAM) 50 MG tablet Take 1 tablet (50 mg total) by mouth 2 (two) times daily. 11/14/18   Wieters, Hallie C, PA-C  fluticasone (FLONASE) 50 MCG/ACT nasal spray Place 2 sprays into both nostrils daily as needed for allergies or rhinitis.     [provider]  HYDROcodone-acetaminophen (NORCO/VICODIN) 5-325 MG tablet Take 2 tablets by mouth every 4 (four) hours as needed. 06/17/19   Myra RudeSchmitz, Jeremy E, MD  losartan (COZAAR) 50 MG tablet  11/13/18   [provider]   naproxen (NAPROSYN) 500 MG tablet Take 1 tablet (500 mg total) by mouth 2 (two) times daily. 07/14/18   Eustace MooreNelson, Yvonne Sue, MD  nitrofurantoin, macrocrystal-monohydrate, (MACROBID) 100 MG capsule Take 1 capsule (100 mg total) by mouth 2 (two) times daily. 07/14/18   Eustace MooreNelson, Yvonne Sue, MD  phenazopyridine (PYRIDIUM) 97 MG tablet Take 1 tablet (97 mg total) by mouth 3 (three) times daily as needed for pain. 07/14/18   Eustace MooreNelson, Yvonne Sue, MD    Allergies    Oxycodone  Review of Systems   Review of Systems  All other systems reviewed and are negative.   Physical Exam Updated Vital Signs BP (!) 148/95 (BP Location: Right Arm)   Pulse 66   Temp 97.7 F (36.5 C) (Oral)   Resp 18   SpO2 98%   Physical Exam Vitals and nursing note reviewed.  Constitutional:      General: She is not in acute distress.    Appearance: Normal appearance.  Cardiovascular:     Rate and Rhythm: Normal rate.     Pulses: Normal pulses.  Pulmonary:     Effort: Pulmonary effort is normal. No respiratory distress.  Musculoskeletal:        General: Swelling present.     Comments: Nonpitting edema present in bilateral lower extremities right greater than left.  Bilateral scars over the knees consistent with knee replacement.  No erythema, fluctuance or induration.  Minimal tenderness with palpation of the right lower leg  Skin:    General: Skin is warm.     Capillary Refill: Capillary refill takes less than 2 seconds.  Neurological:     Mental Status: She is alert and oriented to person, place, and time. Mental status is at baseline.  Psychiatric:        Mood and Affect: Mood normal.        Behavior: Behavior normal.      ED Results / Procedures / Treatments   Labs (all labs ordered are listed, but only abnormal results are displayed) Labs Reviewed  I-STAT CHEM 8, ED    EKG None  Radiology VAS US LOWER EXTREMITY VENOUS (DVT) (ONLY [facility] 7a-7p)  Result Date: 07/06/2020  Lower Venous DVT Study  Patient Name:  Brandi Rivas  Date of Exam:   07/06/2020 Medical Rec #: [id number]         Accession #:    [id number] Date of Birth: 1960-11-27        Patient Gender: F Patient Age:   [age]Y [facility] Procedure:      VAS US LOWER EXTREMITY VENOUS (DVT) Referring Phys: [id number] BOWIE TRAN --------------------------------------------------------------------------------  Indications: RLE calf swelling x 1 day.  Risk Factors: DVT HX of RLE SVT & LLE DVT. Comparison  Study: Previous exam 11/14/18 - negative Performing Technologist: Jody Hill RVT, RDMS  Examination Guidelines: A complete evaluation includes B-mode imaging, spectral Doppler, color Doppler, and power Doppler as needed of all accessible portions of each vessel. Bilateral testing is considered an integral part of a complete examination. Limited examinations for reoccurring indications may be performed as noted. The reflux portion of the exam is performed with the patient in reverse Trendelenburg.  +---------+---------------+---------+-----------+----------+--------------+ RIGHT    CompressibilityPhasicitySpontaneityPropertiesThrombus Aging +---------+---------------+---------+-----------+----------+--------------+ CFV      Full           Yes      Yes                                 +---------+---------------+---------+-----------+----------+--------------+ SFJ      Full                                                        +---------+---------------+---------+-----------+----------+--------------+ FV Prox  Full           Yes      Yes                                 +---------+---------------+---------+-----------+----------+--------------+ FV Mid   Full           Yes      Yes                                 +---------+---------------+---------+-----------+----------+--------------+ FV DistalFull           Yes      Yes                                  +---------+---------------+---------+-----------+----------+--------------+ PFV      Full                                                        +---------+---------------+---------+-----------+----------+--------------+ POP      Full           Yes      Yes                                 +---------+---------------+---------+-----------+----------+--------------+ PTV      Full                                                        +---------+---------------+---------+-----------+----------+--------------+ PERO     Full                                                        +---------+---------------+---------+-----------+----------+--------------+   +----+---------------+---------+-----------+----------+--------------+  LEFTCompressibilityPhasicitySpontaneityPropertiesThrombus Aging +----+---------------+---------+-----------+----------+--------------+ CFV Full           Yes      Yes                                 +----+---------------+---------+-----------+----------+--------------+     Summary: RIGHT: - There is no evidence of deep vein thrombosis in the lower extremity. - There is no evidence of superficial venous thrombosis.  - No cystic structure found in the popliteal fossa. Subcutaneous edema in distal calf/ankle.  LEFT: - No evidence of common femoral vein obstruction.  *See table(s) above for measurements and observations.    Preliminary     Procedures Procedures   Medications Ordered in ED Medications - No data to display  ED Course  I have reviewed the triage vital signs and the nursing notes.  Pertinent labs & imaging results that were available during my care of the patient were reviewed by me and considered in my medical decision making (see chart for details).    MDM Rules/Calculators/A&P                          Patient is a 60 year old female presenting today with right lower leg swelling that she noticed this morning.  Patient i-STAT  Chem-8 was within normal limits and DVT ultrasound was not negative except for some subcutaneous edema.  Patient does wear compression socks at work but stands all day.  Suspect given her prior DVT she most likely has some venous insufficiency and stasis.  She has no evidence of cellulitis, acute joint abnormality and has normal distal pulses with low suspicion for arterial abnormality.  Patient will continue to wear her compression socks and elevate her feet when she can.  Low suspicion for CHF today.  MDM Number of Diagnoses or Management Options   Amount and/or Complexity of Data Reviewed Clinical lab tests: ordered and reviewed Tests in the radiology section of CPT: reviewed and ordered Independent visualization of images, tracings, or specimens: yes   Final Clinical Impression(s) / ED Diagnoses Final diagnoses:  Venous insufficiency of right lower extremity    Rx / DC Orders ED Discharge Orders    None       Gwyneth Sprout, MD 07/06/20 2112

## 2020-07-06 NOTE — ED Provider Notes (Signed)
Emergency Medicine Provider Triage Evaluation Note  Brandi Rivas , a 60 y.o. female  was evaluated in triage.  Pt complains of R leg pain.  Review of Systems  Positive: R leg pain, swelling Negative: Fever, worsening SOB, trauma  Physical Exam  BP 129/89 (BP Location: Right Arm)   Pulse 76   Temp 98.5 F (36.9 C) (Oral)   Resp 18   SpO2 100%  Gen:   Awake, no distress   Resp:  Normal effort  MSK:   Moves extremities without difficulty  Other:  RLE: 2+ pitting edema with tenderness to calf  Medical Decision Making  Medically screening exam initiated at 5:55 PM.  Appropriate orders placed.  Brandi Rivas was informed that the remainder of the evaluation will be completed by another provider, this initial triage assessment does not replace that evaluation, and the importance of remaining in the ED until their evaluation is complete.  Atraumatic R leg pain and swelling x1 day.  Hx of DVT in the past.     Brandi Rivas, Brandi Rivas 07/06/20 1756    Brandi Sprout, MD 07/06/20 Brandi Rivas

## 2020-07-06 NOTE — Progress Notes (Signed)
RLE venous duplex has been completed.  Preliminary results given to triage, RN.  Results can be found under chart review under CV PROC. 07/06/2020 6:49 PM Chere Babson RVT, RDMS

## 2020-07-06 NOTE — Discharge Instructions (Signed)
No blood clot today.  Keep wearing your compression stockings and elevate your legs when you can.

## 2021-06-13 ENCOUNTER — Other Ambulatory Visit (HOSPITAL_BASED_OUTPATIENT_CLINIC_OR_DEPARTMENT_OTHER): Payer: Self-pay | Admitting: Internal Medicine

## 2021-06-13 DIAGNOSIS — Z0289 Encounter for other administrative examinations: Secondary | ICD-10-CM

## 2021-07-07 ENCOUNTER — Inpatient Hospital Stay (HOSPITAL_BASED_OUTPATIENT_CLINIC_OR_DEPARTMENT_OTHER): Admission: RE | Admit: 2021-07-07 | Payer: BLUE CROSS/BLUE SHIELD | Source: Ambulatory Visit | Admitting: Radiology

## 2022-05-22 ENCOUNTER — Encounter: Payer: Self-pay | Admitting: *Deleted

## 2022-07-17 ENCOUNTER — Encounter: Payer: Self-pay | Admitting: Internal Medicine

## 2022-07-18 ENCOUNTER — Other Ambulatory Visit: Payer: Self-pay | Admitting: Internal Medicine

## 2022-07-18 DIAGNOSIS — N63 Unspecified lump in unspecified breast: Secondary | ICD-10-CM

## 2023-08-19 ENCOUNTER — Encounter: Payer: Self-pay | Admitting: Physician Assistant

## 2023-08-19 ENCOUNTER — Ambulatory Visit: Attending: Physician Assistant | Admitting: Physician Assistant

## 2023-08-19 VITALS — BP 130/84 | HR 72 | Ht 77.0 in | Wt 240.2 lb

## 2023-08-19 DIAGNOSIS — R0602 Shortness of breath: Secondary | ICD-10-CM | POA: Diagnosis not present

## 2023-08-19 DIAGNOSIS — R600 Localized edema: Secondary | ICD-10-CM | POA: Diagnosis not present

## 2023-08-19 DIAGNOSIS — I10 Essential (primary) hypertension: Secondary | ICD-10-CM

## 2023-08-19 DIAGNOSIS — Z86711 Personal history of pulmonary embolism: Secondary | ICD-10-CM

## 2023-08-19 NOTE — Progress Notes (Signed)
 OFFICE NOTE:    Date:  08/19/2023  ID:  Brandi Rivas, Parker January 22, 1961, MRN [id number] PCP: Brandi Charlie ORN, MD   HeartCare Providers Cardiologist:  None        TTE 04/18/13: EF 55-60, no RWMA, NL RVSF Leg edema Hyperlipidemia Hypertension History of DVT, pulmonary embolism 2015 Gout       Discussed the use of AI scribe software for clinical note transcription with the patient, who gave verbal consent to proceed. History of Present Illness Brandi Rivas is a 63 y.o. female referred by Angelia Pierce, NP for leg edema.   She experiences swelling in her right leg, which improves with elevation and compression. She has a history of deep vein thrombosis in the right leg and a pulmonary embolism, both occurring at separate times. The deep vein thrombosis occurred after surgery, while the pulmonary embolism did not follow a surgical procedure.  She notes that she did take anticoagulation for a long time afterward.  This was stopped at some point.  She experiences significant shortness of breath with exertion, which has been a long-standing issue.  She notes symptoms with mild to moderate exertion.  She has not had chest pain.  She does sleep on 2 pillows and describes what sounds like PND versus apnea.  She has not had syncope.  She has never been a smoker.  She works at [employer].  She is not married.  She has 3 children.  Her mother is alive and has a history of congestive heart failure.  She has a son with congestive heart failure as well.   Review of Systems  Gastrointestinal:  Negative for hematochezia and melena.  Genitourinary:  Negative for hematuria.  -See HPI    Studies Reviewed:  EKG Interpretation Date/Time:  Monday August 19 2023 14:28:47 EDT Ventricular Rate:  73 PR Interval:  142 QRS Duration:  92 QT Interval:  408 QTC Calculation: 449 R Axis:   46  Text Interpretation: Sinus rhythm with occasional Premature ventricular complexes Nonspecific T  wave abnormality Confirmed by Lelon Hamilton [phone]) on 08/19/2023 2:52:56 PM   Labs from primary care dated 06/25/2023 personally reviewed and interpreted on 08/19/2023: Hgb 12.2, creatinine 0.7, K 4, ALT 17, Alb 3.9, A1c 5, total cholesterol 177, triglycerides 99, LDL 110  [facility] labs 06/26/23: TSH 1.27  Venous duplex 07/06/20: No R DVT; no L CFV obstruction        Physical Exam:  VS:  BP 130/84   Pulse 72   Ht 6' 5 (1.956 m)   Wt 240 lb 3.2 oz (109 kg)   SpO2 100%   BMI 28.48 kg/m        Wt Readings from Last 3 Encounters:  08/19/23 240 lb 3.2 oz (109 kg)  06/17/19 232 lb (105.2 kg)  05/06/17 239 lb (108.4 kg)    Constitutional:      Appearance: Healthy appearance. Not in distress.  Neck:     Vascular: No carotid bruit or JVR. JVD normal.  Pulmonary:     Breath sounds: Normal breath sounds. No wheezing. No rales.  Cardiovascular:     Normal rate. Regular rhythm.     Murmurs: There is no murmur.  Edema:    Peripheral edema present.    Pretibial: bilateral trace edema of the pretibial area.    Ankle: trace edema of the left ankle and 1+ edema of the right ankle. Abdominal:     Palpations: Abdomen is soft.  Skin:  General: Skin is warm and dry.       Assessment and Plan:    Assessment & Plan Lower extremity edema Shortness of breath History of pulmonary embolism She has a history of DVT as well as a separate history of pulmonary embolism.  She took anticoagulation for quite some time but this was eventually stopped.  She notes right lower extremity edema.  This was the leg she had her DVT in.  The edema seems to improve with elevation and compression.  She wears compression hose on a daily basis.  Edema seems to get worse throughout the day.  She likely has venous insufficiency.  She had a recent venous duplex that was negative for DVT.  She does have significant shortness of breath with exertion.  She also describes orthopnea and PND.  On exam, her lungs are clear and neck  veins are flat.  She does not really have significant edema in her legs today.  Differential diagnosis is broad and includes CHF, pulmonary hypertension related to chronic thromboembolic disease, deconditioning.  Her right leg edema could be related to postphlebitic syndrome.  However, as noted, her edema is not that severe.  She is not having chest discomfort to suggest angina. - Continue compression, elevation - Continue furosemide 20 mg daily - Obtain BMET, BNP - Adjust furosemide if BNP elevated - Obtain echocardiogram to rule out structural heart disease, assess pulmonary pressures - Consider sleep testing, VQ scan if pulmonary hypertension noted on echocardiogram - Consider referral to vascular surgery if edema becomes more problematic Essential hypertension Blood pressure management with losartan.  - Continue losartan 50 mg daily         Dispo:  Return in about 3 months (around 11/19/2023) for Follow up after testing, w/ Glendia Ferrier, PA-C.  Signed, Glendia Ferrier, PA-C

## 2023-08-19 NOTE — Patient Instructions (Signed)
 Medication Instructions:  Your physician recommends that you continue on your current medications as directed. Please refer to the Current Medication list given to you today.  *If you need a refill on your cardiac medications before your next appointment, please call your pharmacy*  Lab Work: TODAY:  BMET & PRO BNP  If you have labs (blood work) drawn today and your tests are completely normal, you will receive your results only by: MyChart Message (if you have MyChart) OR A paper copy in the mail If you have any lab test that is abnormal or we need to change your treatment, we will call you to review the results.  Testing/Procedures: Your physician has requested that you have an echocardiogram. Echocardiography is a painless test that uses sound waves to create images of your heart. It provides your doctor with information about the size and shape of your heart and how well your heart's chambers and valves are working. This procedure takes approximately one hour. There are no restrictions for this procedure. Please do NOT wear cologne, perfume, aftershave, or lotions (deodorant is allowed). Please arrive 15 minutes prior to your appointment time.  Please note: We ask at that you not bring children with you during ultrasound (echo/ vascular) testing. Due to room size and safety concerns, children are not allowed in the ultrasound rooms during exams. Our front office staff cannot provide observation of children in our lobby area while testing is being conducted. An adult accompanying a patient to their appointment will only be allowed in the ultrasound room at the discretion of the ultrasound technician under special circumstances. We apologize for any inconvenience.   Follow-Up: At [facility], you and your health needs are our priority.  As part of our continuing mission to provide you with exceptional heart care, our providers are all part of one team.  This team includes your  primary Cardiologist (physician) and Advanced Practice Providers or APPs (Physician Assistants and Nurse Practitioners) who all work together to provide you with the care you need, when you need it.  Your next appointment:   3 month(s)  Provider:   Glendia Ferrier, PA-C          We recommend signing up for the patient portal called MyChart.  Sign up information is provided on this After Visit Summary.  MyChart is used to connect with patients for Virtual Visits (Telemedicine).  Patients are able to view lab/test results, encounter notes, upcoming appointments, etc.  Non-urgent messages can be sent to your provider as well.   To learn more about what you can do with MyChart, go to [email or web address].   Other Instructions

## 2023-08-20 LAB — BASIC METABOLIC PANEL WITH GFR
BUN/Creatinine Ratio: 25 (ref 12–28)
BUN: 20 mg/dL (ref 8–27)
CO2: 22 mmol/L (ref 20–29)
Calcium: 8.9 mg/dL (ref 8.7–10.3)
Chloride: 101 mmol/L (ref 96–106)
Creatinine, Ser: 0.8 mg/dL (ref 0.57–1.00)
Glucose: 79 mg/dL (ref 70–99)
Potassium: 4 mmol/L (ref 3.5–5.2)
Sodium: 140 mmol/L (ref 134–144)
eGFR: 84 mL/min/1.73 (ref >59)

## 2023-08-20 LAB — PRO B NATRIURETIC PEPTIDE: NT-Pro BNP: 46 pg/mL (ref 0–287)

## 2023-08-21 ENCOUNTER — Ambulatory Visit: Payer: Self-pay | Admitting: Physician Assistant

## 2023-08-21 DIAGNOSIS — R072 Precordial pain: Secondary | ICD-10-CM

## 2023-08-21 DIAGNOSIS — I2729 Other secondary pulmonary hypertension: Secondary | ICD-10-CM

## 2023-08-21 DIAGNOSIS — R911 Solitary pulmonary nodule: Secondary | ICD-10-CM

## 2023-08-28 NOTE — Telephone Encounter (Signed)
 Pt returning nurse call

## 2023-08-30 ENCOUNTER — Telehealth: Payer: Self-pay | Admitting: Physician Assistant

## 2023-08-30 NOTE — Telephone Encounter (Signed)
 Patient is returning call to discuss labs.

## 2023-08-30 NOTE — Telephone Encounter (Signed)
 Patient identification verified by 2 forms.   Called and spoke to patient  Went over lab results.  No questions expressed at this time.

## 2023-09-25 ENCOUNTER — Ambulatory Visit (HOSPITAL_COMMUNITY)
Admission: RE | Admit: 2023-09-25 | Discharge: 2023-09-25 | Disposition: A | Discharge status: Home / Self Care | Source: Ambulatory Visit | Attending: Cardiovascular Disease | Admitting: Cardiovascular Disease

## 2023-09-25 DIAGNOSIS — R0602 Shortness of breath: Secondary | ICD-10-CM | POA: Insufficient documentation

## 2023-09-25 DIAGNOSIS — Z86711 Personal history of pulmonary embolism: Secondary | ICD-10-CM | POA: Insufficient documentation

## 2023-09-25 DIAGNOSIS — R600 Localized edema: Secondary | ICD-10-CM | POA: Insufficient documentation

## 2023-09-25 LAB — ECHOCARDIOGRAM COMPLETE
Area-P 1/2: 3.48 cm2
S' Lateral: 4.2 cm

## 2023-10-01 ENCOUNTER — Encounter: Payer: Self-pay | Admitting: Physician Assistant

## 2023-10-01 DIAGNOSIS — I2729 Other secondary pulmonary hypertension: Secondary | ICD-10-CM | POA: Insufficient documentation

## 2023-10-01 MED ORDER — METOPROLOL SUCCINATE ER 25 MG PO TB24: 25.0000 mg | ORAL_TABLET | Freq: Every day | ORAL | 3 refills | Status: AC

## 2023-10-02 ENCOUNTER — Other Ambulatory Visit (HOSPITAL_COMMUNITY): Payer: Self-pay | Admitting: Emergency Medicine

## 2023-10-09 ENCOUNTER — Telehealth: Payer: Self-pay | Admitting: Physician Assistant

## 2023-10-09 NOTE — Telephone Encounter (Signed)
 Patient stated she wants to get CT Scan orders sent to [facility] as they are in her network.  Patient provided fax# [phone] and phone# [phone].

## 2023-10-16 MED ORDER — METOPROLOL TARTRATE 100 MG PO TABS: 100.0000 mg | ORAL_TABLET | Freq: Once | ORAL | 0 refills | Status: DC

## 2023-10-19 ENCOUNTER — Encounter: Payer: Self-pay | Admitting: *Deleted

## 2023-10-19 LAB — GLUCOSE, POCT (MANUAL RESULT ENTRY): Glucose Fasting, POC: 113 mg/dL — AB (ref 70–99)

## 2023-10-24 NOTE — Telephone Encounter (Signed)
 Returned call to pt.  She said her insurance said we was out of network and she cancelled her appt.  I advised pt to make sure that her insurance was telling her correctly about the office visits, that sometimes the imaging piece is different, but she still may be able to keep her Cardiology care here.

## 2023-10-24 NOTE — Telephone Encounter (Signed)
 Patient calling to speak to the nurse. Her insurance states that we are not in network. Please advise

## 2023-10-29 ENCOUNTER — Telehealth: Payer: Self-pay | Admitting: Physician Assistant

## 2023-10-29 NOTE — Telephone Encounter (Signed)
 Attempted to contact patient. Left message to call back on personal voicemail.

## 2023-10-29 NOTE — Telephone Encounter (Signed)
  Patient had CT angio today. She is asking if the order for CT Morph can be sent to [facility] for her to have it done there?

## 2023-10-30 NOTE — Telephone Encounter (Signed)
 Returned call to pt.  Left a detailed message that both orders were faxed together.. if we needed to resend to let us  know.  It may take a little longer to get the Cardiac CT.

## 2023-11-13 NOTE — Telephone Encounter (Signed)
 Do you mind trying to refax the order for the coronary CTA? Spencer Peterkin

## 2023-11-14 ENCOUNTER — Encounter: Payer: Self-pay | Admitting: *Deleted

## 2023-11-14 ENCOUNTER — Telehealth: Payer: Self-pay | Admitting: *Deleted

## 2023-11-14 NOTE — Telephone Encounter (Signed)
 Tried to fax the Auth # below to [facility], Fax# [phone], kept saying send failed, busy.  Called the # given, [phone], spoke with [place].  He has been verbally given auth # and valid dates.

## 2023-11-14 NOTE — Telephone Encounter (Signed)
-----   Message from [place] A sent at 11/14/2023  3:12 PM EDT ----- Brandi Rivas is now on file for the Cardiac CT at [facility].  Auth # [id number] valid dates are 11/14/23 - 12/13/23.  Thank you ----- Message ----- From: Memory Nest, RMA Sent: 11/14/2023   1:47 PM EDT To: C[facility] Div Heartcare Pre Cert/Auth  Pt needs a Cardiac CT at [facility].  I have already faxed the order to them.  The NPI # [id number].  Can someone get authorization and valid dates please.

## 2023-11-20 ENCOUNTER — Ambulatory Visit: Admitting: Physician Assistant

## 2023-12-10 NOTE — Progress Notes (Signed)
 The patient attended a screening event on 10/19/2023 where her BP screening results was 122/80, fasting blood glucose was 113. At the event the patient noted she has [employer] and does not smoke. Patient did not indicate having any SDOH insecurities at the screening event. Pt listed pcp as Dr. Charlie ORN Tisovec MD at [facility]. Per chart review pt has a pcp and the last pcp office visit is not visible in [facility] and there is no encounters visibile for pt. Post event initial f/u [facility] called pt pcp office on 12/10/2023 to confirm that pt is established with pcp and the last office visit was on 06/25/2023 for a physical exam. Pt has a appt with pcp today at 4pm.  No additional Health equity team support indicated at this time.

## 2023-12-11 ENCOUNTER — Encounter: Payer: Self-pay | Admitting: Physician Assistant

## 2024-01-07 ENCOUNTER — Encounter: Payer: Self-pay | Admitting: *Deleted

## 2024-01-08 ENCOUNTER — Ambulatory Visit: Payer: Self-pay | Attending: Cardiology | Admitting: Physician Assistant

## 2024-01-08 ENCOUNTER — Encounter: Payer: Self-pay | Admitting: Physician Assistant

## 2024-01-08 VITALS — BP 118/78 | HR 79 | Ht 66.0 in | Wt 243.0 lb

## 2024-01-08 DIAGNOSIS — R600 Localized edema: Secondary | ICD-10-CM | POA: Diagnosis not present

## 2024-01-08 DIAGNOSIS — I5032 Chronic diastolic (congestive) heart failure: Secondary | ICD-10-CM | POA: Diagnosis not present

## 2024-01-08 DIAGNOSIS — I2729 Other secondary pulmonary hypertension: Secondary | ICD-10-CM

## 2024-01-08 DIAGNOSIS — R0602 Shortness of breath: Secondary | ICD-10-CM

## 2024-01-08 DIAGNOSIS — I2510 Atherosclerotic heart disease of native coronary artery without angina pectoris: Secondary | ICD-10-CM

## 2024-01-08 DIAGNOSIS — I10 Essential (primary) hypertension: Secondary | ICD-10-CM

## 2024-01-08 MED ORDER — SPIRONOLACTONE 25 MG PO TABS: 12.5000 mg | ORAL_TABLET | Freq: Every day | ORAL | 3 refills | Status: AC

## 2024-01-08 MED ORDER — ATORVASTATIN CALCIUM 40 MG PO TABS: 40.0000 mg | ORAL_TABLET | Freq: Every day | ORAL | 3 refills | Status: AC

## 2024-01-08 NOTE — Patient Instructions (Addendum)
 Medication Instructions:  Atorvastatin 40 mg daily Sprionolactone 12.5 mg daily  *If you need a refill on your cardiac medications before your next appointment, please call your pharmacy*  Lab Work: BMET lab work in 1 week BMET lab work in 2 weeks  Testing/Procedures: NONE ordered at this time of appointment   Follow-Up: At [facility], you and your health needs are our priority.  As part of our continuing mission to provide you with exceptional heart care, our providers are all part of one team.  This team includes your primary Cardiologist (physician) and Advanced Practice Providers or APPs (Physician Assistants and Nurse Practitioners) who all work together to provide you with the care you need, when you need it.  Your next appointment:    As needed  We recommend signing up for the patient portal called MyChart.  Sign up information is provided on this After Visit Summary.  MyChart is used to connect with patients for Virtual Visits (Telemedicine).  Patients are able to view lab/test results, encounter notes, upcoming appointments, etc.  Non-urgent messages can be sent to your provider as well.   To learn more about what you can do with MyChart, go to [email or web address].   Other Instructions Establish care with [facility] [phone]). If anything changes with your insurance, please see our office back in 3 months.

## 2024-01-08 NOTE — Assessment & Plan Note (Signed)
 Mild pulmonary hypertension likely related to L heart disease (Group 2). As noted, echocardiogram should be repeated once GDMT maximized. This can be done at [facility] or, if her insurance changes, here.

## 2024-01-08 NOTE — Progress Notes (Signed)
 OFFICE NOTE:    Date:  01/08/2024  ID:  Eriona D Etzkorn, DOB 1960-04-10, MRN [id number] PCP: Vernadine Charlie ORN, MD  [facility] HeartCare Providers Cardiologist:  None        Pulmonary hypertension  TTE 04/18/13: EF 55-60, no RWMA, NL RVSF TTE 09/25/23: EF 50-55, no RWMA, NL RVSF, mildly elevated PASP, RVSP 36.9, trivial MR, RAP 15  Chest CT 10/2023: No pulmonary embolism  Coronary artery disease  CCTA 11/2023: CAC score 0, small <25% noncalcific plaque ostial D2, irregularities in LAD, irregularities in OM1 Leg edema Hyperlipidemia Hypertension History of DVT, pulmonary embolism 2015 Gout       Discussed the use of AI scribe software for clinical note transcription with the patient, who gave verbal consent to proceed. History of Present Illness Brandi Rivas is a 63 y.o. female for follow up on shortness of breath, edema.   She was evaluated for shortness of breath, lower extremity edema in July 2025.  She has a prior history of right leg DVT and pulmonary embolism.  She is no longer on anticoagulation.  Recent venous duplex was negative for DVT.  Echocardiogram demonstrated mild pulmonary hypertension with RVSP 36.9 and low normal ejection fraction.  BNP was normal.  Current dose of furosemide was continued.  Chest CT was arranged and ruled out pulmonary embolism.  Coronary CTA was arranged to rule out CAD as a cause for shortness of breath.  This demonstrated no significant CAD with calcium  score of 0 and small <25% noncalcific plaque in the ostial D2, irregularities in the LAD and OM1. Plan is to repeat her echocardiogram in 6 mos to recheck EF, RVSP.   She experiences chronic shortness of breath, particularly with exertion. Walking uphill or engaging in physical activities often leaves her out of breath and requires the use of an inhaler. She sometimes needs to stop multiple times during walks due to shortness of breath. She uses multiple pillows at night to aid breathing,  though she has not tried sleeping without them. Occasionally, shortness of breath wakes her up at night, but she can usually return to sleep by turning over. No history of syncope. She uses an albuterol  inhaler for shortness of breath but has not been diagnosed with asthma or COPD. No lung function tests have been performed. She experiences edema, which she describes as 'real puffy' on some days. She wears compression stockings regularly and has been on Lasix for a while, which she believes has helped reduce the swelling.    Of note, she showed me a note from her PCP. There was concern that her EF is 39%. Her echocardiogram showed her EF is 50-55%. The 3D images suggested EF 49%. Her EF is low normal.     ROS-See HPI     Studies Reviewed:       LABS 01/16/23 ([facility]): TSH 0.950, DDimer neg, BNP < 50 08/19/23: K 4, SCr 0.80, NT Pro BNP 46         Physical Exam:  VS:  BP 118/78 (BP Location: Left Arm, Patient Position: Sitting, Cuff Size: Large)   Pulse 79   Ht 5' 6 (1.676 m)   Wt 243 lb (110.2 kg)   SpO2 98%   BMI 39.22 kg/m        Wt Readings from Last 3 Encounters:  01/08/24 243 lb (110.2 kg)  08/19/23 240 lb 3.2 oz (109 kg)  06/17/19 232 lb (105.2 kg)    Constitutional:  Appearance: Healthy appearance. Not in distress.  Neck:     Vascular: JVR present. JVD normal.  Pulmonary:     Breath sounds: Normal breath sounds. No wheezing. No rales.  Cardiovascular:     Normal rate. Regular rhythm.     Murmurs: There is no murmur.  Edema:    Peripheral edema present.    Pretibial: bilateral trace edema of the pretibial area. Abdominal:     Palpations: Abdomen is soft.       Assessment and Plan:    Assessment & Plan Chronic heart failure with preserved ejection fraction (HFpEF) (HCC) Lower extremity edema Shortness of breath Echocardiogram demonstrated low normal EF (50-55). BNP was normal. However, I suspect she has an element of HFpEF. Overall, I think her shortness  of breath is multifactorial and related to prior PE, CHF, obesity, arthritis. She has some mild evidence of volume overload on exam. We discussed advancing GDMT for HFpEF. I think we could try to increase her Furosemide vs add MRA vs add SGLT2i. I favor adding Spironolactone . Of note, her insurance does not cover her being seen here. She will need to establish with [facility] going forward.  - Continue Metoprolol  succinate 25 mg once daily, Losartan 50 mg once daily  - Start Spironolactone  12.5 mg once daily  - BMET weekly x 2 - Consider SGLT2i at follow up. - She can arrange follow up here in 3 mos if something changes with her insurance. Otherwise, she will establish with [facility] - If she follows up with us , I will have her see Dr. Loni.  - Plan is to repeat her Echocardiogram in 6 mos once GDMT maximized.  Other secondary pulmonary hypertension (HCC) Mild pulmonary hypertension likely related to L heart disease (Group 2). As noted, echocardiogram should be repeated once GDMT maximized. This can be done at [facility] or, if her insurance changes, here.  Essential hypertension The patient's blood pressure is controlled on her current regimen.  Continue current therapy.   Coronary artery disease involving native coronary artery of native heart without angina pectoris Coronary CTA showed small, noncalcified plaque in D2 and irregularities in LAD and OM1. No significant obstruction or symptoms attributed to coronary artery disease. Calcium  score of zero. It would be reasonable to take mod intensity statin to get LDL < 70. - Start atorvastatin  40 mg once daily  - She will need to get fasting lipids and LFTs with primary care or her new cardiology group in several mos.         Dispo:  Return in about 3 months (around 04/07/2024) for w/ Glendia Ferrier, PA-C if insurance coverage changes (or [facility]).  Signed, Glendia Ferrier, PA-C

## 2024-01-10 ENCOUNTER — Telehealth: Payer: Self-pay | Admitting: Licensed Clinical Social Worker

## 2024-01-10 NOTE — Telephone Encounter (Signed)
 H&V Care Navigation CSW Progress Note  Clinical Social Worker completed chart review as pt noted to be self pay during recent visit. Now populating as [employer] OON. Remain available should any additional questions/concerns arise. Note pt has been informed previously that insurance is OON.  Patient is participating in a Managed Medicaid Plan:  No, commercial BCBS  SDOH Screenings   Tobacco Use: Low Risk  (01/08/2024)    Brandi Rivas, MSW, LCSW Clinical Social Worker II [facility] Heart/Vascular Care Navigation  [phone]- work cell phone (preferred)

## 2024-01-22 ENCOUNTER — Ambulatory Visit: Payer: Self-pay | Admitting: Physician Assistant

## 2024-01-22 LAB — BASIC METABOLIC PANEL WITH GFR
BUN/Creatinine Ratio: 25 (ref 12–28)
BUN: 23 mg/dL (ref 8–27)
CO2: 26 mmol/L (ref 20–29)
Calcium: 9.7 mg/dL (ref 8.7–10.3)
Chloride: 99 mmol/L (ref 96–106)
Creatinine, Ser: 0.91 mg/dL (ref 0.57–1.00)
Glucose: 74 mg/dL (ref 70–99)
Potassium: 4.7 mmol/L (ref 3.5–5.2)
Sodium: 139 mmol/L (ref 134–144)
eGFR: 71 mL/min/1.73 (ref >59)

## 2024-02-05 LAB — BASIC METABOLIC PANEL WITH GFR
BUN/Creatinine Ratio: 22 (ref 12–28)
BUN: 19 mg/dL (ref 8–27)
CO2: 25 mmol/L (ref 20–29)
Calcium: 9.4 mg/dL (ref 8.7–10.3)
Chloride: 106 mmol/L (ref 96–106)
Creatinine, Ser: 0.87 mg/dL (ref 0.57–1.00)
Glucose: 80 mg/dL (ref 70–99)
Potassium: 4.1 mmol/L (ref 3.5–5.2)
Sodium: 145 mmol/L — ABNORMAL HIGH (ref 134–144)
eGFR: 75 mL/min/1.73
